# Patient Record
Sex: Male | Born: 1960 | Race: White | Hispanic: No | Marital: Single | Smoking: Former smoker
Health system: Southern US, Community
[De-identification: ages and names within clinical notes are randomized; demographics above are authoritative.]

## PROBLEM LIST (undated history)

## (undated) DIAGNOSIS — H269 Unspecified cataract: Secondary | ICD-10-CM

## (undated) DIAGNOSIS — D649 Anemia, unspecified: Secondary | ICD-10-CM

## (undated) DIAGNOSIS — Z87438 Personal history of other diseases of male genital organs: Secondary | ICD-10-CM

## (undated) DIAGNOSIS — I10 Essential (primary) hypertension: Secondary | ICD-10-CM

## (undated) DIAGNOSIS — T7840XA Allergy, unspecified, initial encounter: Secondary | ICD-10-CM

## (undated) DIAGNOSIS — K5792 Diverticulitis of intestine, part unspecified, without perforation or abscess without bleeding: Secondary | ICD-10-CM

## (undated) DIAGNOSIS — R51 Headache: Secondary | ICD-10-CM

## (undated) DIAGNOSIS — I071 Rheumatic tricuspid insufficiency: Secondary | ICD-10-CM

## (undated) DIAGNOSIS — G8929 Other chronic pain: Secondary | ICD-10-CM

## (undated) DIAGNOSIS — J45909 Unspecified asthma, uncomplicated: Secondary | ICD-10-CM

## (undated) DIAGNOSIS — G2581 Restless legs syndrome: Secondary | ICD-10-CM

## (undated) DIAGNOSIS — E785 Hyperlipidemia, unspecified: Secondary | ICD-10-CM

## (undated) DIAGNOSIS — M1990 Unspecified osteoarthritis, unspecified site: Secondary | ICD-10-CM

## (undated) DIAGNOSIS — R519 Headache, unspecified: Secondary | ICD-10-CM

## (undated) DIAGNOSIS — K219 Gastro-esophageal reflux disease without esophagitis: Secondary | ICD-10-CM

## (undated) HISTORY — PX: INGUINAL HERNIA REPAIR: SHX194

## (undated) HISTORY — DX: Unspecified asthma, uncomplicated: J45.909

## (undated) HISTORY — DX: Essential (primary) hypertension: I10

## (undated) HISTORY — PX: TENDON REPAIR: SHX5111

## (undated) HISTORY — DX: Other chronic pain: G89.29

## (undated) HISTORY — DX: Unspecified cataract: H26.9

## (undated) HISTORY — DX: Allergy, unspecified, initial encounter: T78.40XA

## (undated) HISTORY — DX: Diverticulitis of intestine, part unspecified, without perforation or abscess without bleeding: K57.92

## (undated) HISTORY — DX: Personal history of other diseases of male genital organs: Z87.438

## (undated) HISTORY — DX: Headache, unspecified: R51.9

## (undated) HISTORY — DX: Hyperlipidemia, unspecified: E78.5

## (undated) HISTORY — DX: Gastro-esophageal reflux disease without esophagitis: K21.9

## (undated) HISTORY — PX: TONSILLECTOMY: SUR1361

## (undated) HISTORY — PX: COLONOSCOPY: SHX174

## (undated) HISTORY — DX: Headache: R51

---

## 2000-04-09 ENCOUNTER — Emergency Department (HOSPITAL_COMMUNITY): Admission: EM | Admit: 2000-04-09 | Discharge: 2000-04-09 | Payer: Self-pay | Admitting: *Deleted

## 2000-04-23 ENCOUNTER — Encounter: Payer: Self-pay | Admitting: Internal Medicine

## 2000-04-23 ENCOUNTER — Emergency Department (HOSPITAL_COMMUNITY): Admission: EM | Admit: 2000-04-23 | Discharge: 2000-04-23 | Payer: Self-pay | Admitting: Internal Medicine

## 2001-06-01 ENCOUNTER — Emergency Department (HOSPITAL_COMMUNITY): Admission: EM | Admit: 2001-06-01 | Discharge: 2001-06-01 | Payer: Self-pay | Admitting: *Deleted

## 2003-12-17 ENCOUNTER — Encounter: Admission: RE | Admit: 2003-12-17 | Discharge: 2003-12-25 | Payer: Self-pay | Admitting: Occupational Medicine

## 2004-08-20 ENCOUNTER — Ambulatory Visit: Payer: Self-pay | Admitting: Internal Medicine

## 2005-06-21 ENCOUNTER — Emergency Department (HOSPITAL_COMMUNITY): Admission: EM | Admit: 2005-06-21 | Discharge: 2005-06-21 | Payer: Self-pay | Admitting: *Deleted

## 2005-07-21 ENCOUNTER — Ambulatory Visit: Payer: Self-pay | Admitting: Internal Medicine

## 2005-08-04 ENCOUNTER — Ambulatory Visit: Payer: Self-pay | Admitting: Emergency Medicine

## 2005-08-10 ENCOUNTER — Encounter: Admission: RE | Admit: 2005-08-10 | Discharge: 2005-08-10 | Payer: Self-pay | Admitting: Occupational Medicine

## 2005-08-31 ENCOUNTER — Ambulatory Visit: Payer: Self-pay | Admitting: Internal Medicine

## 2005-09-08 ENCOUNTER — Ambulatory Visit: Payer: Self-pay | Admitting: Internal Medicine

## 2006-01-06 ENCOUNTER — Ambulatory Visit: Payer: Self-pay | Admitting: Internal Medicine

## 2006-01-30 ENCOUNTER — Ambulatory Visit: Payer: Self-pay | Admitting: Gastroenterology

## 2006-08-17 ENCOUNTER — Ambulatory Visit: Payer: Self-pay | Admitting: Internal Medicine

## 2006-08-30 ENCOUNTER — Ambulatory Visit: Payer: Self-pay | Admitting: Gastroenterology

## 2006-09-22 ENCOUNTER — Ambulatory Visit: Payer: Self-pay | Admitting: Internal Medicine

## 2006-09-26 ENCOUNTER — Ambulatory Visit: Payer: Self-pay | Admitting: Gastroenterology

## 2006-12-28 ENCOUNTER — Encounter: Admission: RE | Admit: 2006-12-28 | Discharge: 2006-12-28 | Payer: Self-pay | Admitting: Internal Medicine

## 2006-12-28 ENCOUNTER — Ambulatory Visit: Payer: Self-pay | Admitting: Internal Medicine

## 2007-03-02 ENCOUNTER — Ambulatory Visit: Payer: Self-pay | Admitting: Internal Medicine

## 2007-06-12 ENCOUNTER — Ambulatory Visit: Payer: Self-pay | Admitting: Internal Medicine

## 2007-06-12 ENCOUNTER — Ambulatory Visit (HOSPITAL_COMMUNITY): Admission: RE | Admit: 2007-06-12 | Discharge: 2007-06-12 | Payer: Self-pay | Admitting: Internal Medicine

## 2007-06-25 ENCOUNTER — Encounter: Payer: Self-pay | Admitting: Internal Medicine

## 2007-06-25 DIAGNOSIS — J309 Allergic rhinitis, unspecified: Secondary | ICD-10-CM | POA: Insufficient documentation

## 2007-06-25 DIAGNOSIS — J45909 Unspecified asthma, uncomplicated: Secondary | ICD-10-CM | POA: Insufficient documentation

## 2007-06-25 DIAGNOSIS — J4520 Mild intermittent asthma, uncomplicated: Secondary | ICD-10-CM | POA: Insufficient documentation

## 2007-06-25 DIAGNOSIS — R51 Headache: Secondary | ICD-10-CM | POA: Insufficient documentation

## 2007-06-25 DIAGNOSIS — Z9889 Other specified postprocedural states: Secondary | ICD-10-CM | POA: Insufficient documentation

## 2007-06-25 DIAGNOSIS — Z87898 Personal history of other specified conditions: Secondary | ICD-10-CM | POA: Insufficient documentation

## 2007-06-25 DIAGNOSIS — R519 Headache, unspecified: Secondary | ICD-10-CM | POA: Insufficient documentation

## 2007-08-20 ENCOUNTER — Telehealth: Payer: Self-pay | Admitting: Internal Medicine

## 2007-08-20 DIAGNOSIS — IMO0002 Reserved for concepts with insufficient information to code with codable children: Secondary | ICD-10-CM | POA: Insufficient documentation

## 2007-08-21 ENCOUNTER — Telehealth: Payer: Self-pay | Admitting: Internal Medicine

## 2007-08-22 ENCOUNTER — Telehealth: Payer: Self-pay | Admitting: Internal Medicine

## 2007-08-27 ENCOUNTER — Telehealth: Payer: Self-pay | Admitting: Internal Medicine

## 2007-08-28 ENCOUNTER — Ambulatory Visit (HOSPITAL_COMMUNITY): Admission: RE | Admit: 2007-08-28 | Discharge: 2007-08-28 | Payer: Self-pay | Admitting: Internal Medicine

## 2007-08-29 ENCOUNTER — Encounter: Payer: Self-pay | Admitting: Internal Medicine

## 2007-08-30 ENCOUNTER — Telehealth: Payer: Self-pay | Admitting: Internal Medicine

## 2007-09-04 ENCOUNTER — Telehealth: Payer: Self-pay | Admitting: Internal Medicine

## 2007-10-13 ENCOUNTER — Encounter: Payer: Self-pay | Admitting: Internal Medicine

## 2007-10-30 ENCOUNTER — Ambulatory Visit: Payer: Self-pay | Admitting: Internal Medicine

## 2007-10-30 DIAGNOSIS — M542 Cervicalgia: Secondary | ICD-10-CM | POA: Insufficient documentation

## 2008-05-02 ENCOUNTER — Telehealth: Payer: Self-pay | Admitting: Internal Medicine

## 2008-06-11 ENCOUNTER — Telehealth: Payer: Self-pay | Admitting: Internal Medicine

## 2008-09-02 ENCOUNTER — Telehealth: Payer: Self-pay | Admitting: Internal Medicine

## 2008-09-03 ENCOUNTER — Ambulatory Visit: Payer: Self-pay | Admitting: Internal Medicine

## 2008-09-22 ENCOUNTER — Telehealth: Payer: Self-pay | Admitting: Internal Medicine

## 2009-07-28 ENCOUNTER — Ambulatory Visit: Payer: Self-pay | Admitting: Internal Medicine

## 2010-05-12 ENCOUNTER — Telehealth: Payer: Self-pay | Admitting: Internal Medicine

## 2010-11-09 NOTE — Progress Notes (Signed)
Summary: RFs  Phone Note Refill Request   Refills Requested: Medication #1:  HYDROCODONE-ACETAMINOPHEN 10-325 MG TABS Take 1/2 tablet by mouth once a day as needed  Medication #2:  MIDRIN 325-65-100 MG  CAPS Take as needed Pharm from [facility] - [phone]  Initial call taken by: Lamar Sprinkles, CMA,  May 12, 2010 12:06 PM  Follow-up for Phone Call        1 hydrocodone/APAP # 60 1 refill  2 ok for as needed refills on midrin Follow-up by: Jacques Navy MD,  May 12, 2010 3:41 PM  Additional Follow-up for Phone Call Additional follow up Details #1::        Called into prescription pad Additional Follow-up by: Lamar Sprinkles, CMA,  May 12, 2010 4:42 PM    Prescriptions: HYDROCODONE-ACETAMINOPHEN 10-325 MG TABS (HYDROCODONE-ACETAMINOPHEN) Take 1/2 tablet by mouth once a day as needed  #60 x 1   Entered by:   Lamar Sprinkles, CMA   Authorized by:   Jacques Navy MD   Signed by:   Lamar Sprinkles, CMA on 05/12/2010   Method used:   Telephoned to ...       [facility]  [address] [phone]* (retail)       [address]       [place], [place]  [address]       Ph: [phone] or [phone]       Fax: [phone]   RxID:   [id number] MIDRIN 325-65-100 MG  CAPS (APAP-ISOMETHEPTENE-DICHLORAL) Take as needed  #45 x 12   Entered by:   Lamar Sprinkles, CMA   Authorized by:   Jacques Navy MD   Signed by:   Lamar Sprinkles, CMA on 05/12/2010   Method used:   Telephoned to ...       [facility]  [address] [phone]* (retail)       [address]       [place], [place]  [address]       Ph: [phone] or [phone]       Fax: [phone]   RxID:   [id number]

## 2011-02-15 ENCOUNTER — Other Ambulatory Visit (HOSPITAL_COMMUNITY): Payer: Self-pay | Admitting: Orthopedic Surgery

## 2011-02-15 DIAGNOSIS — S46919A Strain of unspecified muscle, fascia and tendon at shoulder and upper arm level, unspecified arm, initial encounter: Secondary | ICD-10-CM

## 2011-02-15 DIAGNOSIS — M75100 Unspecified rotator cuff tear or rupture of unspecified shoulder, not specified as traumatic: Secondary | ICD-10-CM

## 2011-02-22 ENCOUNTER — Other Ambulatory Visit (HOSPITAL_COMMUNITY): Payer: Self-pay | Admitting: Orthopedic Surgery

## 2011-02-22 ENCOUNTER — Ambulatory Visit (HOSPITAL_COMMUNITY)
Admission: RE | Admit: 2011-02-22 | Discharge: 2011-02-22 | Disposition: A | Discharge status: Home / Self Care | Payer: 59 | Source: Ambulatory Visit | Attending: Orthopedic Surgery | Admitting: Orthopedic Surgery

## 2011-02-22 DIAGNOSIS — S46919A Strain of unspecified muscle, fascia and tendon at shoulder and upper arm level, unspecified arm, initial encounter: Secondary | ICD-10-CM

## 2011-02-22 DIAGNOSIS — IMO0002 Reserved for concepts with insufficient information to code with codable children: Secondary | ICD-10-CM

## 2011-02-22 DIAGNOSIS — M67919 Unspecified disorder of synovium and tendon, unspecified shoulder: Secondary | ICD-10-CM | POA: Insufficient documentation

## 2011-02-22 DIAGNOSIS — X58XXXA Exposure to other specified factors, initial encounter: Secondary | ICD-10-CM | POA: Insufficient documentation

## 2011-02-22 DIAGNOSIS — M75100 Unspecified rotator cuff tear or rupture of unspecified shoulder, not specified as traumatic: Secondary | ICD-10-CM

## 2011-02-22 DIAGNOSIS — M25519 Pain in unspecified shoulder: Secondary | ICD-10-CM | POA: Insufficient documentation

## 2011-02-22 DIAGNOSIS — M24119 Other articular cartilage disorders, unspecified shoulder: Secondary | ICD-10-CM | POA: Insufficient documentation

## 2011-02-22 DIAGNOSIS — S46819A Strain of other muscles, fascia and tendons at shoulder and upper arm level, unspecified arm, initial encounter: Secondary | ICD-10-CM | POA: Insufficient documentation

## 2011-02-22 DIAGNOSIS — M719 Bursopathy, unspecified: Secondary | ICD-10-CM | POA: Insufficient documentation

## 2011-02-25 NOTE — Assessment & Plan Note (Signed)
[facility]                           PRIMARY CARE OFFICE NOTE   RANDIE, TALLARICO                    MRN:          [id number]  DATE:12/28/2006                            DOB:          May 03, 1961    Cancel this dictation because I keyed it wrong.  Sorry.     Rosalyn Gess Norins, MD  Electronically Signed    MEN/MedQ  DD: 12/28/2006  DT: 12/28/2006  Job #: [id number]

## 2011-02-25 NOTE — Letter (Signed)
Mar 03, 2007    Sanda Linger, CCT, CETT, Chiropodist Noninvasive  Cardiovascular Services  [facility]  [address]  [place], [place] [address]   RE:  [name], [name]  MRN:  [id number]  /  DOB:  1961/01/28   Dear Ms. Ebony Cargo,   I have seen Mr. Andre Turner in the office today.  He has a  longstanding problem with a pruritic skin condition.  He was recently  seen by Dr. Para Skeans from dermatology on January 23, 2007, and  diagnosed with having prurigo nodularis.  This is a condition that is  brought on by picking or scratching wounds on a repeated basis and is  not a communicable or infectious disease.   In the last couple of months, because of some erythema at some of these  wounds, the patient was treated with doxycycline, but no culture-  positive MRSA was determined.  At today's visit, one of his lesions,  which is on his proximal upper extremity, was cultured.  Those results  are pending.  The concern would be for a secondary infection of a  preexisting excoriated lesion.   At this point, I do not believe the patient has an active infection.  Certainly I will notify you as soon as I have culture results back  whether they are positive or negative.   For any questions, please do not hesitate to contact me.    Sincerely,      Rosalyn Gess. Norins, MD  Electronically Signed    MEN/MedQ  DD: 03/03/2007  DT: 03/03/2007  Job #: [id number]   CC:    Dr., [place], [place] [address] [address], [address]

## 2011-02-25 NOTE — Letter (Signed)
December 28, 2006    Dardan A. Terri Piedra, M.D.  [address] [place], [place] [address]   RE:  KALIQ, LEGE  MRN:  [id number]  /  DOB:  September 16, 1961   Dear Merlyn Albert,   Thank you very much for seeing Andre Turner, a 50 year old Caucasian  male, for persistent recurrent erythematous raised ulcerated lesions  that have been distributed over most of his body, including his arms,  chest, and lower extremities, but more pronounced on his buttocks.  The  patient has had low-grade fevers with these in the past, and I did  recently treat him with a course of doxycycline to be sure this was not  a MRSA infection.  Unfortunately, the patient continues to have problems  with these lesions that keep recurring.  He reports that they do become  painful.  They sometimes will spontaneously drain.  They develop an  ulcerated center.   PAST MEDICAL HISTORY:  1. The patient does have a history of migraine headache.  2. He has allergic rhinitis, with sensitivity to grasses.  3. He has reactive airway disease.  4. The patient has had knee surgery in 1995.   CURRENT MEDICATIONS:  1. AcipHex 20 mg daily.  2. Sudafed p.r.n.  3. Midrin p.r.n.  4. Hydrocodone p.r.n.  5. Albuterol MDI p.r.n.   PHYSICIAN ROSTER:  Leslye Peer, MD, for pulmonary, who has seen the  patient for his allergic rhinitis and reactive airway disease.   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  He is single.  He has not had known  TB exposure.  He did have PPD screening as required for all employees.   If I can provide any additional information in regard to Mr. Mooers,  please do not hesitate to contact me.   I very much appreciate your office working him into your schedule on  such short notice.    Sincerely,      Rosalyn Gess. Norins, MD  Electronically Signed    MEN/MedQ  DD: 12/28/2006  DT: 12/28/2006  Job #: [id number]   CC:    Fax to [phone]

## 2011-03-02 ENCOUNTER — Telehealth: Payer: Self-pay

## 2011-03-02 NOTE — Telephone Encounter (Signed)
[facility] Triage Call Report Triage Record Num: [id number] Operator: Sula Rumple Patient Name: Andre Turner Call Date & Time: 03/01/2011 5:47:08PM Patient Phone: [phone] PCP: Patient Gender: Male PCP Fax : Patient DOB: 07-23-1961 Practice Name: [facility] Reason for Call: Pt calling on 03/01/11 states he was to have a refill on Hydrocodone 10-325/ pt states he called last week/pt states the "Web site"states something was called in. Pt to have rotator cuff in 2 wks 03/24/11 . Advised no after hours narcotics ordered. Pt understands and may go to UC or ER. Protocol(s) Used: Medication Question Calls, No Triage (Adults) Recommended Outcome per Protocol: Provide Information or Advice Only Reason for Outcome: Caller has medication question only and triager answers question Care Advice: ~ 03/01/2011 5:52:52PM Page 1 of 1 [email or web address]

## 2011-03-24 ENCOUNTER — Ambulatory Visit (HOSPITAL_BASED_OUTPATIENT_CLINIC_OR_DEPARTMENT_OTHER)
Admission: RE | Admit: 2011-03-24 | Discharge: 2011-03-25 | Disposition: A | Discharge status: Home / Self Care | Payer: 59 | Source: Ambulatory Visit | Attending: Orthopedic Surgery | Admitting: Orthopedic Surgery

## 2011-03-24 DIAGNOSIS — M75120 Complete rotator cuff tear or rupture of unspecified shoulder, not specified as traumatic: Secondary | ICD-10-CM | POA: Insufficient documentation

## 2011-03-24 DIAGNOSIS — M24119 Other articular cartilage disorders, unspecified shoulder: Secondary | ICD-10-CM | POA: Insufficient documentation

## 2011-03-24 DIAGNOSIS — M25819 Other specified joint disorders, unspecified shoulder: Secondary | ICD-10-CM | POA: Insufficient documentation

## 2011-03-24 DIAGNOSIS — K219 Gastro-esophageal reflux disease without esophagitis: Secondary | ICD-10-CM | POA: Insufficient documentation

## 2011-03-24 LAB — POCT I-STAT, CHEM 8
Calcium, Ion: 1.13 mmol/L (ref 1.12–1.32)
Chloride: 105 mEq/L (ref 96–112)
Glucose, Bld: 85 mg/dL (ref 70–99)
HCT: 46 % (ref 39.0–52.0)
TCO2: 24 mmol/L (ref 0–100)

## 2011-03-31 NOTE — Op Note (Signed)
  NAMEMOUSA, PROUT NO.:  [id number]  MEDICAL RECORD NO.:  [id number]  LOCATION:                                 FACILITY:  PHYSICIAN:  Loreta Ave, M.D. DATE OF BIRTH:  [birth year]  DATE OF PROCEDURE:  03/24/2011 DATE OF DISCHARGE:                              OPERATIVE REPORT   PREOPERATIVE DIAGNOSES:  Right shoulder impingement, posterior labral tear.  Marked thinning probable full-thickness tear, rotator cuff.  POSTOPERATIVE DIAGNOSIS:  Right shoulder impingement, posterior labral tear.  Marked thinning probable full-thickness tear, rotator cuff with functional full-thickness cuff tear.  Anterior posterior labral tears. Grade 3 changes acromioclavicular joint.  PROCEDURE:  Right shoulder exam under anesthesia, arthroscopy. Debridement of rotator cuff of labrum.  Bursectomy, acromioplasty coracoacromial ligament release.  Excision of distal clavicle. Arthroscopic excision of rotator cuff repair.  Fiber weave suture x1 and SwiveLock anchors x1.  SURGEON:  Loreta Ave, MD  ASSISTANT:  Zonia Kief, PA present throughout the entire case and necessary for timely completion of the procedure.  ANESTHESIA:  General.  ESTIMATED BLOOD LOSS:  Minimal.  SPECIMEN:  None.  COUNTS:  None.  COMPLICATIONS:  None.  DRESSING:  Soft compressive with sling.  PROCEDURE IN DETAIL:  The patient was brought to the operating room and placed on the operating table in supine position.  After adequate anesthesia had been obtained, shoulder examined.  Full motion, stable shoulder.  Placed in beach-chair position on the shoulder positioner, prepped and draped in usual sterile fashion.  Three portals, anterior, posterior, and lateral.  Arthroscope introduced, shoulder distended and inspected.  Complex tearing, anterior and posterior labral debrided. Biceps tendon, biceps anchor still intact.  Undersurface of cuff appeared to be intact and very thin over the  anterior half of supraspinatus tendon.  Remaining cuff looked good.  From above, there was a functional full-thickness tear, anterior half supraspinatus tendon just down the capsular layer.  This was mobilized, debrided for repair. Tuberosity roughened.  Type 2 acromion and reactive bursitis.  Bursa resected.  Acromioplasty to a type 1 acromion with shaver and high-speed bur.  CA ligament released.  Grade 3 changes of distal clavicle. Periarticular spurs and lateral centimeter of clavicle resected. Adequacy of decompression confirmed and viewed throughout.  Through the lateral portal, a cannula was placed.  The mobilized cuff was captured through the horizontal mattress suture with a scorpion device.  This was then firmly implanted down in the tuberosity with a SwiveLock anchor. Nice firm watertight closure of the cuff was achieved.  Adequacy of decompression confirmed.  Instruments and fluid removed.  Portals closed with nylon.  Sterile compressive dressing applied.  Sling applied. Anesthesia reversed.  Brought to the recovery room.  Tolerated the surgery well.  No complications.     Loreta Ave, M.D.     DFM/MEDQ  D:  03/24/2011  T:  03/25/2011  Job:  [id number]  Electronically Signed by Mckinley Jewel M.D. on 03/31/2011 04:09:47 PM

## 2011-05-27 ENCOUNTER — Ambulatory Visit: Payer: 59 | Admitting: Internal Medicine

## 2011-06-02 ENCOUNTER — Encounter: Payer: Self-pay | Admitting: Internal Medicine

## 2011-06-02 ENCOUNTER — Ambulatory Visit (INDEPENDENT_AMBULATORY_CARE_PROVIDER_SITE_OTHER): Payer: 59 | Admitting: Internal Medicine

## 2011-06-02 DIAGNOSIS — L989 Disorder of the skin and subcutaneous tissue, unspecified: Secondary | ICD-10-CM

## 2011-06-02 DIAGNOSIS — Z13220 Encounter for screening for lipoid disorders: Secondary | ICD-10-CM

## 2011-06-02 DIAGNOSIS — N529 Male erectile dysfunction, unspecified: Secondary | ICD-10-CM

## 2011-06-02 DIAGNOSIS — Z125 Encounter for screening for malignant neoplasm of prostate: Secondary | ICD-10-CM

## 2011-06-02 DIAGNOSIS — R635 Abnormal weight gain: Secondary | ICD-10-CM

## 2011-06-02 LAB — CBC
HCT: 41.6 % (ref 39.0–52.0)
Hemoglobin: 13.8 g/dL (ref 13.0–17.0)
MCH: 32.2 pg (ref 26.0–34.0)
MCHC: 33.2 g/dL (ref 30.0–36.0)

## 2011-06-02 LAB — LIPID PANEL: Total CHOL/HDL Ratio: 6.6 Ratio

## 2011-06-02 LAB — HEPATIC FUNCTION PANEL
Bilirubin, Direct: 0.1 mg/dL (ref 0.0–0.3)
Total Bilirubin: 0.4 mg/dL (ref 0.3–1.2)

## 2011-06-02 LAB — PSA: PSA: 0.73 ng/mL (ref <=4.00)

## 2011-06-02 MED ORDER — DOXYCYCLINE HYCLATE 100 MG PO TABS: 100.0000 mg | ORAL_TABLET | Freq: Two times a day (BID) | ORAL | Status: AC

## 2011-06-02 MED ORDER — VARDENAFIL HCL 20 MG PO TABS: ORAL_TABLET | ORAL | Status: DC

## 2011-06-03 ENCOUNTER — Telehealth: Payer: Self-pay | Admitting: Internal Medicine

## 2011-06-03 DIAGNOSIS — E782 Mixed hyperlipidemia: Secondary | ICD-10-CM

## 2011-06-03 NOTE — Telephone Encounter (Signed)
Patient is requesting lab results.

## 2011-06-05 DIAGNOSIS — N529 Male erectile dysfunction, unspecified: Secondary | ICD-10-CM | POA: Insufficient documentation

## 2011-06-05 DIAGNOSIS — L989 Disorder of the skin and subcutaneous tissue, unspecified: Secondary | ICD-10-CM | POA: Insufficient documentation

## 2011-06-05 NOTE — Assessment & Plan Note (Signed)
?   Infectious etiology and with h/o mrsa attempt 2wk course of doxycycline. Consider dermatology re-evaluation if fails to improve.

## 2011-06-05 NOTE — Progress Notes (Signed)
  Subjective:    Patient ID: Andre Turner, male    DOB: 1960-11-03, 50 y.o.   MRN: [id number]  HPI Pt presents to clinic to establish care and for followup of multiple medical problems. Has h/o chronic intermittent erythematous papules located arms and buttocks. Seen by derm in the past and attempted unspecified creams without improvement. States h/o mrsa and believes the papules may have improved at one point with abx tx. S/p rotator cuff surgery in June and states is doing well. Father had prostate cancer and he is personally interested in screening. Believes last colonoscopy ?~5years ago and may be due for repeat but will need to obtain records for review. No abd pain or blood in stool. C/o inadequate erections with intact libido. Has attempted viagra in the past without adverse effect. No other complaint.  Past Medical History  Diagnosis Date  . Unspecified asthma   . Chronic headache   . Allergic rhinitis   . History of benign prostatic hypertrophy    Past Surgical History  Procedure Date  . Inguinal hernia repair   . Knee arthroscopy     reports that he has quit smoking. He has never used smokeless tobacco. He reports that he drinks alcohol. He reports that he does not use illicit drugs. family history includes Colon cancer in his maternal grandmother; Diabetes in his other; Heart failure in his father; Hypertension in his father; Leukemia in his father; Lung cancer in his mother; and Prostate cancer in his father and other. Allergies  Allergen Reactions  . Sulfonamide Derivatives     REACTION: causes nausea       Review of Systems see hpi     Objective:   Physical Exam  Physical Exam  Nursing note and vitals reviewed. Constitutional: Appears well-developed and well-nourished. No distress.  HENT:  Head: Normocephalic and atraumatic.  Right Ear: External ear normal.  Left Ear: External ear normal.  Eyes: Conjunctivae are normal. No scleral icterus.  Neck: Neck  supple. Carotid bruit is not present.  Cardiovascular: Normal rate, regular rhythm and normal heart sounds.  Exam reveals no gallop and no friction rub.   No murmur heard. Pulmonary/Chest: Effort normal and breath sounds normal. No respiratory distress. He has no wheezes. no rales.  Lymphadenopathy:    He has no cervical adenopathy.  Neurological:Alert.  Skin: Skin is warm and dry. Not diaphoretic. no recent lesions to exam. Older lesions appear as slightly raised papules slightly erythematous without drainage or tenderness. Psychiatric: Has a normal mood and affect.        Assessment & Plan:

## 2011-06-05 NOTE — Assessment & Plan Note (Signed)
Attempt levitra 20mg  prn. Dosing instructions provided.

## 2011-06-06 NOTE — Telephone Encounter (Signed)
Chol especially TG elevated. Low fat diet, regular exercise and daily fish oil may help. Repeat lipid in 3 months 272.4. If not much better then will discuss potential medication

## 2011-06-07 NOTE — Telephone Encounter (Signed)
Call placed to patient at [phone], no answer.  A detailed voice message was left informing patient per Dr Rodena Medin instruction. Lab order entered for [facility] for the month of November.

## 2011-06-08 ENCOUNTER — Telehealth: Payer: Self-pay | Admitting: Internal Medicine

## 2011-06-08 NOTE — Telephone Encounter (Signed)
Patient is requesting last uric acid and psa levels.

## 2011-06-09 NOTE — Telephone Encounter (Signed)
Notified most recent PSA of 06/02/11 was normal and we do not have record of any uric acid level. Pt voices understanding.

## 2011-06-23 ENCOUNTER — Telehealth: Payer: Self-pay | Admitting: Internal Medicine

## 2011-06-23 DIAGNOSIS — R12 Heartburn: Secondary | ICD-10-CM

## 2011-06-23 NOTE — Telephone Encounter (Signed)
Cannot find record within epic or centricity. ?

## 2011-06-23 NOTE — Telephone Encounter (Signed)
Patient called to let Dr Rodena Medin know that Dr Russella Dar with [facility] did his colonoscopy

## 2011-06-24 ENCOUNTER — Telehealth: Payer: Self-pay | Admitting: Internal Medicine

## 2011-06-24 NOTE — Telephone Encounter (Signed)
Patient states that he would like to be referred to Dr. Russella Dar for colonoscopy and endoscopy(?). Patient states that he has seen Dr. Russella Dar before for the colonoscopy.

## 2011-06-24 NOTE — Telephone Encounter (Signed)
Call placed to patient at [phone], no answer. A voice message was left for patient to return call regarding the date and location he had his colonoscopy performed.

## 2011-06-28 NOTE — Telephone Encounter (Signed)
EGD can't be ordered directly. Needs to be GI consult. Would generally recommend trying medication for 4 wks first if not taking (no med on chart). Omeprazole 40mg  po qhs. If doesn't resolve with medication then GI/EGD reasonable. Don't mind referring him for that or colonoscopy but can't find colon report in epic or centricity so can't verify he is due. Also GI typically contacts pt directly when due for repeat colon.

## 2011-06-28 NOTE — Telephone Encounter (Signed)
Patient returned phone call,stating his colonoscopy was done about 5-6 years ago by Dr Russella Dar.  He stated that he continues to have a burning sensation in his chest when he lays down and would like to have a endoscopy done .

## 2011-06-29 MED ORDER — OMEPRAZOLE 40 MG PO CPDR: 40.0000 mg | DELAYED_RELEASE_CAPSULE | Freq: Every day | ORAL | Status: DC

## 2011-06-29 NOTE — Telephone Encounter (Signed)
Call placed to patient [phone], he was advised per Dr Rodena Medin instructions, has verbalized understanding and agrees as instructed.  Rx sent to pharmacy.

## 2011-07-04 ENCOUNTER — Telehealth: Payer: Self-pay | Admitting: *Deleted

## 2011-07-04 ENCOUNTER — Other Ambulatory Visit: Payer: Self-pay | Admitting: Internal Medicine

## 2011-07-04 NOTE — Telephone Encounter (Signed)
Will schedule cardiology consult - ED if sx's worsen

## 2011-07-04 NOTE — Telephone Encounter (Signed)
Pt states he has noticed an increase in episodes of shortness of breath with some chest pain while he is walking and notes some wheezing during these episodes. Pt denies n/v, radiating pain or diaphoresis. Reports that symptoms resolve if he stops for a few minutes. States that these symptoms have been occurring x 6 months and seem to be more frequent. Pt states he had an echo cardiogram earlier this year. States that he is not able to get a disc but states the report was normal. 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 He is requesting a second opinion. Please advise.

## 2011-07-05 NOTE — Telephone Encounter (Signed)
Left detailed message on voicemail re: instructions below and to call if any questions. 

## 2011-07-05 NOTE — Telephone Encounter (Signed)
Left message on machine to return my call. 

## 2011-07-05 NOTE — Telephone Encounter (Signed)
Patient returned phone call. Best # [phone]

## 2011-07-06 ENCOUNTER — Ambulatory Visit: Payer: 59 | Admitting: Cardiology

## 2011-07-12 ENCOUNTER — Encounter: Payer: Self-pay | Admitting: Cardiology

## 2011-07-13 ENCOUNTER — Encounter: Payer: Self-pay | Admitting: Cardiology

## 2011-07-13 ENCOUNTER — Ambulatory Visit (INDEPENDENT_AMBULATORY_CARE_PROVIDER_SITE_OTHER)
Admission: RE | Admit: 2011-07-13 | Discharge: 2011-07-13 | Disposition: A | Discharge status: Home / Self Care | Payer: 59 | Source: Ambulatory Visit | Attending: Cardiology | Admitting: Cardiology

## 2011-07-13 ENCOUNTER — Encounter: Payer: Self-pay | Admitting: *Deleted

## 2011-07-13 ENCOUNTER — Ambulatory Visit (INDEPENDENT_AMBULATORY_CARE_PROVIDER_SITE_OTHER): Payer: 59 | Admitting: Cardiology

## 2011-07-13 DIAGNOSIS — I10 Essential (primary) hypertension: Secondary | ICD-10-CM | POA: Insufficient documentation

## 2011-07-13 DIAGNOSIS — R079 Chest pain, unspecified: Secondary | ICD-10-CM | POA: Insufficient documentation

## 2011-07-13 DIAGNOSIS — E785 Hyperlipidemia, unspecified: Secondary | ICD-10-CM

## 2011-07-13 DIAGNOSIS — Z01810 Encounter for preprocedural cardiovascular examination: Secondary | ICD-10-CM

## 2011-07-13 LAB — BASIC METABOLIC PANEL
BUN: 17 mg/dL (ref 6–23)
Calcium: 9 mg/dL (ref 8.4–10.5)
GFR: 76.61 mL/min (ref >60.00)
Glucose, Bld: 98 mg/dL (ref 70–99)
Potassium: 4 mEq/L (ref 3.5–5.1)

## 2011-07-13 LAB — CBC WITH DIFFERENTIAL/PLATELET
Basophils Relative: 1 % (ref 0.0–3.0)
Eosinophils Relative: 1.6 % (ref 0.0–5.0)
HCT: 42.2 % (ref 39.0–52.0)
Hemoglobin: 14.4 g/dL (ref 13.0–17.0)
Lymphs Abs: 2.8 10*3/uL (ref 0.7–4.0)
MCV: 97.9 fl (ref 78.0–100.0)
Monocytes Absolute: 0.6 10*3/uL (ref 0.1–1.0)
RBC: 4.31 Mil/uL (ref 4.22–5.81)
WBC: 7 10*3/uL (ref 4.5–10.5)

## 2011-07-13 NOTE — Assessment & Plan Note (Signed)
Blood pressure controlled. Continue present medications. 

## 2011-07-13 NOTE — Patient Instructions (Signed)
Your physician has requested that you have a cardiac catheterization. Cardiac catheterization is used to diagnose and/or treat various heart conditions. Doctors may recommend this procedure for a number of different reasons. The most common reason is to evaluate chest pain. Chest pain can be a symptom of coronary artery disease (CAD), and cardiac catheterization can show whether plaque is narrowing or blocking your heart's arteries. This procedure is also used to evaluate the valves, as well as measure the blood flow and oxygen levels in different parts of your heart. For further information please visit [email or web address] Please follow instruction sheet, as given.   Your physician recommends that you return for lab work in: today  A chest x-ray takes a picture of the organs and structures inside the chest, including the heart, lungs, and blood vessels. This test can show several things, including, whether the heart is enlarges; whether fluid is building up in the lungs; and whether pacemaker / defibrillator leads are still in place. AT [address]  START ASPIRIN 81 MG ONCE DAILY

## 2011-07-13 NOTE — Progress Notes (Signed)
HPI: 50 yo male with no prior cardiac history for evaluation of chest pain and dyspnea. Patient states that for the past 2 years he has had chest tightness. He notices this with exertion and is relieved with rest. There is also dyspnea on exertion. No orthopnea, PND or syncope. His pain does not radiate. There is no nausea but there is occasional diaphoresis. His symptoms have worsened. Because of the above we were asked to further evaluate.  Current Outpatient Prescriptions  Medication Sig Dispense Refill  . HYDROcodone-acetaminophen (NORCO) 5-325 MG per tablet Take 1 tablet by mouth every 8 (eight) hours as needed.        . hydrOXYzine (ATARAX) 10 MG tablet Take 10 mg by mouth every 8 (eight) hours as needed.        . isometheptene-acetaminophen-dichloralphenazone (MIDRIN) 65-325-100 MG capsule Take 1 capsule by mouth 4 (four) times daily as needed.        [name] omeprazole (PRILOSEC) 40 MG capsule Take 1 capsule (40 mg total) by mouth at bedtime.  30 capsule  1  . propranolol (INDERAL) 40 MG tablet Take 40 mg by mouth 2 (two) times daily.          Allergies  Allergen Reactions  . Celebrex (Celecoxib) Itching  . Sulfonamide Derivatives     REACTION: causes nausea    Past Medical History  Diagnosis Date  . Unspecified asthma   . Chronic headache   . Allergic rhinitis   . History of benign prostatic hypertrophy   . Hypertension   . Hyperlipidemia   . GERD (gastroesophageal reflux disease)     Past Surgical History  Procedure Date  . Inguinal hernia repair   . Knee arthroscopy   . Tonsillectomy     History   Social History  . Marital Status: Single    Spouse Name: N/A    Number of Children: N/A  . Years of Education: N/A   Occupational History  . Not on file.   Social History Main Topics  . Smoking status: Former Games developer  . Smokeless tobacco: Current User    Types: Chew   Comment: stopped at age 43  . Alcohol Use: Yes     2 drinks per day  . Drug Use: No  . Sexually  Active: Not on file   Other Topics Concern  . Not on file   Social History Narrative   HSG, working on college degree   single. Was employed at [employer] until December.    Family History  Problem Relation Age of Onset  . Prostate cancer Father   . Hypertension Father   . Heart failure Father   . Leukemia Father   . Lung cancer Mother   . Colon cancer Maternal Grandmother   . Diabetes Other     FH- maternal side  . Prostate cancer Other     FH-maternal side    ROS: no fevers or chills, productive cough, hemoptysis, dysphasia, odynophagia, melena, hematochezia, dysuria, hematuria, rash, seizure activity, orthopnea, PND, pedal edema, claudication. Remaining systems are negative.  Physical Exam: General:  Well developed/well nourished in NAD Skin warm/dry Patient not depressed No peripheral clubbing Back-normal HEENT-normal/normal eyelids Neck supple/normal carotid upstroke bilaterally; no bruits; no JVD; no thyromegaly chest - CTA/ normal expansion CV - RRR/normal S1 and S2; no murmurs, rubs or gallops;  PMI nondisplaced Abdomen -NT/ND, no HSM, no mass, + bowel sounds, no bruit 2+ femoral pulses, no bruits Ext-no edema, chords, 2+ DP Neuro-grossly nonfocal  ECG NSR  with no ST changes

## 2011-07-13 NOTE — Assessment & Plan Note (Signed)
Add statin and coronary disease demonstrated. Otherwise management per primary care.

## 2011-07-13 NOTE — Assessment & Plan Note (Signed)
Symptoms are concerning for angina. They have progressed. Feel definitive evaluation is warranted. Proceed with cardiac catheterization. Risks and benefits including but not limited to myocardial infarction, CVA and death discussed and patient agreed to proceed. Continue beta blocker. Add aspirin. If coronary artery disease demonstrated will add statin.

## 2011-07-14 NOTE — Progress Notes (Signed)
Addended by: Kem Parkinson on: 07/14/2011 03:30 PM   Modules accepted: Orders

## 2011-07-19 ENCOUNTER — Inpatient Hospital Stay (HOSPITAL_BASED_OUTPATIENT_CLINIC_OR_DEPARTMENT_OTHER)
Admission: RE | Admit: 2011-07-19 | Discharge: 2011-07-19 | Disposition: A | Discharge status: Home / Self Care | Payer: 59 | Source: Ambulatory Visit | Attending: Cardiology | Admitting: Cardiology

## 2011-07-19 ENCOUNTER — Telehealth: Payer: Self-pay | Admitting: Cardiology

## 2011-07-19 ENCOUNTER — Ambulatory Visit: Payer: 59 | Admitting: Cardiology

## 2011-07-19 DIAGNOSIS — E785 Hyperlipidemia, unspecified: Secondary | ICD-10-CM | POA: Insufficient documentation

## 2011-07-19 DIAGNOSIS — I10 Essential (primary) hypertension: Secondary | ICD-10-CM | POA: Insufficient documentation

## 2011-07-19 DIAGNOSIS — R079 Chest pain, unspecified: Secondary | ICD-10-CM

## 2011-07-19 DIAGNOSIS — I2510 Atherosclerotic heart disease of native coronary artery without angina pectoris: Secondary | ICD-10-CM | POA: Insufficient documentation

## 2011-07-19 DIAGNOSIS — R0789 Other chest pain: Secondary | ICD-10-CM | POA: Insufficient documentation

## 2011-07-19 DIAGNOSIS — R0989 Other specified symptoms and signs involving the circulatory and respiratory systems: Secondary | ICD-10-CM | POA: Insufficient documentation

## 2011-07-19 DIAGNOSIS — R0609 Other forms of dyspnea: Secondary | ICD-10-CM | POA: Insufficient documentation

## 2011-07-19 NOTE — Telephone Encounter (Signed)
Pt had a chest xray  2 wks ago and hasnt heard results please call

## 2011-07-19 NOTE — Cardiovascular Report (Signed)
  Andre Turner, Andre Turner NO.:  [id number]  MEDICAL RECORD NO.:  [id number]  LOCATION:                                 FACILITY:  PHYSICIAN:  Ciro Tashiro M. [place], M.D.  DATE OF BIRTH:  07-06-1961  DATE OF PROCEDURE:  07/19/2011 DATE OF DISCHARGE:                           CARDIAC CATHETERIZATION   INDICATION FOR PROCEDURE:  A 50 year old white male who presents with progressive symptoms of chest tightness on exertion associated with dyspnea.  He has a history of hypertension and hyperlipidemia.  PROCEDURES:  Left heart catheterization, coronary and left ventricular angiography.  ACCESS:  Via the right femoral artery using standard Seldinger technique.  EQUIPMENT:  4-French 4-cm left Judkins catheter, 4-French 3D RCA catheter, 4-French pigtail catheter, 4-French arterial sheath.  MEDICATIONS:  Local.  ANESTHESIA:  1% Xylocaine, Versed 2 mg IV.  CONTRAST:  80 mL of Omnipaque.  HEMODYNAMIC DATA:  Aortic pressure was 103/66 with a mean of 85 mmHg. Left ventricular pressure was 99 with an EDP of 18 mmHg.  ANGIOGRAPHIC DATA:  The left coronary artery arises and distributes normally.  The left main coronary artery is normal.  Left anterior descending artery is normal.  Left circumflex coronary artery is normal.  The right coronary artery is a large dominant vessel.  It has 20% narrowing in the midvessel.  Otherwise, it is without significant disease.  Left ventricular angiography was performed in the RAO view.  This demonstrates normal left ventricular size and contractility with normal systolic function.  Ejection fraction was estimated at 60%.  FINAL INTERPRETATION: 1. Minimal nonobstructive atherosclerotic coronary artery disease in     the right coronary artery. 2. Normal left ventricular function.  PLAN:  Continue medical therapy.          ______________________________ Aily Tzeng M. [place], M.D.     PMJ/MEDQ  D:  07/19/2011  T:  07/19/2011   Job:  [id number]  cc:   Madolyn Frieze. Jens Som, MD, [facility] [address], MD  Electronically Signed by Poseidon Pam [place] M.D. on 07/19/2011 01:04:52 PM

## 2011-07-21 NOTE — Telephone Encounter (Signed)
Spoke with pt, aware of xray report Andre Turner

## 2011-07-26 ENCOUNTER — Encounter: Payer: Self-pay | Admitting: *Deleted

## 2011-08-03 ENCOUNTER — Encounter: Payer: Self-pay | Admitting: Family

## 2011-08-03 ENCOUNTER — Ambulatory Visit (INDEPENDENT_AMBULATORY_CARE_PROVIDER_SITE_OTHER): Payer: 59 | Admitting: Family

## 2011-08-03 ENCOUNTER — Ambulatory Visit (HOSPITAL_BASED_OUTPATIENT_CLINIC_OR_DEPARTMENT_OTHER)
Admission: RE | Admit: 2011-08-03 | Discharge: 2011-08-03 | Disposition: A | Discharge status: Home / Self Care | Payer: 59 | Source: Ambulatory Visit | Attending: Family | Admitting: Family

## 2011-08-03 ENCOUNTER — Ambulatory Visit: Payer: 59 | Admitting: Family

## 2011-08-03 VITALS — BP 110/76 | HR 81 | Temp 98.0°F | Resp 18 | Ht 68.0 in | Wt 197.1 lb

## 2011-08-03 DIAGNOSIS — R509 Fever, unspecified: Secondary | ICD-10-CM | POA: Insufficient documentation

## 2011-08-03 DIAGNOSIS — R059 Cough, unspecified: Secondary | ICD-10-CM

## 2011-08-03 DIAGNOSIS — J209 Acute bronchitis, unspecified: Secondary | ICD-10-CM | POA: Insufficient documentation

## 2011-08-03 DIAGNOSIS — R05 Cough: Secondary | ICD-10-CM

## 2011-08-03 MED ORDER — PREDNISONE 10 MG PO TABS: ORAL_TABLET | ORAL | Status: DC

## 2011-08-03 MED ORDER — ALBUTEROL SULFATE HFA 108 (90 BASE) MCG/ACT IN AERS: 2.0000 | INHALATION_SPRAY | Freq: Four times a day (QID) | RESPIRATORY_TRACT | Status: DC | PRN

## 2011-08-03 MED ORDER — ALBUTEROL SULFATE (2.5 MG/3ML) 0.083% IN NEBU
2.5000 mg | INHALATION_SOLUTION | Freq: Once | RESPIRATORY_TRACT | Status: AC
  Administered 2011-08-03: 2.5 mg via RESPIRATORY_TRACT

## 2011-08-03 MED ORDER — DOXYCYCLINE HYCLATE 100 MG PO CAPS: 100.0000 mg | ORAL_CAPSULE | Freq: Two times a day (BID) | ORAL | Status: DC

## 2011-08-03 NOTE — Progress Notes (Signed)
Subjective:    Patient ID: Andre Turner, male    DOB: [birth year], 50 y.o.   MRN: [id number]  HPI  Andre Turner is a 50 yr old male with chief complaint of cough. Cough is productive of "bad tasting" green sputum.  Symptoms started 2.5 days ago and are associated with fever, sneezing, ear pain, malaise, nausea, wheezing/shortness of breath, green nasal discharge and sore throat. Has tried Cepacol, Vodka shot, salt water gargles, advil cold/sinus, antihistamine/decongestant, and his dog's cephalexin ?250mg , without improvement.     Review of Systems See HPI  Past Medical History  Diagnosis Date  . Unspecified asthma   . Chronic headache   . Allergic rhinitis   . History of benign prostatic hypertrophy   . Hypertension   . Hyperlipidemia   . GERD (gastroesophageal reflux disease)     History   Social History  . Marital Status: Single    Spouse Name: N/A    Number of Children: N/A  . Years of Education: N/A   Occupational History  . Not on file.   Social History Main Topics  . Smoking status: Former Games developer  . Smokeless tobacco: Current User    Types: Chew   Comment: stopped at age 1  . Alcohol Use: Yes     2 drinks per day  . Drug Use: No  . Sexually Active: Not on file   Other Topics Concern  . Not on file   Social History Narrative   HSG, working on college degree   single. Was employed at [employer] until December.    Past Surgical History  Procedure Date  . Inguinal hernia repair   . Knee arthroscopy   . Tonsillectomy     Family History  Problem Relation Age of Onset  . Prostate cancer Father   . Hypertension Father   . Heart failure Father   . Leukemia Father   . Lung cancer Mother   . Colon cancer Maternal Grandmother   . Diabetes Other     FH- maternal side  . Prostate cancer Other     FH-maternal side    Allergies  Allergen Reactions  . Celebrex (Celecoxib) Itching  . Sulfonamide Derivatives     REACTION: causes nausea    Current  Outpatient Prescriptions on File Prior to Visit  Medication Sig Dispense Refill  . HYDROcodone-acetaminophen (NORCO) 5-325 MG per tablet Take 1 tablet by mouth every 8 (eight) hours as needed.        . hydrOXYzine (ATARAX) 10 MG tablet Take 10 mg by mouth every 8 (eight) hours as needed.        . isometheptene-acetaminophen-dichloralphenazone (MIDRIN) 65-325-100 MG capsule Take 1 capsule by mouth 4 (four) times daily as needed.        . propranolol (INDERAL) 40 MG tablet Take 40 mg by mouth 2 (two) times daily.        [name] omeprazole (PRILOSEC) 40 MG capsule Take 1 capsule (40 mg total) by mouth at bedtime.  30 capsule  1    BP 110/76  Pulse 81  Temp(Src) 98 F (36.7 C) (Oral)  Resp 18  Ht 5\' 8"  (1.727 m)  Wt 197 lb 1.3 oz (89.395 kg)  BMI 29.97 kg/m2  SpO2 96%       Objective:   Physical Exam  Constitutional: He appears well-developed and well-nourished. No distress.       Tired appearing white male in NAD  HENT:  Right Ear: Tympanic membrane and ear canal  normal.  Left Ear: Ear canal normal.  Mouth/Throat: Uvula is midline, oropharynx is clear and moist and mucous membranes are normal.  Cardiovascular: Normal rate and regular rhythm.   No murmur heard. Pulmonary/Chest:       Faint expiratory wheeze. +coughing, no increased work of breathing.  Lymphadenopathy:    He has no cervical adenopathy.  Skin: Skin is warm and dry.  Psychiatric: He has a normal mood and affect. His behavior is normal. Judgment and thought content normal.          Assessment & Plan:

## 2011-08-03 NOTE — Assessment & Plan Note (Signed)
Chest x-ray performed today is negative. He was given an albuterol neb today which he tells me did help.  Will plan to treat with doxy for a full 10 days for bronchitis as well as sinus coverage.  He will also be given a prednisone taper as I believe that bronchospasm is playing a role in his coughing and breathing tightness.  He is instructed to follow up with Dr. Rodena Medin in 1 week.

## 2011-08-03 NOTE — Patient Instructions (Signed)
Please call if you have recurrent fever >101, if symptoms worsen, or if you are not feeling better in 2-3 days.

## 2011-08-09 ENCOUNTER — Ambulatory Visit: Payer: 59 | Admitting: Internal Medicine

## 2011-08-09 ENCOUNTER — Encounter: Payer: Self-pay | Admitting: Internal Medicine

## 2011-08-09 ENCOUNTER — Ambulatory Visit (INDEPENDENT_AMBULATORY_CARE_PROVIDER_SITE_OTHER): Payer: 59 | Admitting: Internal Medicine

## 2011-08-09 DIAGNOSIS — J40 Bronchitis, not specified as acute or chronic: Secondary | ICD-10-CM | POA: Insufficient documentation

## 2011-08-09 MED ORDER — LEVOFLOXACIN 500 MG PO TABS: 500.0000 mg | ORAL_TABLET | Freq: Every day | ORAL | Status: DC

## 2011-08-09 NOTE — Assessment & Plan Note (Signed)
Improved but not resolved. Attempt second course of abx with levaquin 500mg  po qd x 7d. Followup if no improvement or worsening.

## 2011-08-09 NOTE — Progress Notes (Signed)
  Subjective:    Patient ID: Andre Turner, male    DOB: [birth year], 50 y.o.   MRN: [id number]  HPI Pt presents to clinic for evaluation of cough. Seen 10/4 with productive cough. Has completed 10d course of doxycycline with some improvement but no resolution. CXR at the time without acute dz. Currently has cough productive for green sputum and intermittent streaky hemoptysis. No gross active bleeding. Mild DOE. Intermittent fevers. Using albuterol mdi prn. No other alleviating or exacerbating factors. No other complaints.  Past Medical History  Diagnosis Date  . Unspecified asthma   . Chronic headache   . Allergic rhinitis   . History of benign prostatic hypertrophy   . Hypertension   . Hyperlipidemia   . GERD (gastroesophageal reflux disease)    Past Surgical History  Procedure Date  . Inguinal hernia repair   . Knee arthroscopy   . Tonsillectomy     reports that he has quit smoking. His smokeless tobacco use includes Chew. He reports that he drinks alcohol. He reports that he does not use illicit drugs. family history includes Colon cancer in his maternal grandmother; Diabetes in his other; Heart failure in his father; Hypertension in his father; Leukemia in his father; Lung cancer in his mother; and Prostate cancer in his father and other. Allergies  Allergen Reactions  . Celebrex (Celecoxib) Itching  . Sulfonamide Derivatives     REACTION: causes nausea     Review of Systems see hpi     Objective:   Physical Exam  Nursing note and vitals reviewed. Constitutional: He appears well-developed and well-nourished. No distress.  HENT:  Head: Normocephalic and atraumatic.  Right Ear: External ear normal.  Left Ear: External ear normal.  Nose: Nose normal.  Mouth/Throat: Oropharynx is clear and moist. No oropharyngeal exudate.  Eyes: Conjunctivae are normal. No scleral icterus.  Neck: Neck supple.  Cardiovascular: Normal rate, regular rhythm and normal heart sounds.  Exam  reveals no gallop and no friction rub.   No murmur heard. Pulmonary/Chest: Effort normal and breath sounds normal. No respiratory distress. He has no wheezes. He has no rales.  Lymphadenopathy:    He has no cervical adenopathy.  Neurological: He is alert.  Skin: Skin is warm and dry. He is not diaphoretic.  Psychiatric: He has a normal mood and affect.          Assessment & Plan:

## 2011-08-16 ENCOUNTER — Ambulatory Visit (INDEPENDENT_AMBULATORY_CARE_PROVIDER_SITE_OTHER): Payer: 59 | Admitting: Cardiology

## 2011-08-16 ENCOUNTER — Encounter: Payer: Self-pay | Admitting: Cardiology

## 2011-08-16 DIAGNOSIS — E785 Hyperlipidemia, unspecified: Secondary | ICD-10-CM

## 2011-08-16 DIAGNOSIS — R079 Chest pain, unspecified: Secondary | ICD-10-CM

## 2011-08-16 DIAGNOSIS — I10 Essential (primary) hypertension: Secondary | ICD-10-CM

## 2011-08-16 NOTE — Assessment & Plan Note (Signed)
Management per primary care. 

## 2011-08-16 NOTE — Progress Notes (Signed)
HPI:50 yo male with no prior cardiac history I initially saw in Oct 2012 for evaluation of chest pain and dyspnea. Cardiac cath in Oct of 2012 revealed Normal LV function and a 20% RCA and no other disease; since that time, the patient denies any dyspnea on exertion, orthopnea, PND, pedal edema, palpitations, syncope or chest pain.   Current Outpatient Prescriptions  Medication Sig Dispense Refill  . albuterol (PROVENTIL HFA;VENTOLIN HFA) 108 (90 BASE) MCG/ACT inhaler Inhale 2 puffs into the lungs every 6 (six) hours as needed for wheezing.  1 Inhaler  0  . Cod Liver Oil CAPS Take 1 capsule by mouth daily.        . hydrOXYzine (ATARAX) 10 MG tablet Take 10 mg by mouth every 8 (eight) hours as needed.        . isometheptene-acetaminophen-dichloralphenazone (MIDRIN) 65-325-100 MG capsule Take 1 capsule by mouth 4 (four) times daily as needed.        . propranolol (INDERAL) 40 MG tablet 1/2 tablet by mouth twice a day         Past Medical History  Diagnosis Date  . Unspecified asthma   . Chronic headache   . Allergic rhinitis   . History of benign prostatic hypertrophy   . Hypertension   . Hyperlipidemia   . GERD (gastroesophageal reflux disease)     Past Surgical History  Procedure Date  . Inguinal hernia repair   . Knee arthroscopy   . Tonsillectomy     History   Social History  . Marital Status: Single    Spouse Name: N/A    Number of Children: N/A  . Years of Education: N/A   Occupational History  . Not on file.   Social History Main Topics  . Smoking status: Former Games developer  . Smokeless tobacco: Current User    Types: Chew   Comment: stopped at age 2  . Alcohol Use: Yes     2 drinks per day  . Drug Use: No  . Sexually Active: Not on file   Other Topics Concern  . Not on file   Social History Narrative   HSG, working on college degree   single. Was employed at [employer] until December.    ROS: no fevers or chills, productive cough, hemoptysis, dysphasia,  odynophagia, melena, hematochezia, dysuria, hematuria, rash, seizure activity, orthopnea, PND, pedal edema, claudication. Remaining systems are negative.  Physical Exam: Well-developed well-nourished in no acute distress.  Skin is warm and dry.  HEENT is normal.  Neck is supple. No thyromegaly.  Chest is clear to auscultation with normal expansion.  Cardiovascular exam is regular rate and rhythm.  Abdominal exam nontender or distended. No masses palpated. Right groin with no hematoma and no bruit Extremities show no edema. neuro grossly intact

## 2011-08-16 NOTE — Assessment & Plan Note (Signed)
No further chest pain. Catheterization revealed no obstructive coronary disease. No further workup.

## 2011-08-16 NOTE — Assessment & Plan Note (Signed)
Blood pressure controlled. Continue present medications. 

## 2011-10-19 ENCOUNTER — Encounter: Payer: Self-pay | Admitting: Gastroenterology

## 2011-12-26 ENCOUNTER — Telehealth: Payer: Self-pay | Admitting: *Deleted

## 2011-12-26 NOTE — Telephone Encounter (Signed)
Patient called and left voice message requesting a Rx his respiratory infection.   Call was returned to patient at  [phone]. Patient states he has had wheezing, fever 101-102, sob ,productive cough gray in color for the past few days. Patient was advised to schedule office visit for evaluation. He states that he can not make an appointment for today. He has scheduled for Tuesday 12/27/2011. He stated that if he is unable to keep scheduled appointment he will call back in the morning.

## 2011-12-27 ENCOUNTER — Encounter: Payer: Self-pay | Admitting: Internal Medicine

## 2011-12-27 ENCOUNTER — Ambulatory Visit (INDEPENDENT_AMBULATORY_CARE_PROVIDER_SITE_OTHER): Payer: 59 | Admitting: Internal Medicine

## 2011-12-27 VITALS — BP 104/60 | HR 80 | Temp 97.7°F | Resp 18 | Wt 179.0 lb

## 2011-12-27 DIAGNOSIS — J40 Bronchitis, not specified as acute or chronic: Secondary | ICD-10-CM

## 2011-12-27 MED ORDER — AMOXICILLIN-POT CLAVULANATE 875-125 MG PO TABS: 1.0000 | ORAL_TABLET | Freq: Two times a day (BID) | ORAL | Status: DC

## 2011-12-27 NOTE — Assessment & Plan Note (Signed)
Begin augmentin course to completion. Followup if no improvement or worsening.

## 2011-12-27 NOTE — Progress Notes (Signed)
  Subjective:    Patient ID: Andre Turner, male    DOB: December 07, 1960, 51 y.o.   MRN: [id number]  HPI Pt presents to clinic for evaluation of cough. Notes almost 2wk h/o cough, fever with tmax 101.5 yesterday, and chest congestion. Took aleve for fever. No other alleviating or exacerbating factors. No other complaints.  Past Medical History  Diagnosis Date  . Unspecified asthma   . Chronic headache   . Allergic rhinitis   . History of benign prostatic hypertrophy   . Hypertension   . Hyperlipidemia   . GERD (gastroesophageal reflux disease)    Past Surgical History  Procedure Date  . Inguinal hernia repair   . Knee arthroscopy   . Tonsillectomy     reports that he has quit smoking. His smokeless tobacco use includes Chew. He reports that he drinks alcohol. He reports that he does not use illicit drugs. family history includes Colon cancer in his maternal grandmother; Diabetes in his other; Heart failure in his father; Hypertension in his father; Leukemia in his father; Lung cancer in his mother; and Prostate cancer in his father and other. Allergies  Allergen Reactions  . Celebrex (Celecoxib) Itching  . Sulfonamide Derivatives     REACTION: causes nausea     Review of Systems see hpi     Objective:   Physical Exam  Nursing note and vitals reviewed. Constitutional: He appears well-developed and well-nourished. No distress.  HENT:  Head: Normocephalic and atraumatic.  Right Ear: Tympanic membrane, external ear and ear canal normal.  Left Ear: Tympanic membrane, external ear and ear canal normal.  Nose: Nose normal.  Mouth/Throat: Oropharynx is clear and moist. No oropharyngeal exudate.  Eyes: Conjunctivae are normal. Right eye exhibits no discharge. Left eye exhibits no discharge. No scleral icterus.  Neck: Neck supple.  Cardiovascular: Normal rate, regular rhythm and normal heart sounds.   Pulmonary/Chest: Effort normal and breath sounds normal. No respiratory distress.  He has no wheezes. He has no rales.  Lymphadenopathy:    He has no cervical adenopathy.  Neurological: He is alert.  Skin: Skin is warm and dry. He is not diaphoretic.  Psychiatric: He has a normal mood and affect.          Assessment & Plan:

## 2012-01-03 ENCOUNTER — Telehealth: Payer: Self-pay | Admitting: *Deleted

## 2012-01-03 MED ORDER — AMOXICILLIN-POT CLAVULANATE 875-125 MG PO TABS: 1.0000 | ORAL_TABLET | Freq: Two times a day (BID) | ORAL | Status: AC

## 2012-01-03 NOTE — Telephone Encounter (Signed)
Call placed to patient at [phone], he was informed per Dr Rodena Medin instructions and has verbalized understanding to return if no improvement.

## 2012-01-03 NOTE — Telephone Encounter (Signed)
Patient called and left voice message stating he was asked to call back with a status update. He states he still has congestion ,orange nasal drainage, and his fever is gone.

## 2012-01-03 NOTE — Telephone Encounter (Signed)
Can repeat abx course. F/u appt if that doesn't work

## 2012-04-02 ENCOUNTER — Other Ambulatory Visit: Payer: Self-pay | Admitting: Internal Medicine

## 2012-04-02 MED ORDER — ISOMETHEPTENE-APAP-DICHLORAL 65-325-100 MG PO CAPS: 1.0000 | ORAL_CAPSULE | Freq: Four times a day (QID) | ORAL | Status: DC | PRN

## 2012-04-02 NOTE — Telephone Encounter (Signed)
Patient called and left voice message requesting a refill on midrin and hydrocodone. If approved he is requesting refill to [facility].

## 2012-04-02 NOTE — Telephone Encounter (Signed)
midrin #30 rf 3. Cannot see that the narcotic has been prescribed here. i know had shoulder surgery but that was a year ago. Last two visits were for bronchitis.

## 2012-04-02 NOTE — Telephone Encounter (Signed)
Call placed to patient at [phone], he was informed per Dr Rodena Medin instructions. He was made aware Rx for Hydrocodone was denied. No further action required.

## 2012-07-10 ENCOUNTER — Encounter: Payer: Self-pay | Admitting: Gastroenterology

## 2012-12-06 ENCOUNTER — Ambulatory Visit (INDEPENDENT_AMBULATORY_CARE_PROVIDER_SITE_OTHER): Payer: PRIVATE HEALTH INSURANCE | Admitting: Family Medicine

## 2012-12-06 VITALS — BP 123/67 | HR 89 | Temp 99.9°F | Resp 16 | Ht 68.5 in | Wt 156.0 lb

## 2012-12-06 DIAGNOSIS — J322 Chronic ethmoidal sinusitis: Secondary | ICD-10-CM

## 2012-12-06 MED ORDER — AMOXICILLIN 875 MG PO TABS: 875.0000 mg | ORAL_TABLET | Freq: Two times a day (BID) | ORAL | Status: DC

## 2012-12-06 NOTE — Patient Instructions (Signed)
Take the Amoxicillin twice a day for 10 days.  Take with food.  YOu should start feeling better in about 24 or 48 hours.   If you're still having trouble in a week or so let us know.

## 2012-12-06 NOTE — Progress Notes (Signed)
Andre Turner is a 52 y.o. male who presents to Urgent Care today with complaints of headache and nasal congestion:  1.  Nasal congestion/headache:  Present since Tuesday.  Had fever to 102 at home on that day.  Nasal congestion since that time as well as headaches and facial tenderness.  Describes tenderness across bilateral ethmoid and maxillary sinuses, worse ethmoid.  Has been taking Alleve every 12 hours since then, has chills when medicine wears off.  No sore throat, does have some pain that radiates to back of neck.  No stiff neck.  No sick contacts.  No cough.   PMH reviewed.  Past Medical History  Diagnosis Date  . Unspecified asthma   . Chronic headache   . Allergic rhinitis   . History of benign prostatic hypertrophy   . Hypertension   . Hyperlipidemia   . GERD (gastroesophageal reflux disease)    Past Surgical History  Procedure Laterality Date  . Inguinal hernia repair    . Knee arthroscopy    . Tonsillectomy      Medications reviewed. Current Outpatient Prescriptions  Medication Sig Dispense Refill  . Calcium-Magnesium-Vitamin D (CALCIUM MAGNESIUM PO) Take by mouth.      [name] amoxicillin (AMOXIL) 875 MG tablet Take 1 tablet (875 mg total) by mouth 2 (two) times daily.  20 tablet  0  . [DISCONTINUED] propranolol (INDERAL) 40 MG tablet 1/2 tablet by mouth twice a day       No current facility-administered medications for this visit.    ROS as above otherwise neg.  No chest pain, palpitations, SOB, Fever, Chills, Abd pain, N/V/D.   Physical Exam:  BP 123/67  Pulse 89  Temp(Src) 99.9 F (37.7 C)  Resp 16  Ht 5' 8.5" (1.74 m)  Wt 156 lb (70.761 kg)  BMI 23.37 kg/m2  SpO2 98% Gen:  Patient sitting on exam table, appears stated age in no acute distress Head: Normocephalic atraumatic Eyes: EOMI, PERRL, sclera and conjunctiva non-erythematous Ears:  Canals clear bilaterally.  TMs pearly gray bilaterally without erythema or bulging.   Nose:  Nasal turbinates  grossly enlarged bilaterally. Some exudates noted. Tender to palpation of ethmoid sinuses bilaterally Mouth: Mucosa membranes moist. Tonsils +2, nonenlarged, non-erythematous. Neck: No cervical lymphadenopathy noted.  Supple and nontender.   Heart:  RRR, no murmurs auscultated. Pulm:  Clear to auscultation bilaterally with good air movement.  No wheezes or rales noted.      Assessment and Plan:  1.  Sinusitis: - most likely etiology, along with fever and sinus congestion.  Corroborated by physical examination - FU in 7 days if no improvement, sooner if worsening. - Amoxillin x 10 days.

## 2013-12-12 ENCOUNTER — Ambulatory Visit: Payer: Self-pay | Admitting: Emergency Medicine

## 2013-12-12 VITALS — BP 110/62 | HR 68 | Temp 98.2°F | Resp 18 | Ht 67.5 in | Wt 159.0 lb

## 2013-12-12 DIAGNOSIS — Z0289 Encounter for other administrative examinations: Secondary | ICD-10-CM

## 2013-12-12 LAB — POCT CBC
GRANULOCYTE PERCENT: 47.7 % (ref 37–80)
HCT, POC: 39.8 % — AB (ref 43.5–53.7)
HEMOGLOBIN: 12.6 g/dL — AB (ref 14.1–18.1)
Lymph, poc: 2.9 (ref 0.6–3.4)
MCH: 30.8 pg (ref 27–31.2)
MCHC: 31.7 g/dL — AB (ref 31.8–35.4)
MCV: 97.2 fL — AB (ref 80–97)
MID (CBC): 0.5 (ref 0–0.9)
MPV: 9.4 fL (ref 0–99.8)
PLATELET COUNT, POC: 248 10*3/uL (ref 142–424)
POC Granulocyte: 3.1 (ref 2–6.9)
POC LYMPH PERCENT: 45.2 %L (ref 10–50)
POC MID %: 7.1 % (ref 0–12)
RBC: 4.09 M/uL — AB (ref 4.69–6.13)
RDW, POC: 14 %
WBC: 6.4 10*3/uL (ref 4.6–10.2)

## 2013-12-12 NOTE — Progress Notes (Signed)
Sp gr-1.010, protein-neg, glucose-neg, blood-neg

## 2013-12-12 NOTE — Progress Notes (Signed)
[facility] [address], [place] [address] [phone]  Date:  12/12/2013   Name:  Andre Turner   DOB:  1961-03-01   MRN:  [id number]  PCP:  Letitia Libra, Ala Dach, MD    Chief Complaint: Annual Exam   History of Present Illness:  Andre Turner is a 53 y.o. very pleasant male patient who presents with the following:  For physical examination.  Denies other complaint or health concern today.   Patient Active Problem List   Diagnosis Date Noted  . Bronchitis 08/09/2011  . Chest pain 07/13/2011  . Hypertension 07/13/2011  . Hyperlipidemia 07/13/2011  . Skin lesions, generalized 06/05/2011  . Erectile dysfunction 06/05/2011  . NECK PAIN 10/30/2007  . BACK PAIN WITH RADICULOPATHY 08/20/2007  . ALLERGIC RHINITIS 06/25/2007  . REACTIVE AIRWAY DISEASE 06/25/2007  . HEADACHE, CHRONIC 06/25/2007  . BENIGN PROSTATIC HYPERTROPHY, HX OF 06/25/2007  . Other Postprocedural Status 06/25/2007    Past Medical History  Diagnosis Date  . Unspecified asthma(493.90)   . Chronic headache   . Allergic rhinitis   . History of benign prostatic hypertrophy   . Hypertension   . Hyperlipidemia   . GERD (gastroesophageal reflux disease)     Past Surgical History  Procedure Laterality Date  . Inguinal hernia repair    . Knee arthroscopy    . Tonsillectomy      History  Substance Use Topics  . Smoking status: Former Games developer  . Smokeless tobacco: Current User    Types: Chew     Comment: stopped at age 14  . Alcohol Use: Yes     Comment: 2 drinks per day    Family History  Problem Relation Age of Onset  . Prostate cancer Father   . Hypertension Father   . Heart failure Father   . Leukemia Father   . Lung cancer Mother   . Obesity Mother   . Colon cancer Maternal Grandmother   . Diabetes Other     FH- maternal side  . Prostate cancer Other     FH-maternal side    Allergies  Allergen Reactions  . Celebrex [Celecoxib] Itching  .  Sulfonamide Derivatives     REACTION: causes nausea    Medication list has been reviewed and updated.  Current Outpatient Prescriptions on File Prior to Visit  Medication Sig Dispense Refill  . Calcium-Magnesium-Vitamin D (CALCIUM MAGNESIUM PO) Take by mouth.      [name] amoxicillin (AMOXIL) 875 MG tablet Take 1 tablet (875 mg total) by mouth 2 (two) times daily.  20 tablet  0  . [DISCONTINUED] propranolol (INDERAL) 40 MG tablet 1/2 tablet by mouth twice a day       No current facility-administered medications on file prior to visit.    Review of Systems:  As per HPI, otherwise negative.    Physical Examination: Filed Vitals:   12/12/13 1837  BP: 110/62  Pulse: 68  Temp: 98.2 Andre (36.8 C)  Resp: 18   Filed Vitals:   12/12/13 1837  Height: 5' 7.5" (1.715 m)  Weight: 159 lb (72.122 kg)   Body mass index is 24.52 kg/(m^2). Ideal Body Weight: Weight in (lb) to have BMI = 25: 161.7  GEN: WDWN, NAD, Non-toxic, A & O x 3 HEENT: Atraumatic, Normocephalic. Neck supple. No masses, No LAD. Ears and Nose: No external deformity. CV: RRR, No M/G/R. No JVD. No thrill. No extra heart sounds. PULM: CTA B, no wheezes, crackles, rhonchi.  No retractions. No resp. distress. No accessory muscle use. ABD: S, NT, ND, +BS. No rebound. No HSM. EXTR: No c/c/e NEURO Normal gait.  PSYCH: Normally interactive. Conversant. Not depressed or anxious appearing.  Calm demeanor.    Assessment and Plan: Wellness examination   Signed,  Phillips Odor, MD

## 2013-12-27 ENCOUNTER — Encounter: Payer: 59 | Admitting: Family

## 2015-01-31 ENCOUNTER — Emergency Department (HOSPITAL_COMMUNITY)
Admission: EM | Admit: 2015-01-31 | Discharge: 2015-01-31 | Disposition: A | Discharge status: Home / Self Care | Payer: No Typology Code available for payment source | Attending: Emergency Medicine | Admitting: Emergency Medicine

## 2015-01-31 ENCOUNTER — Encounter (HOSPITAL_COMMUNITY): Payer: Self-pay | Admitting: Nurse Practitioner

## 2015-01-31 DIAGNOSIS — I10 Essential (primary) hypertension: Secondary | ICD-10-CM | POA: Diagnosis not present

## 2015-01-31 DIAGNOSIS — Z8719 Personal history of other diseases of the digestive system: Secondary | ICD-10-CM | POA: Insufficient documentation

## 2015-01-31 DIAGNOSIS — Y92410 Unspecified street and highway as the place of occurrence of the external cause: Secondary | ICD-10-CM | POA: Diagnosis not present

## 2015-01-31 DIAGNOSIS — Y998 Other external cause status: Secondary | ICD-10-CM | POA: Diagnosis not present

## 2015-01-31 DIAGNOSIS — M79605 Pain in left leg: Secondary | ICD-10-CM

## 2015-01-31 DIAGNOSIS — Y9389 Activity, other specified: Secondary | ICD-10-CM | POA: Diagnosis not present

## 2015-01-31 DIAGNOSIS — Z87891 Personal history of nicotine dependence: Secondary | ICD-10-CM | POA: Diagnosis not present

## 2015-01-31 DIAGNOSIS — S8992XA Unspecified injury of left lower leg, initial encounter: Secondary | ICD-10-CM | POA: Diagnosis present

## 2015-01-31 DIAGNOSIS — M62838 Other muscle spasm: Secondary | ICD-10-CM

## 2015-01-31 DIAGNOSIS — Z792 Long term (current) use of antibiotics: Secondary | ICD-10-CM | POA: Insufficient documentation

## 2015-01-31 DIAGNOSIS — S8012XA Contusion of left lower leg, initial encounter: Secondary | ICD-10-CM | POA: Insufficient documentation

## 2015-01-31 DIAGNOSIS — J45909 Unspecified asthma, uncomplicated: Secondary | ICD-10-CM | POA: Diagnosis not present

## 2015-01-31 DIAGNOSIS — Z8639 Personal history of other endocrine, nutritional and metabolic disease: Secondary | ICD-10-CM | POA: Diagnosis not present

## 2015-01-31 MED ORDER — NAPROXEN 500 MG PO TABS: 500.0000 mg | ORAL_TABLET | Freq: Two times a day (BID) | ORAL | Status: DC | PRN

## 2015-01-31 MED ORDER — CYCLOBENZAPRINE HCL 10 MG PO TABS: 10.0000 mg | ORAL_TABLET | Freq: Three times a day (TID) | ORAL | Status: DC | PRN

## 2015-01-31 MED ORDER — NAPROXEN 250 MG PO TABS
500.0000 mg | ORAL_TABLET | Freq: Once | ORAL | Status: AC
  Administered 2015-01-31: 500 mg via ORAL
  Filled 2015-01-31: qty 2

## 2015-01-31 MED ORDER — CYCLOBENZAPRINE HCL 10 MG PO TABS
5.0000 mg | ORAL_TABLET | Freq: Once | ORAL | Status: AC
  Administered 2015-01-31: 5 mg via ORAL
  Filled 2015-01-31: qty 1

## 2015-01-31 NOTE — Discharge Instructions (Signed)
Take naprosyn as directed for inflammation and pain with tylenol for breakthrough pain and flexeril for muscle relaxation. Do not drive or operate machinery with muscle relaxation use. Use heat to areas of soreness, no more than 20 minutes at a time for each. Expect to be sore for the next few days and follow up with primary care physician for recheck of ongoing symptoms in 1-2 weeks. Return to ER for emergent changing or worsening of symptoms.     Contusion A contusion is a deep bruise. Contusions happen when an injury causes bleeding under the skin. Signs of bruising include pain, puffiness (swelling), and discolored skin. The contusion may turn blue, purple, or yellow. HOME CARE   Put ice on the injured area.  Put ice in a plastic bag.  Place a towel between your skin and the bag.  Leave the ice on for 15-20 minutes, 03-04 times a day.  Only take medicine as told by your doctor.  Rest the injured area.  If possible, raise (elevate) the injured area to lessen puffiness. GET HELP RIGHT AWAY IF:   You have more bruising or puffiness.  You have pain that is getting worse.  Your puffiness or pain is not helped by medicine. MAKE SURE YOU:   Understand these instructions.  Will watch your condition.  Will get help right away if you are not doing well or get worse. Document Released: 03/14/2008 Document Revised: 12/19/2011 Document Reviewed: 08/01/2011 [facility] Patient Information 2015 [place], [place]. This information is not intended to replace advice given to you by your health care provider. Make sure you discuss any questions you have with your health care provider.  Heat Therapy Heat therapy can help ease sore, stiff, injured, and tight muscles and joints. Heat relaxes your muscles, which may help ease your pain.  RISKS AND COMPLICATIONS If you have any of the following conditions, do not use heat therapy unless your health care provider has approved:  Poor  circulation.  Healing wounds or scarred skin in the area being treated.  Diabetes, heart disease, or high blood pressure.  Not being able to feel (numbness) the area being treated.  Unusual swelling of the area being treated.  Active infections.  Blood clots.  Cancer.  Inability to communicate pain. This may include young children and people who have problems with their brain function (dementia).  Pregnancy. Heat therapy should only be used on old, pre-existing, or long-lasting (chronic) injuries. Do not use heat therapy on new injuries unless directed by your health care provider. HOW TO USE HEAT THERAPY There are several different kinds of heat therapy, including:  Moist heat pack.  Warm water bath.  Hot water bottle.  Electric heating pad.  Heated gel pack.  Heated wrap.  Electric heating pad. Use the heat therapy method suggested by your health care provider. Follow your health care provider's instructions on when and how to use heat therapy. GENERAL HEAT THERAPY RECOMMENDATIONS  Do not sleep while using heat therapy. Only use heat therapy while you are awake.  Your skin may turn pink while using heat therapy. Do not use heat therapy if your skin turns red.  Do not use heat therapy if you have new pain.  High heat or long exposure to heat can cause burns. Be careful when using heat therapy to avoid burning your skin.  Do not use heat therapy on areas of your skin that are already irritated, such as with a rash or sunburn. SEEK MEDICAL CARE IF:  You have blisters, redness, swelling, or numbness.  You have new pain.  Your pain is worse. MAKE SURE YOU:  Understand these instructions.  Will watch your condition.  Will get help right away if you are not doing well or get worse. Document Released: 12/19/2011 Document Revised: 02/10/2014 Document Reviewed: 11/19/2013 [facility] Patient Information 2015 [place], [place]. This information is not intended to  replace advice given to you by your health care provider. Make sure you discuss any questions you have with your health care provider.  Motor Vehicle Collision It is common to have multiple bruises and sore muscles after a motor vehicle collision (MVC). These tend to feel worse for the first 24 hours. You may have the most stiffness and soreness over the first several hours. You may also feel worse when you wake up the first morning after your collision. After this point, you will usually begin to improve with each day. The speed of improvement often depends on the severity of the collision, the number of injuries, and the location and nature of these injuries. HOME CARE INSTRUCTIONS  Put ice on the injured area.  Put ice in a plastic bag.  Place a towel between your skin and the bag.  Leave the ice on for 15-20 minutes, 3-4 times a day, or as directed by your health care provider.  Drink enough fluids to keep your urine clear or pale yellow. Do not drink alcohol.  Take a warm shower or bath once or twice a day. This will increase blood flow to sore muscles.  You may return to activities as directed by your caregiver. Be careful when lifting, as this may aggravate neck or back pain.  Only take over-the-counter or prescription medicines for pain, discomfort, or fever as directed by your caregiver. Do not use aspirin. This may increase bruising and bleeding. SEEK IMMEDIATE MEDICAL CARE IF:  You have numbness, tingling, or weakness in the arms or legs.  You develop severe headaches not relieved with medicine.  You have severe neck pain, especially tenderness in the middle of the back of your neck.  You have changes in bowel or bladder control.  There is increasing pain in any area of the body.  You have shortness of breath, light-headedness, dizziness, or fainting.  You have chest pain.  You feel sick to your stomach (nauseous), throw up (vomit), or sweat.  You have increasing  abdominal discomfort.  There is blood in your urine, stool, or vomit.  You have pain in your shoulder (shoulder strap areas).  You feel your symptoms are getting worse. MAKE SURE YOU:  Understand these instructions.  Will watch your condition.  Will get help right away if you are not doing well or get worse. Document Released: 09/26/2005 Document Revised: 02/10/2014 Document Reviewed: 02/23/2011 [employer] Patient Information 2015 [place], [place]. This information is not intended to replace advice given to you by your health care provider. Make sure you discuss any questions you have with your health care provider.
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
## 2016-07-04 ENCOUNTER — Encounter (HOSPITAL_BASED_OUTPATIENT_CLINIC_OR_DEPARTMENT_OTHER): Payer: Self-pay | Admitting: *Deleted

## 2016-07-04 NOTE — Progress Notes (Signed)
Pt coming in Wednesday for MRSA swab and EKG. Told him to pack overnight bag for RCC and Bring all medications

## 2016-07-06 ENCOUNTER — Other Ambulatory Visit: Payer: Self-pay | Admitting: Orthopedic Surgery

## 2016-07-06 ENCOUNTER — Encounter (HOSPITAL_BASED_OUTPATIENT_CLINIC_OR_DEPARTMENT_OTHER)
Admission: RE | Admit: 2016-07-06 | Discharge: 2016-07-06 | Disposition: A | Discharge status: Home / Self Care | Payer: BLUE CROSS/BLUE SHIELD | Source: Ambulatory Visit | Attending: Orthopedic Surgery | Admitting: Orthopedic Surgery

## 2016-07-06 DIAGNOSIS — I10 Essential (primary) hypertension: Secondary | ICD-10-CM | POA: Insufficient documentation

## 2016-07-06 LAB — SURGICAL PCR SCREEN
MRSA, PCR: NEGATIVE
Staphylococcus aureus: NEGATIVE

## 2016-07-07 NOTE — H&P (Signed)
ORTHOPAEDIC HISTORY & PHYSICAL  Andre Turner MRN:  [id number] DOB/SEX:  [birth year]/male  CHIEF COMPLAINT:  Painful left knee  HISTORY: Patient is a 55 y.o. male presented with a history of pain in the left knee for 6 months. Onset of symptoms was gradual starting 7 months ago with gradually worsening course since that time. Prior procedures on the knee are steroid injection x 2. Patient has been treated conservatively with over-the-counter NSAIDs and activity modification. Patient currently rates pain at 8 out of 10 with activity. There is pain at night. present.  They have been previously treated with: NSAIDS: Steriods with mild improvement    PAST MEDICAL HISTORY: Patient Active Problem List   Diagnosis Date Noted  . Bronchitis 08/09/2011  . Chest pain 07/13/2011  . Hypertension 07/13/2011  . Hyperlipidemia 07/13/2011  . Skin lesions, generalized 06/05/2011  . Erectile dysfunction 06/05/2011  . NECK PAIN 10/30/2007  . BACK PAIN WITH RADICULOPATHY 08/20/2007  . ALLERGIC RHINITIS 06/25/2007  . REACTIVE AIRWAY DISEASE 06/25/2007  . HEADACHE, CHRONIC 06/25/2007  . BENIGN PROSTATIC HYPERTROPHY, HX OF 06/25/2007  . Other postprocedural status(V45.89) 06/25/2007   Past Medical History:  Diagnosis Date  . Allergic rhinitis   . Anemia   . Arthritis    rhuematoid and osteo arthritis  . Chronic headache    migraines  . GERD (gastroesophageal reflux disease)   . History of benign prostatic hypertrophy   . Hyperlipidemia   . Hypertension   . Restless leg   . Tricuspid regurgitation   . Unspecified asthma(493.90)    Past Surgical History:  Procedure Laterality Date  . INGUINAL HERNIA REPAIR    . KNEE ARTHROSCOPY    . TENDON REPAIR    . TONSILLECTOMY       MEDICATIONS:   No prescriptions prior to admission.    ALLERGIES:   Allergies  Allergen Reactions  . Celebrex [Celecoxib] Itching  . Percocet [Oxycodone-Acetaminophen]   . Sulfonamide Derivatives Hives   REACTION: causes nausea , itchy  . Latex Rash    REVIEW OF SYSTEMS:  Pertinent items are noted in HPI.   FAMILY HISTORY:   Family History  Problem Relation Age of Onset  . Prostate cancer Father   . Hypertension Father   . Heart failure Father   . Leukemia Father   . Lung cancer Mother   . Obesity Mother   . Colon cancer Maternal Grandmother   . Diabetes Other     FH- maternal side  . Prostate cancer Other     FH-maternal side    SOCIAL HISTORY:   Social History  Substance Use Topics  . Smoking status: Former Games developer  . Smokeless tobacco: Never Used     Comment: stopped at age 32  . Alcohol use Yes     Comment: 2 drinks per day      EXAMINATION: Vital signs in last 24 hours:    Head is normocephalic.   Eyes:  Pupils equal, round and reactive to light and accommodation.  Extraocular intact. ENT: Ears, nose, and throat were benign.   Neck: supple, no bruits were noted.   Chest: good expansion.   Lungs: essentially clear.   Cardiac: regular rhythm and rate, normal S1, S2.  No murmurs appreciated. Pulses :  2+ bilateral and symmetric in lower extremities. Abdomen is scaphoid, soft, nontender, no masses palpable, normal bowel sounds                  present. CNS:  He is  oriented x3 and cranial nerves II-XII grossly intact. Breast, rectal, and genital exams: not performed and not indicated for an orthopedic evaluation. Musculoskeletal:   Ht 5\' 7"  (1.702 m)   Wt 72.6 kg (160 lb)   BMI 25.06 kg/m   General Appearance:    Alert, cooperative, no distress, appears stated age  Head:    Normocephalic, without obvious abnormality, atraumatic  Eyes:    PERRL, conjunctiva/corneas clear, EOM's intact, fundi    benign, both eyes       Ears:    Normal TM's and external ear canals, both ears  Nose:   Nares normal, septum midline, mucosa normal, no drainage    or sinus tenderness  Throat:   Lips, mucosa, and tongue normal; teeth and gums normal  Neck:   Supple, symmetrical,  trachea midline, no adenopathy;       thyroid:  No enlargement/tenderness/nodules; no carotid   bruit or JVD  Back:     Symmetric, no curvature, ROM normal, no CVA tenderness  Lungs:     Clear to auscultation bilaterally, respirations unlabored  Chest wall:    No tenderness or deformity  Heart:    Regular rate and rhythm, S1 and S2 normal, no murmur, rub   or gallop  Abdomen:     Soft, non-tender, bowel sounds active all four quadrants,    no masses, no organomegaly  Genitalia:    Normal male without lesion, discharge or tenderness  Rectal:    Normal tone, normal prostate, no masses or tenderness;   guaiac negative stool  Extremities:   Extremities normal, atraumatic, no cyanosis or edema  Pulses:   2+ and symmetric all extremities  Skin:   Skin color, texture, turgor normal, no rashes or lesions  Lymph nodes:   Cervical, supraclavicular, and axillary nodes normal  Neurologic:   CNII-XII intact. Normal strength, sensation and reflexes      throughout     Musculoskeletal:  ROM 0-100-Examination of the left knee reveals diffuse tenderness to palpation anteriorly.  He does have patellar grind on extension of the knee.  All of his ligaments are intact.  No effusion present in the knee.  He is neurovascularly intact distally.  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 Allergic rhinitis   . Anemia   . Arthritis    rhuematoid and osteo arthritis  . Chronic headache    migraines  . GERD (gastroesophageal reflux disease)   . History of benign prostatic hypertrophy   . Hyperlipidemia   . Hypertension   . Restless leg   . Tricuspid regurgitation   . Unspecified asthma(493.90)     PLAN: Plan for left patellofemoral  partial joint replacement  The procedure,  risks, and benefits of total knee arthroplasty were presented and reviewed. The risks including but not limited to infection, blood clots, vascular and nerve injury, stiffness,  among others were discussed. The patient acknowledged the explanation, agreed to proceed.   Ketrick Matney Turner 07/07/2016, 7:07 PM

## 2016-07-13 NOTE — Anesthesia Preprocedure Evaluation (Addendum)
Anesthesia Evaluation  Patient identified by MRN, date of birth, ID band Patient awake    Reviewed: Allergy & Precautions, NPO status , Patient's Chart, lab work & pertinent test results  Airway Mallampati: II  TM Distance: >3 FB Neck ROM: Full    Dental  (+) Dental Advisory Given   Pulmonary former smoker,    breath sounds clear to auscultation       Cardiovascular hypertension,  Rhythm:Regular Rate:Normal     Neuro/Psych negative neurological ROS     GI/Hepatic Neg liver ROS, GERD  ,  Endo/Other  negative endocrine ROS  Renal/GU negative Renal ROS     Musculoskeletal   Abdominal   Peds  Hematology negative hematology ROS (+)   Anesthesia Other Findings   Reproductive/Obstetrics                            Anesthesia Physical Anesthesia Plan  ASA: II  Anesthesia Plan: General and Regional   Post-op Pain Management:  Regional for Post-op pain   Induction: Intravenous  Airway Management Planned: LMA  Additional Equipment:   Intra-op Plan:   Post-operative Plan: Extubation in OR  Informed Consent: I have reviewed the patients History and Physical, chart, labs and discussed the procedure including the risks, benefits and alternatives for the proposed anesthesia with the patient or authorized representative who has indicated his/her understanding and acceptance.   Dental advisory given  Plan Discussed with: CRNA  Anesthesia Plan Comments:        Anesthesia Quick Evaluation

## 2016-07-14 ENCOUNTER — Ambulatory Visit (HOSPITAL_BASED_OUTPATIENT_CLINIC_OR_DEPARTMENT_OTHER)
Admission: RE | Admit: 2016-07-14 | Discharge: 2016-07-14 | Disposition: A | Discharge status: Home / Self Care | Payer: BLUE CROSS/BLUE SHIELD | Source: Ambulatory Visit | Attending: Orthopedic Surgery | Admitting: Orthopedic Surgery

## 2016-07-14 ENCOUNTER — Encounter (HOSPITAL_BASED_OUTPATIENT_CLINIC_OR_DEPARTMENT_OTHER)
Admission: RE | Disposition: A | Discharge status: Home / Self Care | Payer: Self-pay | Source: Ambulatory Visit | Attending: Orthopedic Surgery

## 2016-07-14 ENCOUNTER — Encounter (HOSPITAL_BASED_OUTPATIENT_CLINIC_OR_DEPARTMENT_OTHER): Payer: Self-pay | Admitting: *Deleted

## 2016-07-14 ENCOUNTER — Ambulatory Visit (HOSPITAL_BASED_OUTPATIENT_CLINIC_OR_DEPARTMENT_OTHER): Payer: BLUE CROSS/BLUE SHIELD | Admitting: Anesthesiology

## 2016-07-14 DIAGNOSIS — M1712 Unilateral primary osteoarthritis, left knee: Secondary | ICD-10-CM | POA: Diagnosis not present

## 2016-07-14 DIAGNOSIS — K219 Gastro-esophageal reflux disease without esophagitis: Secondary | ICD-10-CM | POA: Diagnosis not present

## 2016-07-14 DIAGNOSIS — E785 Hyperlipidemia, unspecified: Secondary | ICD-10-CM | POA: Diagnosis not present

## 2016-07-14 DIAGNOSIS — N529 Male erectile dysfunction, unspecified: Secondary | ICD-10-CM | POA: Diagnosis not present

## 2016-07-14 DIAGNOSIS — Z87891 Personal history of nicotine dependence: Secondary | ICD-10-CM | POA: Diagnosis not present

## 2016-07-14 DIAGNOSIS — N400 Enlarged prostate without lower urinary tract symptoms: Secondary | ICD-10-CM | POA: Insufficient documentation

## 2016-07-14 DIAGNOSIS — I10 Essential (primary) hypertension: Secondary | ICD-10-CM | POA: Insufficient documentation

## 2016-07-14 DIAGNOSIS — G2581 Restless legs syndrome: Secondary | ICD-10-CM | POA: Insufficient documentation

## 2016-07-14 HISTORY — DX: Unspecified osteoarthritis, unspecified site: M19.90

## 2016-07-14 HISTORY — DX: Restless legs syndrome: G25.81

## 2016-07-14 HISTORY — DX: Anemia, unspecified: D64.9

## 2016-07-14 HISTORY — PX: PATELLA-FEMORAL ARTHROPLASTY: SHX5037

## 2016-07-14 HISTORY — DX: Rheumatic tricuspid insufficiency: I07.1

## 2016-07-14 SURGERY — ARTHROPLASTY, PATELLOFEMORAL
Anesthesia: Regional | Site: Knee | Laterality: Left

## 2016-07-14 MED ORDER — FENTANYL CITRATE (PF) 100 MCG/2ML IJ SOLN
50.0000 ug | INTRAMUSCULAR | Status: AC | PRN
  Administered 2016-07-14 (×4): 25 ug via INTRAVENOUS
  Administered 2016-07-14: 50 ug via INTRAVENOUS

## 2016-07-14 MED ORDER — CEFAZOLIN SODIUM-DEXTROSE 2-4 GM/100ML-% IV SOLN
INTRAVENOUS | Status: AC
  Filled 2016-07-14: qty 100

## 2016-07-14 MED ORDER — METOCLOPRAMIDE HCL 5 MG/ML IJ SOLN: 5.0000 mg | Freq: Three times a day (TID) | INTRAMUSCULAR | Status: DC | PRN

## 2016-07-14 MED ORDER — ONDANSETRON HCL 4 MG PO TABS: 4.0000 mg | ORAL_TABLET | Freq: Four times a day (QID) | ORAL | Status: DC | PRN

## 2016-07-14 MED ORDER — MIDAZOLAM HCL 2 MG/2ML IJ SOLN
1.0000 mg | INTRAMUSCULAR | Status: DC | PRN
  Administered 2016-07-14: 2 mg via INTRAVENOUS

## 2016-07-14 MED ORDER — HYDROMORPHONE HCL 1 MG/ML IJ SOLN: 0.2500 mg | INTRAMUSCULAR | Status: DC | PRN

## 2016-07-14 MED ORDER — PROPOFOL 500 MG/50ML IV EMUL
INTRAVENOUS | Status: AC
  Filled 2016-07-14: qty 50

## 2016-07-14 MED ORDER — PROPOFOL 10 MG/ML IV BOLUS
INTRAVENOUS | Status: DC | PRN
  Administered 2016-07-14: 200 mg via INTRAVENOUS

## 2016-07-14 MED ORDER — ONDANSETRON HCL 4 MG/2ML IJ SOLN: 4.0000 mg | Freq: Four times a day (QID) | INTRAMUSCULAR | Status: DC | PRN

## 2016-07-14 MED ORDER — SODIUM CHLORIDE 0.9 % IR SOLN
Status: DC | PRN
  Administered 2016-07-14: 1000 mL

## 2016-07-14 MED ORDER — HYDROCODONE-ACETAMINOPHEN 7.5-325 MG PO TABS: 1.0000 | ORAL_TABLET | Freq: Once | ORAL | Status: DC | PRN

## 2016-07-14 MED ORDER — BUPIVACAINE LIPOSOME 1.3 % IJ SUSP
INTRAMUSCULAR | Status: DC | PRN
  Administered 2016-07-14: 20 mL

## 2016-07-14 MED ORDER — METHOCARBAMOL 500 MG PO TABS: 500.0000 mg | ORAL_TABLET | Freq: Four times a day (QID) | ORAL | 1 refills | Status: DC | PRN

## 2016-07-14 MED ORDER — MIDAZOLAM HCL 2 MG/2ML IJ SOLN
INTRAMUSCULAR | Status: AC
  Filled 2016-07-14: qty 2

## 2016-07-14 MED ORDER — PROMETHAZINE HCL 25 MG/ML IJ SOLN: 6.2500 mg | INTRAMUSCULAR | Status: DC | PRN

## 2016-07-14 MED ORDER — ONDANSETRON HCL 4 MG/2ML IJ SOLN
INTRAMUSCULAR | Status: DC | PRN
  Administered 2016-07-14: 4 mg via INTRAVENOUS

## 2016-07-14 MED ORDER — GLYCOPYRROLATE 0.2 MG/ML IJ SOLN: 0.2000 mg | Freq: Once | INTRAMUSCULAR | Status: DC | PRN

## 2016-07-14 MED ORDER — LIDOCAINE 2% (20 MG/ML) 5 ML SYRINGE
INTRAMUSCULAR | Status: AC
  Filled 2016-07-14: qty 5

## 2016-07-14 MED ORDER — SCOPOLAMINE 1 MG/3DAYS TD PT72: 1.0000 | MEDICATED_PATCH | Freq: Once | TRANSDERMAL | Status: DC | PRN

## 2016-07-14 MED ORDER — OXYCODONE-ACETAMINOPHEN 5-325 MG PO TABS: 1.0000 | ORAL_TABLET | ORAL | 0 refills | Status: DC | PRN

## 2016-07-14 MED ORDER — FENTANYL CITRATE (PF) 100 MCG/2ML IJ SOLN
INTRAMUSCULAR | Status: AC
  Filled 2016-07-14: qty 2

## 2016-07-14 MED ORDER — LACTATED RINGERS IV SOLN
INTRAVENOUS | Status: DC
  Administered 2016-07-14: 09:00:00 via INTRAVENOUS
  Administered 2016-07-14: 10 mL/h via INTRAVENOUS

## 2016-07-14 MED ORDER — BUPIVACAINE-EPINEPHRINE (PF) 0.5% -1:200000 IJ SOLN
INTRAMUSCULAR | Status: DC | PRN
  Administered 2016-07-14: 25 mL via PERINEURAL

## 2016-07-14 MED ORDER — BUPIVACAINE LIPOSOME 1.3 % IJ SUSP
INTRAMUSCULAR | Status: AC
  Filled 2016-07-14: qty 20

## 2016-07-14 MED ORDER — HYDROCODONE-ACETAMINOPHEN 7.5-325 MG PO TABS: 1.0000 | ORAL_TABLET | Freq: Four times a day (QID) | ORAL | 0 refills | Status: DC | PRN

## 2016-07-14 MED ORDER — HYDROMORPHONE HCL 1 MG/ML IJ SOLN: 0.5000 mg | INTRAMUSCULAR | Status: DC | PRN

## 2016-07-14 MED ORDER — DEXAMETHASONE SODIUM PHOSPHATE 4 MG/ML IJ SOLN
INTRAMUSCULAR | Status: DC | PRN
  Administered 2016-07-14: 10 mg via INTRAVENOUS

## 2016-07-14 MED ORDER — ONDANSETRON HCL 4 MG/2ML IJ SOLN
INTRAMUSCULAR | Status: AC
  Filled 2016-07-14: qty 2

## 2016-07-14 MED ORDER — METOCLOPRAMIDE HCL 5 MG PO TABS
5.0000 mg | ORAL_TABLET | Freq: Three times a day (TID) | ORAL | Status: DC | PRN
Start: 2016-07-14 — End: 2016-07-14

## 2016-07-14 MED ORDER — CEFAZOLIN SODIUM-DEXTROSE 2-4 GM/100ML-% IV SOLN
2.0000 g | INTRAVENOUS | Status: AC
  Administered 2016-07-14: 2 g via INTRAVENOUS

## 2016-07-14 MED ORDER — DEXAMETHASONE SODIUM PHOSPHATE 10 MG/ML IJ SOLN
INTRAMUSCULAR | Status: AC
  Filled 2016-07-14: qty 1

## 2016-07-14 MED ORDER — LIDOCAINE 2% (20 MG/ML) 5 ML SYRINGE
INTRAMUSCULAR | Status: DC | PRN
  Administered 2016-07-14: 20 mg via INTRAVENOUS

## 2016-07-14 SURGICAL SUPPLY — 76 items
BAG DECANTER FOR FLEXI CONT (MISCELLANEOUS) ×2 IMPLANT
BANDAGE ACE 4X5 VEL STRL LF (GAUZE/BANDAGES/DRESSINGS) ×2 IMPLANT
BANDAGE ACE 6X5 VEL STRL LF (GAUZE/BANDAGES/DRESSINGS) ×2 IMPLANT
BANDAGE ESMARK 6X9 LF (GAUZE/BANDAGES/DRESSINGS) ×1 IMPLANT
BENZOIN TINCTURE PRP APPL 2/3 (GAUZE/BANDAGES/DRESSINGS) ×2 IMPLANT
BLADE SAG 18X100X1.27 (BLADE) ×2 IMPLANT
BLADE SAW SGTL 13.0X1.19X90.0M (BLADE) IMPLANT
BLADE SURG 15 STRL LF DISP TIS (BLADE) ×2 IMPLANT
BLADE SURG 15 STRL SS (BLADE) ×2
BNDG ESMARK 6X9 LF (GAUZE/BANDAGES/DRESSINGS) ×2
BOWL SMART MIX CTS (DISPOSABLE) ×2 IMPLANT
BUR SURG 4X8 MED (BURR) IMPLANT
BURR SURG 4X8 MED (BURR)
CEMENT BONE SIMPLEX SPEEDSET (Cement) ×2 IMPLANT
COVER BACK TABLE 60X90IN (DRAPES) ×2 IMPLANT
CUFF TOURNIQUET SINGLE 34IN LL (TOURNIQUET CUFF) ×2 IMPLANT
DECANTER SPIKE VIAL GLASS SM (MISCELLANEOUS) ×2 IMPLANT
DRAPE EXTREMITY T 121X128X90 (DRAPE) ×2 IMPLANT
DRAPE INCISE IOBAN 66X45 STRL (DRAPES) ×2 IMPLANT
DRAPE U-SHAPE 47X51 STRL (DRAPES) ×2 IMPLANT
DURAPREP 26ML APPLICATOR (WOUND CARE) ×2 IMPLANT
ELECT REM PT RETURN 9FT ADLT (ELECTROSURGICAL) ×2
ELECTRODE REM PT RTRN 9FT ADLT (ELECTROSURGICAL) ×1 IMPLANT
FEM-TROCHLEA KNEE XLG 7.0X-4.0 (Orthopedic Implant) ×2 IMPLANT
GAUZE SPONGE 4X4 12PLY STRL (GAUZE/BANDAGES/DRESSINGS) ×2 IMPLANT
GAUZE XEROFORM 5X9 LF (GAUZE/BANDAGES/DRESSINGS) ×2 IMPLANT
GLOVE BIOGEL PI IND STRL 7.0 (GLOVE) ×3 IMPLANT
GLOVE BIOGEL PI INDICATOR 7.0 (GLOVE) ×3
GLOVE ECLIPSE 7.0 STRL STRAW (GLOVE) IMPLANT
GLOVE SURG ORTHO 8.0 STRL STRW (GLOVE) IMPLANT
GLOVE SURG SS PI 7.0 STRL IVOR (GLOVE) ×2 IMPLANT
GLOVE SURG SS PI 8.0 STRL IVOR (GLOVE) ×6 IMPLANT
GOWN STRL REUS W/ TWL LRG LVL3 (GOWN DISPOSABLE) ×1 IMPLANT
GOWN STRL REUS W/ TWL XL LVL3 (GOWN DISPOSABLE) ×2 IMPLANT
GOWN STRL REUS W/TWL LRG LVL3 (GOWN DISPOSABLE) ×1
GOWN STRL REUS W/TWL XL LVL3 (GOWN DISPOSABLE) ×2
HANDPIECE INTERPULSE COAX TIP (DISPOSABLE) ×1
IMMOBILIZER KNEE 22 UNIV (SOFTGOODS) IMPLANT
IMMOBILIZER KNEE 24 THIGH 36 (MISCELLANEOUS) ×1 IMPLANT
IMMOBILIZER KNEE 24 UNIV (MISCELLANEOUS) ×2
KNEE WRAP E Z 3 GEL PACK (MISCELLANEOUS) ×2 IMPLANT
NDL SAFETY ECLIPSE 18X1.5 (NEEDLE) ×2 IMPLANT
NEEDLE HYPO 18GX1.5 SHARP (NEEDLE) ×4
NS IRRIG 1000ML POUR BTL (IV SOLUTION) ×2 IMPLANT
PACK ARTHROSCOPY DSU (CUSTOM PROCEDURE TRAY) ×2 IMPLANT
PACK BASIN DAY SURGERY FS (CUSTOM PROCEDURE TRAY) ×2 IMPLANT
PAD CAST 4YDX4 CTTN HI CHSV (CAST SUPPLIES) IMPLANT
PADDING CAST COTTON 4X4 STRL (CAST SUPPLIES)
PADDING CAST COTTON 6X4 STRL (CAST SUPPLIES) IMPLANT
PATELLA 32MMX10MM (Knees) ×2 IMPLANT
PENCIL BUTTON HOLSTER BLD 10FT (ELECTRODE) ×2 IMPLANT
POST TAPER 11MM (Orthopedic Implant) ×2 IMPLANT
SET HNDPC FAN SPRY TIP SCT (DISPOSABLE) ×1 IMPLANT
SHEET MEDIUM DRAPE 40X70 STRL (DRAPES) ×2 IMPLANT
SLEEVE SCD COMPRESS KNEE MED (MISCELLANEOUS) ×2 IMPLANT
SPONGE LAP 18X18 X RAY DECT (DISPOSABLE) ×2 IMPLANT
STAPLER VISISTAT 35W (STAPLE) IMPLANT
STRIP CLOSURE SKIN 1/2X4 (GAUZE/BANDAGES/DRESSINGS) ×2 IMPLANT
SUCTION FRAZIER HANDLE 10FR (MISCELLANEOUS) ×1
SUCTION TUBE FRAZIER 10FR DISP (MISCELLANEOUS) ×1 IMPLANT
SUT FIBERWIRE #2 38 T-5 BLUE (SUTURE)
SUT MNCRL AB 4-0 PS2 18 (SUTURE) ×2 IMPLANT
SUT VIC AB 0 CT1 27 (SUTURE) ×2
SUT VIC AB 0 CT1 27XBRD ANBCTR (SUTURE) ×1 IMPLANT
SUT VIC AB 0 SH 27 (SUTURE) IMPLANT
SUT VIC AB 1 CT1 27 (SUTURE)
SUT VIC AB 1 CT1 27XBRD ANBCTR (SUTURE) IMPLANT
SUT VIC AB 2-0 SH 27 (SUTURE) ×1
SUT VIC AB 2-0 SH 27XBRD (SUTURE) ×1 IMPLANT
SUTURE FIBERWR #2 38 T-5 BLUE (SUTURE) IMPLANT
SYR 50ML LL SCALE MARK (SYRINGE) ×2 IMPLANT
SYR BULB 3OZ (MISCELLANEOUS) ×2 IMPLANT
TOWEL OR 17X24 6PK STRL BLUE (TOWEL DISPOSABLE) ×2 IMPLANT
TOWEL OR NON WOVEN STRL DISP B (DISPOSABLE) ×4 IMPLANT
TUBE CONNECTING 20X1/4 (TUBING) ×2 IMPLANT
YANKAUER SUCT BULB TIP NO VENT (SUCTIONS) ×2 IMPLANT

## 2016-07-14 NOTE — Discharge Instructions (Signed)
Total Knee Replacement, Care After Refer to this sheet in the next few weeks. These instructions provide you with information on caring for yourself after your procedure. Your health care provider also may give you specific instructions. Your treatment has been planned according to the most current medical practices, but problems sometimes occur. Call your health care provider if you have any problems or questions after your procedure. HOME CARE INSTRUCTIONS   See a physical therapist as directed by your health care provider.  Do not take baths, swim, or use a hot tub until your health care provider approves.  Take medicines only as directed by your health care provider.  Avoid lifting or driving until you are instructed otherwise.  If you have been sent home with a continuous passive motion machine, use it as directed by your health care provider.  Rest often, but move around as much as you can tolerate. Movement helps you to heal and helps to prevent stiffness, skin sores, and blood clots.  Wear compression stockings as told by your health care provider. These stockings help to prevent blood clots and reduce swelling in your legs.  Follow instructions from your health care provider about how to take care of your incision. Make sure you:  Wash your hands with soap and water before you change your bandage (dressing). If soap and water are not available, use hand sanitizer.  Change your dressing as told by your health care provider.  Leave stitches (sutures), skin glue, or adhesive strips in place. These skin closures may need to be in place for 2 weeks or longer. If adhesive strip edges start to loosen and curl up, you may trim the loose edges. Do not remove adhesive strips completely unless your health care provider tells you to do that. SEEK MEDICAL CARE IF:  You have difficulty breathing.  You have drainage, redness, swelling, or pain at your incision site.  You have a bad smell  coming from your incision site.  You have persistent bleeding from your incision site.  Your incision breaks open after sutures (stitches) or staples have been removed.  You have a fever. SEEK IMMEDIATE MEDICAL CARE IF:   You have a rash.  You have pain or swelling in your calf or thigh.  You have shortness of breath or chest pain.  Your range of motion in your knee is decreasing rather than increasing.   This information is not intended to replace advice given to you by your health care provider. Make sure you discuss any questions you have with your health care provider.   Document Released: 04/15/2005 Document Revised: 06/17/2015 Document Reviewed: 11/15/2011 Elsevier Interactive Patient Education 2016 [employer].   Post Anesthesia Home Care Instructions  Activity: Get plenty of rest for the remainder of the day. A responsible adult should stay with you for 24 hours following the procedure.  For the next 24 hours, DO NOT: -Drive a car -Advertising copywriter -Drink alcoholic beverages -Take any medication unless instructed by your physician -Make any legal decisions or sign important papers.  Meals: Start with liquid foods such as gelatin or soup. Progress to regular foods as tolerated. Avoid greasy, spicy, heavy foods. If nausea and/or vomiting occur, drink only clear liquids until the nausea and/or vomiting subsides. Call your physician if vomiting continues.  Special Instructions/Symptoms: Your throat may feel dry or sore from the anesthesia or the breathing tube placed in your throat during surgery. If this causes discomfort, gargle with warm salt water. The discomfort  should disappear within 24 hours.  If you had a scopolamine patch placed behind your ear for the management of post- operative nausea and/or vomiting:  1. The medication in the patch is effective for 72 hours, after which it should be removed.  Wrap patch in a tissue and discard in the trash. Wash hands  thoroughly with soap and water. 2. You may remove the patch earlier than 72 hours if you experience unpleasant side effects which may include dry mouth, dizziness or visual disturbances. 3. Avoid touching the patch. Wash your hands with soap and water after contact with the patch.   Regional Anesthesia Blocks  1. Numbness or the inability to move the "blocked" extremity may last from 3-48 hours after placement. The length of time depends on the medication injected and your individual response to the medication. If the numbness is not going away after 48 hours, call your surgeon.  2. The extremity that is blocked will need to be protected until the numbness is gone and the  Strength has returned. Because you cannot feel it, you will need to take extra care to avoid injury. Because it may be weak, you may have difficulty moving it or using it. You may not know what position it is in without looking at it while the block is in effect.  3. For blocks in the legs and feet, returning to weight bearing and walking needs to be done carefully. You will need to wait until the numbness is entirely gone and the strength has returned. You should be able to move your leg and foot normally before you try and bear weight or walk. You will need someone to be with you when you first try to ensure you do not fall and possibly risk injury.  4. Bruising and tenderness at the needle site are common side effects and will resolve in a few days.  5. Persistent numbness or new problems with movement should be communicated to the surgeon or the [facility] [phone] [facility] [phone]). Information for Discharge Teaching: EXPAREL (bupivacaine liposome injectable suspension)   Your surgeon gave you EXPAREL(bupivacaine) in your surgical incision to help control your pain after surgery.   EXPAREL is a local anesthetic that provides pain relief by numbing the tissue around the surgical  site.  EXPAREL is designed to release pain medication over time and can control pain for up to 72 hours.  Depending on how you respond to EXPAREL, you may require less pain medication during your recovery.  Possible side effects:  Temporary loss of sensation or ability to move in the area where bupivacaine was injected.  Nausea, vomiting, constipation  Rarely, numbness and tingling in your mouth or lips, lightheadedness, or anxiety may occur.  Call your doctor right away if you think you may be experiencing any of these sensations, or if you have other questions regarding possible side effects.  Follow all other discharge instructions given to you by your surgeon or nurse. Eat a healthy diet and drink plenty of water or other fluids.  If you return to the hospital for any reason within 96 hours following the administration of EXPAREL, please inform your health care providers.

## 2016-07-14 NOTE — Anesthesia Procedure Notes (Signed)
Procedure Name: LMA Insertion Date/Time: 07/14/2016 8:37 AM Performed by: Caren Macadam Pre-anesthesia Checklist: Patient identified, Emergency Drugs available, Suction available and Patient being monitored Patient Re-evaluated:Patient Re-evaluated prior to inductionOxygen Delivery Method: Circle system utilized Preoxygenation: Pre-oxygenation with 100% oxygen Intubation Type: IV induction Ventilation: Mask ventilation without difficulty LMA: LMA inserted LMA Size: 5.0 Number of attempts: 1 Airway Equipment and Method: Bite block Placement Confirmation: positive ETCO2 and breath sounds checked- equal and bilateral Tube secured with: Tape Dental Injury: Teeth and Oropharynx as per pre-operative assessment

## 2016-07-14 NOTE — Anesthesia Procedure Notes (Signed)
Anesthesia Regional Block:  Femoral nerve block  Pre-Anesthetic Checklist: ,, timeout performed, Correct Patient, Correct Site, Correct Laterality, Correct Procedure, Correct Position, site marked, Risks and benefits discussed,  Surgical consent,  Pre-op evaluation,  At surgeon's request and post-op pain management  Laterality: Left  Prep: chloraprep       Needles:  Injection technique: Single-shot  Needle Type: Echogenic Stimulator Needle     Needle Length: 9cm 9 cm Needle Gauge: 21 and 21 G    Additional Needles:  Procedures: ultrasound guided (picture in chart) and nerve stimulator Femoral nerve block  Nerve Stimulator or Paresthesia:  Response: quadraceps contraction, 0.45 mA,   Additional Responses:   Narrative:  Start time: 07/14/2016 8:15 AM End time: 07/14/2016 8:23 AM Injection made incrementally with aspirations every 5 mL.  Performed by: Personally  Anesthesiologist: Marcene Duos  Additional Notes:

## 2016-07-14 NOTE — Interval H&P Note (Signed)
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 After consideration of risks, benefits and other options for treatment, the patient has consented to  Procedure(s): LEFT KNEE PATELLA-FEMORAL  REPLACEMENT ARTHROPLASTY (Left) as a surgical intervention .  The patient's history has been reviewed, patient examined, no change in status, stable for surgery.  I have reviewed the patient's chart and labs.  Questions were answered to the patient's satisfaction.     Loreta Ave

## 2016-07-14 NOTE — Anesthesia Postprocedure Evaluation (Signed)
Anesthesia Post Note  Patient: Dontel Harshberger  Procedure(s) Performed: Procedure(s) (LRB): LEFT KNEE PATELLA-FEMORAL  REPLACEMENT ARTHROPLASTY (Left)  Patient location during evaluation: PACU Anesthesia Type: General and Regional Level of consciousness: awake and alert Pain management: pain level controlled Vital Signs Assessment: post-procedure vital signs reviewed and stable Respiratory status: spontaneous breathing, nonlabored ventilation, respiratory function stable and patient connected to nasal cannula oxygen Cardiovascular status: blood pressure returned to baseline and stable Postop Assessment: no signs of nausea or vomiting Anesthetic complications: no    Last Vitals:  Vitals:   07/14/16 1130 07/14/16 1200  BP: 133/86 110/78  Pulse: 63 61  Resp: 10 18  Temp:  36.5 C    Last Pain:  Vitals:   07/14/16 1130  TempSrc:   PainSc: 0-No pain                 Kennieth Rad

## 2016-07-14 NOTE — Progress Notes (Signed)
Assisted Dr. Rob Fitzgerald with left, ultrasound guided, femoral block. Side rails up, monitors on throughout procedure. See vital signs in flow sheet. Tolerated Procedure well. 

## 2016-07-14 NOTE — Transfer of Care (Signed)
Immediate Anesthesia Transfer of Care Note  Patient: Andre Turner  Procedure(s) Performed: Procedure(s): LEFT KNEE PATELLA-FEMORAL  REPLACEMENT ARTHROPLASTY (Left)  Patient Location: PACU  Anesthesia Type:General  Level of Consciousness: awake  Airway & Oxygen Therapy: Patient Spontanous Breathing and Patient connected to face mask oxygen  Post-op Assessment: Report given to RN and Post -op Vital signs reviewed and stable  Post vital signs: Reviewed and stable  Last Vitals:  Vitals:   07/14/16 0820 07/14/16 0825  BP:  106/71  Pulse: (!) 59 (!) 55  Resp: (!) 22 14  Temp:      Last Pain:  Vitals:   07/14/16 0738  TempSrc: Oral  PainSc: 2          Complications: No apparent anesthesia complications

## 2016-07-15 ENCOUNTER — Encounter (HOSPITAL_BASED_OUTPATIENT_CLINIC_OR_DEPARTMENT_OTHER): Payer: Self-pay | Admitting: Orthopedic Surgery

## 2016-07-15 NOTE — Op Note (Signed)
Andre Turner, Andre Turner             ACCOUNT NO.:  [id number]  MEDICAL RECORD NO.:  [id number]  LOCATION:                                 FACILITY:  PHYSICIAN:  Loreta Ave, M.D. DATE OF BIRTH:  Oct 09, 1961  DATE OF PROCEDURE:  07/14/2016 DATE OF DISCHARGE:                              OPERATIVE REPORT   PREOPERATIVE DIAGNOSIS:  Left knee end-stage arthritis, primary localized patellofemoral joint.  POSTOPERATIVE DIAGNOSIS:  Left knee end-stage arthritis, primary localized patellofemoral joint.  PROCEDURE:  Unicompartmental replacement, left knee patellofemoral joint utilizing [employer] Arthrosurface prosthesis.  A press-fit 7 x 4 trochlear component with an 11 mm post.  A cemented pegged medial offset 32 mm patellar component.  SURGEON:  Loreta Ave, M.D.  ASSISTANT:  Laural Benes. Jannet Mantis., present throughout the entire case and necessary for timely completion of procedure.  ANESTHESIA:  General.  BLOOD LOSS:  Minimal.  SPECIMENS:  None.  CULTURES:  None.  COMPLICATIONS:  None.  DRESSINGS:  Soft compressive, knee immobilizer.  TOURNIQUET TIME:  50 minutes.  DESCRIPTION OF PROCEDURE:  The patient was brought to the operating room and after adequate anesthesia had been obtained, tourniquet applied, prepped and draped in the usual sterile fashion.  Exsanguinated with elevation of Esmarch, tourniquet inflated to 350 mmHg.  Knee examined. Full motion.  Previous lateral release.  Tracking not under-reasonable. Longitudinal incision above the patella down to the tibial tubercle. Medial arthrotomy, vastus splitting.  Knee exposed.  Grade 4 changes. Marked erosion in the entire patella.  Significant erosion in lateral trochlea, which was relatively flat.  Medial and lateral compartments, medial and lateral meniscus cruciate ligaments intact.  Utilizing Arthrosurface instrumentation, the trochlea was measured, reamed, and prepared and then fitted with a press-fit 7 x  4 trochlear component with an 11 mm post.  This gave nice resurfacing, good contouring, and interface throughout.  Patella exposed, posterior 8 to 9 mm removed. Drilled, sized, and fitted for a 32 mm patella.  Once that was cemented, the knee examined.  Very pleased with tracking, motion, and stability. The wound irrigated.  Soft tissue was then injected with Exparel.  Arthrotomy closed with #1 Vicryl, the subcutaneous with subcuticular closure. Sterile compressive dressing applied.  Tourniquet deflated, removed. Anesthesia reversed.  Brought to the recovery room.  Tolerated the surgery well.  No complications.     Loreta Ave, M.D.   ______________________________ Loreta Ave, M.D.    DFM/MEDQ  D:  07/14/2016  T:  07/15/2016  Job:  [id number]

## 2016-07-19 ENCOUNTER — Encounter (HOSPITAL_BASED_OUTPATIENT_CLINIC_OR_DEPARTMENT_OTHER): Payer: Self-pay | Admitting: Orthopedic Surgery

## 2016-10-04 ENCOUNTER — Encounter (HOSPITAL_COMMUNITY): Payer: Self-pay | Admitting: Family Medicine

## 2016-10-04 ENCOUNTER — Ambulatory Visit (INDEPENDENT_AMBULATORY_CARE_PROVIDER_SITE_OTHER): Payer: BLUE CROSS/BLUE SHIELD

## 2016-10-04 ENCOUNTER — Ambulatory Visit (HOSPITAL_COMMUNITY)
Admission: EM | Admit: 2016-10-04 | Discharge: 2016-10-04 | Disposition: A | Discharge status: Home / Self Care | Payer: BLUE CROSS/BLUE SHIELD | Attending: Family Medicine | Admitting: Family Medicine

## 2016-10-04 DIAGNOSIS — R059 Cough, unspecified: Secondary | ICD-10-CM

## 2016-10-04 DIAGNOSIS — J4541 Moderate persistent asthma with (acute) exacerbation: Secondary | ICD-10-CM | POA: Diagnosis not present

## 2016-10-04 DIAGNOSIS — R05 Cough: Secondary | ICD-10-CM | POA: Diagnosis not present

## 2016-10-04 MED ORDER — IPRATROPIUM BROMIDE 0.02 % IN SOLN: 0.5000 mg | Freq: Once | RESPIRATORY_TRACT | Status: DC

## 2016-10-04 MED ORDER — IPRATROPIUM-ALBUTEROL 0.5-2.5 (3) MG/3ML IN SOLN
RESPIRATORY_TRACT | Status: AC
  Filled 2016-10-04: qty 3

## 2016-10-04 MED ORDER — PREDNISONE 20 MG PO TABS: 50.0000 mg | ORAL_TABLET | Freq: Once | ORAL | Status: DC

## 2016-10-04 MED ORDER — METHYLPREDNISOLONE SODIUM SUCC 125 MG IJ SOLR
INTRAMUSCULAR | Status: AC
  Filled 2016-10-04: qty 2

## 2016-10-04 MED ORDER — IPRATROPIUM-ALBUTEROL 0.5-2.5 (3) MG/3ML IN SOLN
3.0000 mL | Freq: Once | RESPIRATORY_TRACT | Status: AC
  Administered 2016-10-04: 3 mL via RESPIRATORY_TRACT

## 2016-10-04 MED ORDER — ALBUTEROL SULFATE (2.5 MG/3ML) 0.083% IN NEBU: 2.5000 mg | INHALATION_SOLUTION | Freq: Once | RESPIRATORY_TRACT | Status: DC

## 2016-10-04 MED ORDER — PREDNISONE 20 MG PO TABS: 50.0000 mg | ORAL_TABLET | Freq: Every day | ORAL | Status: DC

## 2016-10-04 MED ORDER — METHYLPREDNISOLONE SODIUM SUCC 125 MG IJ SOLR
125.0000 mg | Freq: Once | INTRAMUSCULAR | Status: AC
  Administered 2016-10-04: 125 mg via INTRAVENOUS

## 2016-10-04 NOTE — Discharge Instructions (Signed)
You have an albuterol inh you will need to use this as needed for sob Your chest x ray did not show any pneumonia We gave you a steroid shot today to help  I gave a list of pcp for you to see and have a follow up  If your sx persist you will need to go to the ER.

## 2016-10-04 NOTE — ED Provider Notes (Signed)
CSN: [id number]     Arrival date & time 10/04/16  1424 History   First MD Initiated Contact with Patient 10/04/16 1528     Chief Complaint  Patient presents with  . Cough   (Consider location/radiation/quality/duration/timing/severity/associated sxs/prior Treatment) Pt c/o sob, cough, congestion for 10 days was given prednisone and zpack 1 week ago. Pt states that he is worse and has been running a fever. Talking in 4 word sentence, wheezing. Has an albuterol inhaler at home that he used this am with minimal relief. Pt denies ever smoking but does have a hx of asthma. Ox sats 96% RA.       Past Medical History:  Diagnosis Date  . Allergic rhinitis   . Anemia   . Arthritis    rhuematoid and osteo arthritis  . Chronic headache    migraines  . GERD (gastroesophageal reflux disease)   . History of benign prostatic hypertrophy   . Hyperlipidemia   . Hypertension   . Restless leg   . Tricuspid regurgitation   . Unspecified asthma(493.90)    Past Surgical History:  Procedure Laterality Date  . INGUINAL HERNIA REPAIR    . KNEE ARTHROSCOPY    . PATELLA-FEMORAL ARTHROPLASTY Left 07/14/2016   Procedure: LEFT KNEE PATELLA-FEMORAL  REPLACEMENT ARTHROPLASTY;  Surgeon: Loreta Ave, MD;  Location: [facility];  Service: Orthopedics;  Laterality: Left;  . TENDON REPAIR    . TONSILLECTOMY     Family History  Problem Relation Age of Onset  . Prostate cancer Father   . Hypertension Father   . Heart failure Father   . Leukemia Father   . Lung cancer Mother   . Obesity Mother   . Colon cancer Maternal Grandmother   . Diabetes Other     FH- maternal side  . Prostate cancer Other     FH-maternal side   Social History  Substance Use Topics  . Smoking status: Former Games developer  . Smokeless tobacco: Never Used     Comment: stopped at age 52  . Alcohol use Yes     Comment: 2 drinks per day    Review of Systems  Constitutional: Positive for fever.  HENT: Positive for  congestion.   Eyes: Negative.   Respiratory: Positive for cough, shortness of breath and wheezing.   Cardiovascular: Negative.   Gastrointestinal: Negative.   Musculoskeletal: Negative.   Skin: Negative.   Neurological: Negative.     Allergies  Celebrex [celecoxib]; Percocet [oxycodone-acetaminophen]; Sulfonamide derivatives; and Latex  Home Medications   Prior to Admission medications   Medication Sig Start Date End Date Taking? Authorizing Provider  Arginine (L-ARGININE-500) 500 MG CAPS Take by mouth every morning.    Historical Provider, MD  Calcium-Magnesium-Vitamin D (CALCIUM MAGNESIUM PO) Take by mouth.    Historical Provider, MD  HYDROcodone-acetaminophen (NORCO) 7.5-325 MG tablet Take 1 tablet by mouth every 6 (six) hours as needed for moderate pain. 07/14/16   Clarene Critchley, PA-C  methocarbamol (ROBAXIN) 500 MG tablet Take 1 tablet (500 mg total) by mouth every 6 (six) hours as needed for muscle spasms. 07/14/16   Clarene Critchley, PA-C  Misc Natural Products [facility] CHINESE PO) Take 648 mg by mouth every morning.    Historical Provider, MD  UNABLE TO FIND Med Name: unknown    Historical Provider, MD   Meds Ordered and Administered this Visit   Medications  albuterol (PROVENTIL) (2.5 MG/3ML) 0.083% nebulizer solution 2.5 mg (not administered)  ipratropium (ATROVENT) nebulizer solution  0.5 mg (not administered)  methylPREDNISolone sodium succinate (SOLU-MEDROL) 125 mg/2 mL injection 125 mg (125 mg Intravenous Given 10/04/16 1555)  ipratropium-albuterol (DUONEB) 0.5-2.5 (3) MG/3ML nebulizer solution 3 mL (3 mLs Nebulization Given 10/04/16 1555)    BP 147/89   Pulse 84   Temp 98.2 F (36.8 C)   Resp 20   SpO2 96%  No data found.   Physical Exam  Constitutional: He appears well-developed and well-nourished.  HENT:  Head: Normocephalic.  Eyes: Pupils are equal, round, and reactive to light.  Neck: Normal range of motion.  Cardiovascular: Regular rhythm.    Pulmonary/Chest: He has wheezes. He has rales.  Talking in 4 word phrases, sob nonproductive cough, wheezing in all fields, sats 96% ra,   Abdominal: Soft. Bowel sounds are normal.  Neurological: He is alert.  Skin: Skin is warm. Capillary refill takes 2 to 3 seconds.    Urgent Care Course   Clinical Course     Procedures (including critical care time)  Labs Review Labs Reviewed - No data to display  Imaging Review Dg Chest 2 View  Result Date: 10/04/2016 CLINICAL DATA:  Cough and congestion for 10 days, wheezing, hypertension, former smoker EXAM: CHEST  2 VIEW COMPARISON:  08/03/2011 FINDINGS: Normal heart size, mediastinal contours and pulmonary vascularity. Lungs clear. No pleural effusion or pneumothorax. No acute osseous findings. IMPRESSION: No acute abnormalities. Electronically Signed   By: Ulyses Southward M.D.   On: 10/04/2016 15:34             MDM   1. Moderate persistent asthma with exacerbation   2. Cough    You have an albuterol inh you will need to use this as needed for sob Your chest x ray did not show any pneumonia We gave you a steroid shot today to help  I gave a list of pcp for you to see and have a follow up  If your sx persist you will need to go to the ER. Reviewed previous charts pt states that his hx is not correct he was never a smoker. But does have season triggers for allergies, Handed off pt with report to D. Mabe NP     Tobi Bastos, NP 10/04/16 1558    Tobi Bastos, NP 10/04/16 1600

## 2016-10-04 NOTE — ED Triage Notes (Signed)
Pt here for 10 days of cough, wheezing, SOB. sts treated with z pac but not better. sts getting worse.

## 2016-10-09 ENCOUNTER — Encounter (HOSPITAL_COMMUNITY): Payer: Self-pay | Admitting: Emergency Medicine

## 2016-10-09 ENCOUNTER — Emergency Department (HOSPITAL_COMMUNITY): Payer: BLUE CROSS/BLUE SHIELD

## 2016-10-09 ENCOUNTER — Emergency Department (HOSPITAL_COMMUNITY)
Admission: EM | Admit: 2016-10-09 | Discharge: 2016-10-10 | Disposition: A | Discharge status: Home / Self Care | Payer: BLUE CROSS/BLUE SHIELD | Attending: Emergency Medicine | Admitting: Emergency Medicine

## 2016-10-09 DIAGNOSIS — B349 Viral infection, unspecified: Secondary | ICD-10-CM

## 2016-10-09 DIAGNOSIS — R197 Diarrhea, unspecified: Secondary | ICD-10-CM | POA: Insufficient documentation

## 2016-10-09 DIAGNOSIS — Z87891 Personal history of nicotine dependence: Secondary | ICD-10-CM | POA: Diagnosis not present

## 2016-10-09 DIAGNOSIS — I10 Essential (primary) hypertension: Secondary | ICD-10-CM | POA: Diagnosis not present

## 2016-10-09 DIAGNOSIS — R062 Wheezing: Secondary | ICD-10-CM | POA: Diagnosis not present

## 2016-10-09 DIAGNOSIS — J9801 Acute bronchospasm: Secondary | ICD-10-CM | POA: Insufficient documentation

## 2016-10-09 DIAGNOSIS — Z91040 Latex allergy status: Secondary | ICD-10-CM | POA: Diagnosis not present

## 2016-10-09 DIAGNOSIS — J069 Acute upper respiratory infection, unspecified: Secondary | ICD-10-CM | POA: Diagnosis not present

## 2016-10-09 DIAGNOSIS — B9789 Other viral agents as the cause of diseases classified elsewhere: Secondary | ICD-10-CM

## 2016-10-09 DIAGNOSIS — R1084 Generalized abdominal pain: Secondary | ICD-10-CM | POA: Diagnosis present

## 2016-10-09 LAB — LIPASE, BLOOD: Lipase: 22 U/L (ref 11–51)

## 2016-10-09 LAB — URINALYSIS, ROUTINE W REFLEX MICROSCOPIC
BILIRUBIN URINE: NEGATIVE
Glucose, UA: NEGATIVE mg/dL
KETONES UR: NEGATIVE mg/dL
LEUKOCYTES UA: NEGATIVE
NITRITE: NEGATIVE
PH: 5 (ref 5.0–8.0)
PROTEIN: NEGATIVE mg/dL
Specific Gravity, Urine: 1.025 (ref 1.005–1.030)

## 2016-10-09 LAB — COMPREHENSIVE METABOLIC PANEL
ALBUMIN: 3.7 g/dL (ref 3.5–5.0)
ALT: 25 U/L (ref 17–63)
AST: 24 U/L (ref 15–41)
Alkaline Phosphatase: 83 U/L (ref 38–126)
Anion gap: 10 (ref 5–15)
BILIRUBIN TOTAL: 0.8 mg/dL (ref 0.3–1.2)
BUN: 17 mg/dL (ref 6–20)
CALCIUM: 8.8 mg/dL — AB (ref 8.9–10.3)
CO2: 23 mmol/L (ref 22–32)
CREATININE: 1.19 mg/dL (ref 0.61–1.24)
Chloride: 101 mmol/L (ref 101–111)
GFR calc Af Amer: >60 mL/min (ref >60)
GLUCOSE: 126 mg/dL — AB (ref 65–99)
Potassium: 4.4 mmol/L (ref 3.5–5.1)
Sodium: 134 mmol/L — ABNORMAL LOW (ref 135–145)
TOTAL PROTEIN: 7 g/dL (ref 6.5–8.1)

## 2016-10-09 LAB — URINALYSIS, MICROSCOPIC (REFLEX)
BACTERIA UA: NONE SEEN
RBC / HPF: NONE SEEN RBC/hpf (ref 0–5)

## 2016-10-09 LAB — CBC
HEMATOCRIT: 42.9 % (ref 39.0–52.0)
Hemoglobin: 14.6 g/dL (ref 13.0–17.0)
MCH: 31.4 pg (ref 26.0–34.0)
MCHC: 34 g/dL (ref 30.0–36.0)
MCV: 92.3 fL (ref 78.0–100.0)
PLATELETS: 313 10*3/uL (ref 150–400)
RBC: 4.65 MIL/uL (ref 4.22–5.81)
RDW: 13.7 % (ref 11.5–15.5)
WBC: 10.6 10*3/uL — AB (ref 4.0–10.5)

## 2016-10-09 LAB — BRAIN NATRIURETIC PEPTIDE: B NATRIURETIC PEPTIDE 5: 13 pg/mL (ref 0.0–100.0)

## 2016-10-09 MED ORDER — ALBUTEROL SULFATE (2.5 MG/3ML) 0.083% IN NEBU
2.5000 mg | INHALATION_SOLUTION | Freq: Once | RESPIRATORY_TRACT | Status: AC
  Administered 2016-10-09: 2.5 mg via RESPIRATORY_TRACT
  Filled 2016-10-09: qty 3

## 2016-10-09 MED ORDER — GI COCKTAIL ~~LOC~~
30.0000 mL | Freq: Once | ORAL | Status: AC
  Administered 2016-10-09: 30 mL via ORAL
  Filled 2016-10-09: qty 30

## 2016-10-09 MED ORDER — IPRATROPIUM-ALBUTEROL 0.5-2.5 (3) MG/3ML IN SOLN
3.0000 mL | Freq: Once | RESPIRATORY_TRACT | Status: AC
  Administered 2016-10-09: 3 mL via RESPIRATORY_TRACT
  Filled 2016-10-09: qty 3

## 2016-10-09 MED ORDER — METHYLPREDNISOLONE SODIUM SUCC 125 MG IJ SOLR
125.0000 mg | Freq: Once | INTRAMUSCULAR | Status: AC
  Administered 2016-10-09: 125 mg via INTRAVENOUS
  Filled 2016-10-09: qty 2

## 2016-10-09 MED ORDER — ONDANSETRON HCL 4 MG/2ML IJ SOLN
4.0000 mg | Freq: Once | INTRAMUSCULAR | Status: AC
  Administered 2016-10-09: 4 mg via INTRAVENOUS
  Filled 2016-10-09: qty 2

## 2016-10-09 NOTE — ED Triage Notes (Signed)
Patient reports intermittent generalized abdominal pain with mild swelling onset 2 weeks ago with nausea and occasional diarrhea . Denies fever or chills .

## 2016-10-09 NOTE — ED Notes (Signed)
Sent label for add on BNP

## 2016-10-09 NOTE — ED Provider Notes (Signed)
[facility]-EMERGENCY DEPT Provider Note   CSN: [id number] Arrival date & time: 10/09/16  1847    History   Chief Complaint Chief Complaint  Patient presents with  . Abdominal Pain    HPI [name] is a 55 y.o. male.   55 year old male with a history of anemia, esophageal reflux, dyslipidemia, hypertension, tricuspid regurgitation, and restless leg presents to the emergency department for complaints of abdominal pain. Patient states that he has been having generalized abdominal discomfort. He has also noted some pain on the right side of his upper abdomen/lower chest which is worse with coughing. Patient complaining of multiple days of watery diarrhea. This has been waxing and waning in severity. He denies any recent antibiotic use, travel, or ingestion from questionable water sources. He has not had any hematochezia or melena. He has felt nauseous at times, but denies vomiting. He believes that his nausea is secondary to worsening reflux. Patient is also complaining of secondary shortness of breath. The symptoms have been persistent over the past week. He did present to her urgent care for this 4 days ago and has been self managing symptoms at home with albuterol as needed. He states that he had pneumonia about one month ago. He was started on a course of doxycycline and azithromycin, but was unable to tolerate the doxycycline secondary to vomiting. He has had temperatures of up to 101F at home, but denies fever today. No reported sick contacts. No hemoptysis, recent surgeries, or recent hospitalizations. He denies lower extremity edema.     Past Medical History:  Diagnosis Date  . Allergic rhinitis   . Anemia   . Arthritis    rhuematoid and osteo arthritis  . Chronic headache    migraines  . GERD (gastroesophageal reflux disease)   . History of benign prostatic hypertrophy   . Hyperlipidemia   . Hypertension   . Restless leg   . Tricuspid regurgitation   . Unspecified  asthma(493.90)     Patient Active Problem List   Diagnosis Date Noted  . Bronchitis 08/09/2011  . Chest pain 07/13/2011  . Hypertension 07/13/2011  . Hyperlipidemia 07/13/2011  . Skin lesions, generalized 06/05/2011  . Erectile dysfunction 06/05/2011  . NECK PAIN 10/30/2007  . BACK PAIN WITH RADICULOPATHY 08/20/2007  . ALLERGIC RHINITIS 06/25/2007  . REACTIVE AIRWAY DISEASE 06/25/2007  . HEADACHE, CHRONIC 06/25/2007  . BENIGN PROSTATIC HYPERTROPHY, HX OF 06/25/2007  . Other postprocedural status(V45.89) 06/25/2007    Past Surgical History:  Procedure Laterality Date  . INGUINAL HERNIA REPAIR    . KNEE ARTHROSCOPY    . PATELLA-FEMORAL ARTHROPLASTY Left 07/14/2016   Procedure: LEFT KNEE PATELLA-FEMORAL  REPLACEMENT ARTHROPLASTY;  Surgeon: Loreta Ave, MD;  Location: [facility];  Service: Orthopedics;  Laterality: Left;  . TENDON REPAIR    . TONSILLECTOMY         Home Medications    Prior to Admission medications   Medication Sig Start Date End Date Taking? Authorizing Provider  Arginine (L-ARGININE-500) 500 MG CAPS Take by mouth every morning.    Historical Provider, MD  benzonatate (TESSALON) 100 MG capsule Take 1 capsule (100 mg total) by mouth 3 (three) times daily as needed for cough. 10/10/16   Antony Madura, PA-C  Calcium-Magnesium-Vitamin D (CALCIUM MAGNESIUM PO) Take by mouth.    Historical Provider, MD  HYDROcodone-acetaminophen (NORCO) 7.5-325 MG tablet Take 1 tablet by mouth every 6 (six) hours as needed for moderate pain. 07/14/16   Clarene Critchley,  PA-C  Lactobacillus (LACTINEX) PACK Mix 1/2 packet with soft food and take twice a day for 5 days 10/10/16   Antony Madura, PA-C  methocarbamol (ROBAXIN) 500 MG tablet Take 1 tablet (500 mg total) by mouth every 6 (six) hours as needed for muscle spasms. 07/14/16   Clarene Critchley, PA-C  Misc Natural Products [facility] CHINESE PO) Take 648 mg by mouth every morning.    Historical Provider, MD  omeprazole  (PRILOSEC) 20 MG capsule Take 1 capsule (20 mg total) by mouth daily. 10/10/16   Antony Madura, PA-C  predniSONE (DELTASONE) 20 MG tablet Take 2 tablets (40 mg total) by mouth daily. 10/10/16   Antony Madura, PA-C  sucralfate (CARAFATE) 1 g tablet Take 1 tablet (1 g total) by mouth 4 (four) times daily -  with meals and at bedtime. 10/10/16   Antony Madura, PA-C  UNABLE TO FIND Med Name: **    Historical Provider, MD    Family History Family History  Problem Relation Age of Onset  . Prostate cancer Father   . Hypertension Father   . Heart failure Father   . Leukemia Father   . Lung cancer Mother   . Obesity Mother   . Colon cancer Maternal Grandmother   . Diabetes Other     FH- maternal side  . Prostate cancer Other     FH-maternal side    Social History Social History  Substance Use Topics  . Smoking status: Former Games developer  . Smokeless tobacco: Never Used     Comment: stopped at age 21  . Alcohol use Yes     Comment: 2 drinks per day     Allergies   Celebrex [celecoxib]; Percocet [oxycodone-acetaminophen]; Sulfonamide derivatives; and Latex   Review of Systems Review of Systems Ten systems reviewed and are negative for acute change, except as noted in the HPI.    Physical Exam Updated Vital Signs BP 146/95   Pulse 103   Temp 97.7 F (36.5 C) (Oral)   Resp 15   Ht 5\' 7"  (1.702 m)   Wt 81.3 kg   SpO2 97%   BMI 28.05 kg/m   Physical Exam  Constitutional: He is oriented to person, place, and time. He appears well-developed and well-nourished. No distress.  Patient pleasant and in no acute distress  HENT:  Head: Normocephalic and atraumatic.  Eyes: Conjunctivae and EOM are normal. No scleral icterus.  Neck: Normal range of motion.  Cardiovascular: Normal rate, regular rhythm and intact distal pulses.   Pulmonary/Chest: Effort normal. No respiratory distress. He has wheezes.  Chest expansion symmetric. Mild tachypnea. Patient speaking in slightly truncated sentences.  Diffuse expiratory wheezing and coarse breath sounds. No accessory muscle use.  Abdominal: Soft. He exhibits no mass. There is no guarding.  Soft, nondistended abdomen. There is mild, generalized tenderness. No focal tenderness or peritoneal signs. No masses.  Musculoskeletal: Normal range of motion.  Neurological: He is alert and oriented to person, place, and time. He exhibits normal muscle tone. Coordination normal.  GCS 15. Patient moving all extremities.  Skin: Skin is warm and dry. No rash noted. He is not diaphoretic. No erythema. No pallor.  Psychiatric: He has a normal mood and affect. His behavior is normal.  Nursing note and vitals reviewed.    ED Treatments / Results  Labs (all labs ordered are listed, but only abnormal results are displayed) Labs Reviewed  COMPREHENSIVE METABOLIC PANEL - Abnormal; Notable for the following:  Result Value   Sodium 134 (*)    Glucose, Bld 126 (*)    Calcium 8.8 (*)    All other components within normal limits  CBC - Abnormal; Notable for the following:    WBC 10.6 (*)    All other components within normal limits  URINALYSIS, ROUTINE W REFLEX MICROSCOPIC - Abnormal; Notable for the following:    APPearance HAZY (*)    Hgb urine dipstick SMALL (*)    All other components within normal limits  URINALYSIS, MICROSCOPIC (REFLEX) - Abnormal; Notable for the following:    Squamous Epithelial / LPF 0-5 (*)    All other components within normal limits  LIPASE, BLOOD  BRAIN NATRIURETIC PEPTIDE    EKG  EKG Interpretation None       Radiology Dg Chest 2 View  Result Date: 10/09/2016 CLINICAL DATA:  Cough over the last 2 weeks.  Wheezing. EXAM: CHEST  2 VIEW COMPARISON:  10/04/2016.  08/03/2011. FINDINGS: Heart size is normal. Mediastinal shadows are normal. The lungs are clear. No bronchial thickening. No infiltrate, mass, effusion or collapse. Pulmonary vascularity is normal. No bony abnormality. IMPRESSION: Normal chest  Electronically Signed   By: Paulina Fusi M.D.   On: 10/09/2016 21:40    Procedures Procedures (including critical care time)  Medications Ordered in ED Medications  methylPREDNISolone sodium succinate (SOLU-MEDROL) 125 mg/2 mL injection 125 mg (125 mg Intravenous Given 10/09/16 2249)  ipratropium-albuterol (DUONEB) 0.5-2.5 (3) MG/3ML nebulizer solution 3 mL (3 mLs Nebulization Given 10/09/16 2237)  albuterol (PROVENTIL) (2.5 MG/3ML) 0.083% nebulizer solution 2.5 mg (2.5 mg Nebulization Given 10/09/16 2237)  gi cocktail (Maalox,Lidocaine,Donnatal) (30 mLs Oral Given 10/09/16 2253)  ondansetron (ZOFRAN) injection 4 mg (4 mg Intravenous Given 10/09/16 2252)  ipratropium-albuterol (DUONEB) 0.5-2.5 (3) MG/3ML nebulizer solution 3 mL (3 mLs Nebulization Given 10/09/16 2338)  albuterol (PROVENTIL) (2.5 MG/3ML) 0.083% nebulizer solution 2.5 mg (2.5 mg Nebulization Given 10/09/16 2338)  albuterol (PROVENTIL HFA;VENTOLIN HFA) 108 (90 Base) MCG/ACT inhaler 2 puff (2 puffs Inhalation Given 10/10/16 0048)    0030 - Patient reassessed. States that he is feeling better. Lung sounds improved. No accessory muscle use or tachypnea. He ambulates without hypoxia. SpO2 never below 95%, average 97-98%.  Initial Impression / Assessment and Plan / ED Course  I have reviewed the triage vital signs and the nursing notes.  Pertinent labs & imaging results that were available during my care of the patient were reviewed by me and considered in my medical decision making (see chart for details).  Clinical Course     Patient presenting for multiple symptoms, including cough, SOB, and diarrhea. He was seen for URI symptoms at urgent care 5 days ago. Has been taking prednisone and using albuterol at home. Wheezing heard diffusely on initial evaluation. CXR negative for acute infiltrate. No fever or leukocytosis today. Suspect viral etiology.   Breathing has improved with DuoNebs. Patient is also able to ambulate without  hypoxia. Reflux has improved with GI cocktail. Diarrhea also likely due to viral process. No reported melena or hematochezia. No signs of acute surgical abdomen. Patient will be discharged with symptomatic treatment. He verbalizes understanding and is agreeable with plan. Return precautions discussed and provided. Patient discharged in stable condition with no unaddressed concerns.   Final Clinical Impressions(s) / ED Diagnoses   Final diagnoses:  Viral URI with cough  Acute bronchospasm due to viral infection  Diarrhea, unspecified type    New Prescriptions New Prescriptions   BENZONATATE (TESSALON) 100 MG  CAPSULE    Take 1 capsule (100 mg total) by mouth 3 (three) times daily as needed for cough.   LACTOBACILLUS (LACTINEX) PACK    Mix 1/2 packet with soft food and take twice a day for 5 days   OMEPRAZOLE (PRILOSEC) 20 MG CAPSULE    Take 1 capsule (20 mg total) by mouth daily.   PREDNISONE (DELTASONE) 20 MG TABLET    Take 2 tablets (40 mg total) by mouth daily.   SUCRALFATE (CARAFATE) 1 G TABLET    Take 1 tablet (1 g total) by mouth 4 (four) times daily -  with meals and at bedtime.     Antony Madura, PA-C 10/10/16 3016    Benjiman Core, MD 10/11/16 [phone]

## 2016-10-10 MED ORDER — OMEPRAZOLE 20 MG PO CPDR: 20.0000 mg | DELAYED_RELEASE_CAPSULE | Freq: Every day | ORAL | 0 refills | Status: DC

## 2016-10-10 MED ORDER — LACTINEX PO PACK: PACK | ORAL | 0 refills | Status: DC

## 2016-10-10 MED ORDER — SUCRALFATE 1 G PO TABS: 1.0000 g | ORAL_TABLET | Freq: Three times a day (TID) | ORAL | 0 refills | Status: DC

## 2016-10-10 MED ORDER — ALBUTEROL SULFATE HFA 108 (90 BASE) MCG/ACT IN AERS
2.0000 | INHALATION_SPRAY | Freq: Once | RESPIRATORY_TRACT | Status: AC
  Administered 2016-10-10: 2 via RESPIRATORY_TRACT
  Filled 2016-10-10: qty 6.7

## 2016-10-10 MED ORDER — BENZONATATE 100 MG PO CAPS: 100.0000 mg | ORAL_CAPSULE | Freq: Three times a day (TID) | ORAL | 0 refills | Status: DC | PRN

## 2016-10-10 MED ORDER — PREDNISONE 20 MG PO TABS: 40.0000 mg | ORAL_TABLET | Freq: Every day | ORAL | 0 refills | Status: DC

## 2016-10-10 NOTE — Discharge Instructions (Signed)
We recommend that you continue prednisone as prescribed. This may worsen your reflux. For this reason, we advised the use of Prilosec and Carafate. You had been prescribed Tessalon for cough. Use over-the-counter medications for congestion, as needed. Continue using an albuterol inhaler, one to 2 puffs every 4-6 hours for wheezing and shortness of breath. Take Lactinex as prescribed for diarrhea. We advise that you follow-up with a primary care doctor regarding your visit today. Return for new or concerning symptoms.

## 2016-10-10 NOTE — ED Notes (Signed)
Pt ambulated and O2 sats maintained at 97-98%

## 2019-04-30 ENCOUNTER — Encounter: Payer: Self-pay | Admitting: Gastroenterology

## 2019-05-28 ENCOUNTER — Encounter: Payer: BLUE CROSS/BLUE SHIELD | Admitting: Gastroenterology

## 2019-06-27 NOTE — Progress Notes (Deleted)
Referring-Scott Long, PA-C Reason for referral-cardiomegaly, chest pain and dyspnea  HPI: 58 year old male for evaluation of cardiomegaly, chest pain and dyspnea at request of Scott Long, PA-C.  Patient seen previously but not since November 2012.  Cardiac catheterization October 2012 showed normal LV function and 20% right coronary artery.  Seen August with complaints of atypical chest pain and dyspnea on exertion.  By report electrocardiogram normal though I do not have a copy to review.  Chest x-ray showed borderline cardiomegaly.  Cardiology asked to evaluate.  Current Outpatient Medications  Medication Sig Dispense Refill  . Arginine (L-ARGININE-500) 500 MG CAPS Take by mouth every morning.    . benzonatate (TESSALON) 100 MG capsule Take 1 capsule (100 mg total) by mouth 3 (three) times daily as needed for cough. 21 capsule 0  . Calcium-Magnesium-Vitamin D (CALCIUM MAGNESIUM PO) Take by mouth.    [name] HYDROcodone-acetaminophen (NORCO) 7.5-325 MG tablet Take 1 tablet by mouth every 6 (six) hours as needed for moderate pain. 60 tablet 0  . Lactobacillus (LACTINEX) PACK Mix 1/2 packet with soft food and take twice a day for 5 days 5 each 0  . methocarbamol (ROBAXIN) 500 MG tablet Take 1 tablet (500 mg total) by mouth every 6 (six) hours as needed for muscle spasms. 40 tablet 1  . Misc Natural Products (GINSENG-RED CHINESE PO) Take 648 mg by mouth every morning.    [name] omeprazole (PRILOSEC) 20 MG capsule Take 1 capsule (20 mg total) by mouth daily. 30 capsule 0  . predniSONE (DELTASONE) 20 MG tablet Take 2 tablets (40 mg total) by mouth daily. 10 tablet 0  . sucralfate (CARAFATE) 1 g tablet Take 1 tablet (1 g total) by mouth 4 (four) times daily -  with meals and at bedtime. 30 tablet 0  . UNABLE TO FIND Med Name: ***     No current facility-administered medications for this visit.     Allergies  Allergen Reactions  . Celebrex [Celecoxib] Itching  . Percocet [Oxycodone-Acetaminophen]   .  Sulfonamide Derivatives Hives    REACTION: causes nausea , itchy  . Latex Rash    Past Medical History:  Diagnosis Date  . Allergic rhinitis   . Anemia   . Arthritis    rhuematoid and osteo arthritis  . Chronic headache    migraines  . GERD (gastroesophageal reflux disease)   . History of benign prostatic hypertrophy   . Hyperlipidemia   . Hypertension   . Restless leg   . Tricuspid regurgitation   . Unspecified asthma(493.90)     Past Surgical History:  Procedure Laterality Date  . INGUINAL HERNIA REPAIR    . KNEE ARTHROSCOPY    . PATELLA-FEMORAL ARTHROPLASTY Left 07/14/2016   Procedure: LEFT KNEE PATELLA-FEMORAL  REPLACEMENT ARTHROPLASTY;  Surgeon: Loreta Ave, MD;  Location: [facility];  Service: Orthopedics;  Laterality: Left;  . TENDON REPAIR    . TONSILLECTOMY      Social History   Socioeconomic History  . Marital status: Single    Spouse name: Not on file  . Number of children: Not on file  . Years of education: Not on file  . Highest education level: Not on file  Occupational History  . Not on file  Social Needs  . Financial resource strain: Not on file  . Food insecurity    Worry: Not on file    Inability: Not on file  . Transportation needs    Medical: Not on file  Non-medical: Not on file  Tobacco Use  . Smoking status: Former Research scientist (life sciences)  . Smokeless tobacco: Never Used  . Tobacco comment: stopped at age 31  Substance and Sexual Activity  . Alcohol use: Yes    Comment: 2 drinks per day  . Drug use: No  . Sexual activity: Never  Lifestyle  . Physical activity    Days per week: Not on file    Minutes per session: Not on file  . Stress: Not on file  Relationships  . Social Herbalist on phone: Not on file    Gets together: Not on file    Attends religious service: Not on file    Active member of club or organization: Not on file    Attends meetings of clubs or organizations: Not on file    Relationship status:  Not on file  . Intimate partner violence    Fear of current or ex partner: Not on file    Emotionally abused: Not on file    Physically abused: Not on file    Forced sexual activity: Not on file  Other Topics Concern  . Not on file  Social History Narrative   HSG, working on college degree      single. Was employed at [employer] until December.          Family History  Problem Relation Age of Onset  . Prostate cancer Father   . Hypertension Father   . Heart failure Father   . Leukemia Father   . Lung cancer Mother   . Obesity Mother   . Colon cancer Maternal Grandmother   . Diabetes Other        FH- maternal side  . Prostate cancer Other        FH-maternal side    ROS: no fevers or chills, productive cough, hemoptysis, dysphasia, odynophagia, melena, hematochezia, dysuria, hematuria, rash, seizure activity, orthopnea, PND, pedal edema, claudication. Remaining systems are negative.  Physical Exam:   There were no vitals taken for this visit.  General:  Well developed/well nourished in NAD Skin warm/dry Patient not depressed No peripheral clubbing Back-normal HEENT-normal/normal eyelids Neck supple/normal carotid upstroke bilaterally; no bruits; no JVD; no thyromegaly chest - CTA/ normal expansion CV - RRR/normal S1 and S2; no murmurs, rubs or gallops;  PMI nondisplaced Abdomen -NT/ND, no HSM, no mass, + bowel sounds, no bruit 2+ femoral pulses, no bruits Ext-no edema, chords, 2+ DP Neuro-grossly nonfocal  ECG - personally reviewed  A/P  1 chest pain-symptoms are atypical.  I will arrange a cardiac CTA to further assess.  2 dyspnea-as outlined above we will arrange a cardiac CTA to exclude anginal equivalent.  3 hypertension-patient's blood pressure is controlled.  Continue present medications and follow.  Kirk Ruths, MD

## 2019-07-01 ENCOUNTER — Ambulatory Visit: Payer: BLUE CROSS/BLUE SHIELD | Admitting: Cardiology

## 2019-07-08 ENCOUNTER — Telehealth: Payer: Self-pay | Admitting: Cardiology

## 2019-07-08 NOTE — Telephone Encounter (Signed)
Called patient, but, VM box was full and could not leave a msg. °

## 2019-07-26 ENCOUNTER — Encounter (HOSPITAL_COMMUNITY): Payer: Self-pay

## 2019-07-26 ENCOUNTER — Ambulatory Visit (HOSPITAL_COMMUNITY)
Admission: EM | Admit: 2019-07-26 | Discharge: 2019-07-26 | Disposition: A | Discharge status: Home / Self Care | Payer: No Typology Code available for payment source | Attending: Family Medicine | Admitting: Family Medicine

## 2019-07-26 ENCOUNTER — Other Ambulatory Visit: Payer: Self-pay

## 2019-07-26 DIAGNOSIS — Z20828 Contact with and (suspected) exposure to other viral communicable diseases: Secondary | ICD-10-CM | POA: Insufficient documentation

## 2019-07-26 DIAGNOSIS — R509 Fever, unspecified: Secondary | ICD-10-CM | POA: Diagnosis present

## 2019-07-26 DIAGNOSIS — R197 Diarrhea, unspecified: Secondary | ICD-10-CM | POA: Insufficient documentation

## 2019-07-26 DIAGNOSIS — Z20822 Contact with and (suspected) exposure to covid-19: Secondary | ICD-10-CM

## 2019-07-26 MED ORDER — LOPERAMIDE HCL 2 MG PO CAPS: 2.0000 mg | ORAL_CAPSULE | Freq: Four times a day (QID) | ORAL | 0 refills | Status: DC | PRN

## 2019-07-26 NOTE — Discharge Instructions (Signed)
Push fluids.  Drink more water. You may take the loperamide for diarrhea. If your diarrhea persists, you may need to go to the ER for additional testing For possible coronavirus is important that you quarantine at home until your test result is available.  This should be 2 to 3 days The office will call all positive tests.  If you need your test result, and have not heard from this office, you may call Monday     Person Under Monitoring Name: Andre Turner  Location: [address] [place] [address]   Infection Prevention Recommendations for Individuals Confirmed to have, or Being Evaluated for, 2019 Novel Coronavirus (COVID-19) Infection Who Receive Care at Home  Individuals who are confirmed to have, or are being evaluated for, COVID-19 should follow the prevention steps below until a healthcare provider or local or state health department says they can return to normal activities.  Stay home except to get medical care You should restrict activities outside your home, except for getting medical care. Do not go to work, school, or public areas, and do not use public transportation or taxis.  Call ahead before visiting your doctor Before your medical appointment, call the healthcare provider and tell them that you have, or are being evaluated for, COVID-19 infection. This will help the healthcare providers office take steps to keep other people from getting infected. Ask your healthcare provider to call the local or state health department.  Monitor your symptoms Seek prompt medical attention if your illness is worsening (e.g., difficulty breathing). Before going to your medical appointment, call the healthcare provider and tell them that you have, or are being evaluated for, COVID-19 infection. Ask your healthcare provider to call the local or state health department.  Wear a facemask You should wear a facemask that covers your nose and mouth when you are in the same  room with other people and when you visit a healthcare provider. People who live with or visit you should also wear a facemask while they are in the same room with you.  Separate yourself from other people in your home As much as possible, you should stay in a different room from other people in your home. Also, you should use a separate bathroom, if available.  Avoid sharing household items You should not share dishes, drinking glasses, cups, eating utensils, towels, bedding, or other items with other people in your home. After using these items, you should wash them thoroughly with soap and water.  Cover your coughs and sneezes Cover your mouth and nose with a tissue when you cough or sneeze, or you can cough or sneeze into your sleeve. Throw used tissues in a lined trash can, and immediately wash your hands with soap and water for at least 20 seconds or use an alcohol-based hand rub.  Wash your [employer] your hands often and thoroughly with soap and water for at least 20 seconds. You can use an alcohol-based hand sanitizer if soap and water are not available and if your hands are not visibly dirty. Avoid touching your eyes, nose, and mouth with unwashed hands.   Prevention Steps for Caregivers and Household Members of Individuals Confirmed to have, or Being Evaluated for, COVID-19 Infection Being Cared for in the Home  If you live with, or provide care at home for, a person confirmed to have, or being evaluated for, COVID-19 infection please follow these guidelines to prevent infection:  Follow healthcare providers instructions Make sure that you understand  and can help the patient follow any healthcare provider instructions for all care.  Provide for the patients basic needs You should help the patient with basic needs in the home and provide support for getting groceries, prescriptions, and other personal needs.  Monitor the patients symptoms If they are getting sicker,  call his or her medical provider and tell them that the patient has, or is being evaluated for, COVID-19 infection. This will help the healthcare providers office take steps to keep other people from getting infected. Ask the healthcare provider to call the local or state health department.  Limit the number of people who have contact with the patient If possible, have only one caregiver for the patient. Other household members should stay in another home or place of residence. If this is not possible, they should stay in another room, or be separated from the patient as much as possible. Use a separate bathroom, if available. Restrict visitors who do not have an essential need to be in the home.  Keep older adults, very young children, and other sick people away from the patient Keep older adults, very young children, and those who have compromised immune systems or chronic health conditions away from the patient. This includes people with chronic heart, lung, or kidney conditions, diabetes, and cancer.  Ensure good ventilation Make sure that shared spaces in the home have good air flow, such as from an air conditioner or an opened window, weather permitting.  Wash your hands often Wash your hands often and thoroughly with soap and water for at least 20 seconds. You can use an alcohol based hand sanitizer if soap and water are not available and if your hands are not visibly dirty. Avoid touching your eyes, nose, and mouth with unwashed hands. Use disposable paper towels to dry your hands. If not available, use dedicated cloth towels and replace them when they become wet.  Wear a facemask and gloves Wear a disposable facemask at all times in the room and gloves when you touch or have contact with the patients blood, body fluids, and/or secretions or excretions, such as sweat, saliva, sputum, nasal mucus, vomit, urine, or feces.  Ensure the mask fits over your nose and mouth tightly, and do  not touch it during use. Throw out disposable facemasks and gloves after using them. Do not reuse. Wash your hands immediately after removing your facemask and gloves. If your personal clothing becomes contaminated, carefully remove clothing and launder. Wash your hands after handling contaminated clothing. Place all used disposable facemasks, gloves, and other waste in a lined container before disposing them with other household waste. Remove gloves and wash your hands immediately after handling these items.  Do not share dishes, glasses, or other household items with the patient Avoid sharing household items. You should not share dishes, drinking glasses, cups, eating utensils, towels, bedding, or other items with a patient who is confirmed to have, or being evaluated for, COVID-19 infection. After the person uses these items, you should wash them thoroughly with soap and water.  Wash laundry thoroughly Immediately remove and wash clothes or bedding that have blood, body fluids, and/or secretions or excretions, such as sweat, saliva, sputum, nasal mucus, vomit, urine, or feces, on them. Wear gloves when handling laundry from the patient. Read and follow directions on labels of laundry or clothing items and detergent. In general, wash and dry with the warmest temperatures recommended on the label.  Clean all areas the individual has used often Clean  all touchable surfaces, such as counters, tabletops, doorknobs, bathroom fixtures, toilets, phones, keyboards, tablets, and bedside tables, every day. Also, clean any surfaces that may have blood, body fluids, and/or secretions or excretions on them. Wear gloves when cleaning surfaces the patient has come in contact with. Use a diluted bleach solution (e.g., dilute bleach with 1 part bleach and 10 parts water) or a household disinfectant with a label that says EPA-registered for coronaviruses. To make a bleach solution at home, add 1 tablespoon of  bleach to 1 quart (4 cups) of water. For a larger supply, add  cup of bleach to 1 gallon (16 cups) of water. Read labels of cleaning products and follow recommendations provided on product labels. Labels contain instructions for safe and effective use of the cleaning product including precautions you should take when applying the product, such as wearing gloves or eye protection and making sure you have good ventilation during use of the product. Remove gloves and wash hands immediately after cleaning.  Monitor yourself for signs and symptoms of illness Caregivers and household members are considered close contacts, should monitor their health, and will be asked to limit movement outside of the home to the extent possible. Follow the monitoring steps for close contacts listed on the symptom monitoring form.   ? If you have additional questions, contact your local health department or call the epidemiologist on call at [phone] (available 24/7). ? This guidance is subject to change. For the most up-to-date guidance from [facility], please refer to their website: [email or web address]

## 2019-07-26 NOTE — ED Provider Notes (Signed)
[facility]    CSN: [id number] Arrival date & time: 07/26/19  1448      History   Chief Complaint Chief Complaint  Patient presents with   fever,body aches    HPI Andre Turner is a 58 y.o. male.   HPI  Patient states that for the last 3 days he has been sick with fever, body aches, headache.  He has had some diarrhea.  He has been taking ibuprofen around-the-clock to keep fever down.  He does take it with food.  No history of bleeding ulcers or stomach problems.  He states that he has been having diarrhea since yesterday.  Too numerous to count.  Every time he drinks even water he has diarrhea.  He states that it is foul-smelling, watery.  Brown to dark brown.  No blood or black stool.  No abdominal pain.  No nausea.  He states he has a metallic taste in his mouth.  Decreased appetite.  He states his fever has been up to 102 degrees.  No known exposure to coronavirus.  He has been eating out in restaurants.  He wears a mask.  He lives alone.  He states he works 2 jobs.  Past Medical History:  Diagnosis Date   Allergic rhinitis    Anemia    Arthritis    rhuematoid and osteo arthritis   Chronic headache    migraines   GERD (gastroesophageal reflux disease)    History of benign prostatic hypertrophy    Hyperlipidemia    Hypertension    Restless leg    Tricuspid regurgitation    Unspecified asthma(493.90)     Patient Active Problem List   Diagnosis Date Noted   Hypertension 07/13/2011   Hyperlipidemia 07/13/2011   Skin lesions, generalized 06/05/2011   Erectile dysfunction 06/05/2011   ALLERGIC RHINITIS 06/25/2007   REACTIVE AIRWAY DISEASE 06/25/2007   HEADACHE, CHRONIC 06/25/2007   BENIGN PROSTATIC HYPERTROPHY, HX OF 06/25/2007    Past Surgical History:  Procedure Laterality Date   INGUINAL HERNIA REPAIR     KNEE ARTHROSCOPY     PATELLA-FEMORAL ARTHROPLASTY Left 07/14/2016   Procedure: LEFT KNEE PATELLA-FEMORAL  REPLACEMENT  ARTHROPLASTY;  Surgeon: Loreta Ave, MD;  Location: [facility];  Service: Orthopedics;  Laterality: Left;   TENDON REPAIR     TONSILLECTOMY         Home Medications    Prior to Admission medications   Medication Sig Start Date End Date Taking? Authorizing Provider  cholecalciferol (VITAMIN D3) 25 MCG (1000 UT) tablet Take 1,000 Units by mouth daily.   Yes [provider]  metoprolol tartrate (LOPRESSOR) 50 MG tablet Take 50 mg by mouth 2 (two) times daily.   Yes [provider]  zinc gluconate 50 MG tablet Take 50 mg by mouth daily.   Yes [provider]  loperamide (IMODIUM) 2 MG capsule Take 1 capsule (2 mg total) by mouth 4 (four) times daily as needed for diarrhea or loose stools. 07/26/19   [name], MD  UNABLE TO FIND     [provider]  Calcium-Magnesium-Vitamin D (CALCIUM MAGNESIUM PO) Take by mouth.  07/26/19  [provider]  omeprazole (PRILOSEC) 20 MG capsule Take 1 capsule (20 mg total) by mouth daily. 10/10/16 07/26/19  Antony Madura, PA-C  propranolol (INDERAL) 40 MG tablet 1/2 tablet by mouth twice a day  12/27/11  [provider]  sucralfate (CARAFATE) 1 g tablet Take 1 tablet (1 g total)  by mouth 4 (four) times daily -  with meals and at bedtime. 10/10/16 07/26/19  Antony Madura, PA-C    Family History Family History  Problem Relation Age of Onset   Prostate cancer Father    Hypertension Father    Heart failure Father    Leukemia Father    Lung cancer Mother    Obesity Mother    Colon cancer Maternal Grandmother    Diabetes Other        FH- maternal side   Prostate cancer Other        FH-maternal side    Social History Social History   Tobacco Use   Smoking status: Former Smoker   Smokeless tobacco: Never Used   Tobacco comment: stopped at age 89  Substance Use Topics   Alcohol use: Yes    Comment: 2 drinks per day   Drug use: No     Allergies   Celebrex  [celecoxib], Percocet [oxycodone-acetaminophen], Sulfonamide derivatives, and Latex   Review of Systems Review of Systems  Constitutional: Positive for activity change, fatigue and fever. Negative for chills.  HENT: Negative for ear pain and sore throat.   Eyes: Negative for pain and visual disturbance.  Respiratory: Positive for cough. Negative for shortness of breath.   Cardiovascular: Negative for chest pain and palpitations.  Gastrointestinal: Positive for diarrhea. Negative for abdominal pain and vomiting.  Genitourinary: Negative for dysuria and hematuria.  Musculoskeletal: Positive for arthralgias and myalgias. Negative for back pain.  Skin: Negative for color change and rash.  Neurological: Positive for headaches. Negative for seizures and syncope.  All other systems reviewed and are negative.    Physical Exam Triage Vital Signs ED Triage Vitals  Enc Vitals Group     BP 07/26/19 1524 136/76     Pulse Rate 07/26/19 1524 92     Resp 07/26/19 1524 20     Temp 07/26/19 1527 98.4 Andre (36.9 C)     Temp Source 07/26/19 1527 Tympanic     SpO2 07/26/19 1524 (!) 88 %     Weight --      Height --      Head Circumference --      Peak Flow --      Pain Score 07/26/19 1527 0     Pain Loc --      Pain Edu? --      Excl. in GC? --    No data found.  Updated Vital Signs BP 136/76 (BP Location: Left Arm)    Pulse 92    Temp 98.4 Andre (36.9 C) (Tympanic)    Resp 20    SpO2 (!) 88%      Physical Exam Constitutional:      General: He is not in acute distress.    Appearance: He is well-developed. He is not ill-appearing.     Comments: Overweight.  No acute distress.  HENT:     Head: Normocephalic and atraumatic.     Mouth/Throat:     Pharynx: No oropharyngeal exudate or posterior oropharyngeal erythema.     Comments: Slightly dry Eyes:     Conjunctiva/sclera: Conjunctivae normal.     Pupils: Pupils are equal, round, and reactive to light.  Neck:     Musculoskeletal: Normal  range of motion and neck supple.  Cardiovascular:     Rate and Rhythm: Normal rate and regular rhythm.     Heart sounds: Normal heart sounds.  Pulmonary:     Effort: Pulmonary effort is normal.  No respiratory distress.     Breath sounds: Normal breath sounds.     Comments: Lungs are clear throughout Abdominal:     General: There is no distension.     Palpations: Abdomen is soft.     Tenderness: There is no abdominal tenderness.     Comments: Rotund abdomen.  Bowel sounds very active.  No tenderness.  Musculoskeletal: Normal range of motion.     Right lower leg: No edema.     Left lower leg: No edema.  Skin:    General: Skin is warm and dry.  Neurological:     General: No focal deficit present.     Mental Status: He is alert and oriented to person, place, and time.  Psychiatric:        Mood and Affect: Mood normal.        Behavior: Behavior normal.      UC Treatments / Results  Labs (all labs ordered are listed, but only abnormal results are displayed) Labs Reviewed  NOVEL CORONAVIRUS, NAA (HOSP ORDER, SEND-OUT TO REF LAB; TAT 18-24 HRS)    EKG   Radiology No results found.  Procedures Procedures (including critical care time)  Medications Ordered in UC Medications - No data to display  Initial Impression / Assessment and Plan / UC Course  I have reviewed the triage vital signs and the nursing notes.  Pertinent labs & imaging results that were available during my care of the patient were reviewed by me and considered in my medical decision making (see chart for details).     Expressed that this may be coronavirus.  I think in the interest of dehydration he needs to take something to slow down the diarrhea.  I recommended him Imodium.  Take just until the diarrhea slows down.  Push fluids.  Recommend Tylenol for pain and fever.  He states Tylenol does not work for him, he will continue taking ibuprofen.  I did notify him that people with coronavirus had a chance of  worse outcome on the NSAID drugs.  He will quarantine until this test result is available. Final Clinical Impressions(s) / UC Diagnoses   Final diagnoses:  Diarrhea, unspecified type  Fever, unspecified  Suspected COVID-19 virus infection     Discharge Instructions     Push fluids.  Drink more water. You may take the loperamide for diarrhea. If your diarrhea persists, you may need to go to the ER for additional testing For possible coronavirus is important that you quarantine at home until your test result is available.  This should be 2 to 3 days The office will call all positive tests.  If you need your test result, and have not heard from this office, you may call Monday     Person Under Monitoring Name: Marcellius Montagna  Location: [address] [place] [address]   Infection Prevention Recommendations for Individuals Confirmed to have, or Being Evaluated for, 2019 Novel Coronavirus (COVID-19) Infection Who Receive Care at Home  Individuals who are confirmed to have, or are being evaluated for, COVID-19 should follow the prevention steps below until a healthcare provider or local or state health department says they can return to normal activities.  Stay home except to get medical care You should restrict activities outside your home, except for getting medical care. Do not go to work, school, or public areas, and do not use public transportation or taxis.  Call ahead before visiting your doctor Before your medical appointment, call the  healthcare provider and tell them that you have, or are being evaluated for, COVID-19 infection. This will help the healthcare providers office take steps to keep other people from getting infected. Ask your healthcare provider to call the local or state health department.  Monitor your symptoms Seek prompt medical attention if your illness is worsening (e.g., difficulty breathing). Before going to your medical appointment,  call the healthcare provider and tell them that you have, or are being evaluated for, COVID-19 infection. Ask your healthcare provider to call the local or state health department.  Wear a facemask You should wear a facemask that covers your nose and mouth when you are in the same room with other people and when you visit a healthcare provider. People who live with or visit you should also wear a facemask while they are in the same room with you.  Separate yourself from other people in your home As much as possible, you should stay in a different room from other people in your home. Also, you should use a separate bathroom, if available.  Avoid sharing household items You should not share dishes, drinking glasses, cups, eating utensils, towels, bedding, or other items with other people in your home. After using these items, you should wash them thoroughly with soap and water.  Cover your coughs and sneezes Cover your mouth and nose with a tissue when you cough or sneeze, or you can cough or sneeze into your sleeve. Throw used tissues in a lined trash can, and immediately wash your hands with soap and water for at least 20 seconds or use an alcohol-based hand rub.  Wash your Union Pacific Corporationhands Wash your hands often and thoroughly with soap and water for at least 20 seconds. You can use an alcohol-based hand sanitizer if soap and water are not available and if your hands are not visibly dirty. Avoid touching your eyes, nose, and mouth with unwashed hands.   Prevention Steps for Caregivers and Household Members of Individuals Confirmed to have, or Being Evaluated for, COVID-19 Infection Being Cared for in the Home  If you live with, or provide care at home for, a person confirmed to have, or being evaluated for, COVID-19 infection please follow these guidelines to prevent infection:  Follow healthcare providers instructions Make sure that you understand and can help the patient follow any  healthcare provider instructions for all care.  Provide for the patients basic needs You should help the patient with basic needs in the home and provide support for getting groceries, prescriptions, and other personal needs.  Monitor the patients symptoms If they are getting sicker, call his or her medical provider and tell them that the patient has, or is being evaluated for, COVID-19 infection. This will help the healthcare providers office take steps to keep other people from getting infected. Ask the healthcare provider to call the local or state health department.  Limit the number of people who have contact with the patient  If possible, have only one caregiver for the patient.  Other household members should stay in another home or place of residence. If this is not possible, they should stay  in another room, or be separated from the patient as much as possible. Use a separate bathroom, if available.  Restrict visitors who do not have an essential need to be in the home.  Keep older adults, very young children, and other sick people away from the patient Keep older adults, very young children, and those who have compromised immune  systems or chronic health conditions away from the patient. This includes people with chronic heart, lung, or kidney conditions, diabetes, and cancer.  Ensure good ventilation Make sure that shared spaces in the home have good air flow, such as from an air conditioner or an opened window, weather permitting.  Wash your hands often  Wash your hands often and thoroughly with soap and water for at least 20 seconds. You can use an alcohol based hand sanitizer if soap and water are not available and if your hands are not visibly dirty.  Avoid touching your eyes, nose, and mouth with unwashed hands.  Use disposable paper towels to dry your hands. If not available, use dedicated cloth towels and replace them when they become wet.  Wear a facemask  and gloves  Wear a disposable facemask at all times in the room and gloves when you touch or have contact with the patients blood, body fluids, and/or secretions or excretions, such as sweat, saliva, sputum, nasal mucus, vomit, urine, or feces.  Ensure the mask fits over your nose and mouth tightly, and do not touch it during use.  Throw out disposable facemasks and gloves after using them. Do not reuse.  Wash your hands immediately after removing your facemask and gloves.  If your personal clothing becomes contaminated, carefully remove clothing and launder. Wash your hands after handling contaminated clothing.  Place all used disposable facemasks, gloves, and other waste in a lined container before disposing them with other household waste.  Remove gloves and wash your hands immediately after handling these items.  Do not share dishes, glasses, or other household items with the patient  Avoid sharing household items. You should not share dishes, drinking glasses, cups, eating utensils, towels, bedding, or other items with a patient who is confirmed to have, or being evaluated for, COVID-19 infection.  After the person uses these items, you should wash them thoroughly with soap and water.  Wash laundry thoroughly  Immediately remove and wash clothes or bedding that have blood, body fluids, and/or secretions or excretions, such as sweat, saliva, sputum, nasal mucus, vomit, urine, or feces, on them.  Wear gloves when handling laundry from the patient.  Read and follow directions on labels of laundry or clothing items and detergent. In general, wash and dry with the warmest temperatures recommended on the label.  Clean all areas the individual has used often  Clean all touchable surfaces, such as counters, tabletops, doorknobs, bathroom fixtures, toilets, phones, keyboards, tablets, and bedside tables, every day. Also, clean any surfaces that may have blood, body fluids, and/or  secretions or excretions on them.  Wear gloves when cleaning surfaces the patient has come in contact with.  Use a diluted bleach solution (e.g., dilute bleach with 1 part bleach and 10 parts water) or a household disinfectant with a label that says EPA-registered for coronaviruses. To make a bleach solution at home, add 1 tablespoon of bleach to 1 quart (4 cups) of water. For a larger supply, add  cup of bleach to 1 gallon (16 cups) of water.  Read labels of cleaning products and follow recommendations provided on product labels. Labels contain instructions for safe and effective use of the cleaning product including precautions you should take when applying the product, such as wearing gloves or eye protection and making sure you have good ventilation during use of the product.  Remove gloves and wash hands immediately after cleaning.  Monitor yourself for signs and symptoms of illness Caregivers and household  members are considered close contacts, should monitor their health, and will be asked to limit movement outside of the home to the extent possible. Follow the monitoring steps for close contacts listed on the symptom monitoring form.   ? If you have additional questions, contact your local health department or call the epidemiologist on call at [phone] (available 24/7). ? This guidance is subject to change. For the most up-to-date guidance from [facility], please refer to their website: TripMetro.huhttps://www.cdc.gov/coronavirus/2019-ncov/hcp/guidance-prevent-spread.html    ED Prescriptions    Medication Sig Dispense Auth. Provider   loperamide (IMODIUM) 2 MG capsule Take 1 capsule (2 mg total) by mouth 4 (four) times daily as needed for diarrhea or loose stools. 12 capsule Eustace MooreNelson, Akai Dollard Sue, MD     PDMP not reviewed this encounter.   Eustace MooreNelson, Lakeeta Dobosz Sue, MD 07/26/19 [phone]

## 2019-07-26 NOTE — ED Triage Notes (Signed)
Pt presents to UC w/ c/o headache, body aches, fevers, diarrhea x3 days. Pt states he has been taking ibuprofen to keep fever down.

## 2019-07-28 LAB — NOVEL CORONAVIRUS, NAA (HOSP ORDER, SEND-OUT TO REF LAB; TAT 18-24 HRS): SARS-CoV-2, NAA: NOT DETECTED

## 2019-07-31 ENCOUNTER — Telehealth: Payer: Self-pay | Admitting: *Deleted

## 2019-07-31 NOTE — Telephone Encounter (Signed)
A message was left, re: reschedule his new patient from 07/01/19 with Dr.Crenshaw.

## 2019-08-02 ENCOUNTER — Encounter: Payer: Self-pay | Admitting: *Deleted

## 2019-08-02 ENCOUNTER — Encounter: Payer: Self-pay | Admitting: Cardiology

## 2020-02-03 ENCOUNTER — Ambulatory Visit (HOSPITAL_COMMUNITY)
Admission: EM | Admit: 2020-02-03 | Discharge: 2020-02-03 | Disposition: A | Discharge status: Home / Self Care | Payer: No Typology Code available for payment source | Attending: Family Medicine | Admitting: Family Medicine

## 2020-02-03 ENCOUNTER — Encounter (HOSPITAL_COMMUNITY): Payer: Self-pay

## 2020-02-03 ENCOUNTER — Other Ambulatory Visit: Payer: Self-pay

## 2020-02-03 DIAGNOSIS — Z885 Allergy status to narcotic agent status: Secondary | ICD-10-CM | POA: Insufficient documentation

## 2020-02-03 DIAGNOSIS — Z8249 Family history of ischemic heart disease and other diseases of the circulatory system: Secondary | ICD-10-CM | POA: Insufficient documentation

## 2020-02-03 DIAGNOSIS — Z96652 Presence of left artificial knee joint: Secondary | ICD-10-CM | POA: Insufficient documentation

## 2020-02-03 DIAGNOSIS — Z882 Allergy status to sulfonamides status: Secondary | ICD-10-CM | POA: Diagnosis not present

## 2020-02-03 DIAGNOSIS — I10 Essential (primary) hypertension: Secondary | ICD-10-CM | POA: Diagnosis not present

## 2020-02-03 DIAGNOSIS — J40 Bronchitis, not specified as acute or chronic: Secondary | ICD-10-CM | POA: Diagnosis not present

## 2020-02-03 DIAGNOSIS — M069 Rheumatoid arthritis, unspecified: Secondary | ICD-10-CM | POA: Diagnosis not present

## 2020-02-03 DIAGNOSIS — Z87891 Personal history of nicotine dependence: Secondary | ICD-10-CM | POA: Diagnosis not present

## 2020-02-03 DIAGNOSIS — M1990 Unspecified osteoarthritis, unspecified site: Secondary | ICD-10-CM | POA: Insufficient documentation

## 2020-02-03 DIAGNOSIS — U071 COVID-19: Secondary | ICD-10-CM | POA: Insufficient documentation

## 2020-02-03 DIAGNOSIS — E785 Hyperlipidemia, unspecified: Secondary | ICD-10-CM | POA: Diagnosis not present

## 2020-02-03 DIAGNOSIS — R0602 Shortness of breath: Secondary | ICD-10-CM | POA: Diagnosis present

## 2020-02-03 DIAGNOSIS — Z886 Allergy status to analgesic agent status: Secondary | ICD-10-CM | POA: Diagnosis not present

## 2020-02-03 DIAGNOSIS — Z801 Family history of malignant neoplasm of trachea, bronchus and lung: Secondary | ICD-10-CM | POA: Diagnosis not present

## 2020-02-03 DIAGNOSIS — Z79899 Other long term (current) drug therapy: Secondary | ICD-10-CM | POA: Insufficient documentation

## 2020-02-03 DIAGNOSIS — Z91040 Latex allergy status: Secondary | ICD-10-CM | POA: Insufficient documentation

## 2020-02-03 DIAGNOSIS — K219 Gastro-esophageal reflux disease without esophagitis: Secondary | ICD-10-CM | POA: Diagnosis not present

## 2020-02-03 MED ORDER — AZITHROMYCIN 250 MG PO TABS: 250.0000 mg | ORAL_TABLET | Freq: Every day | ORAL | 0 refills | Status: DC

## 2020-02-03 MED ORDER — PREDNISONE 10 MG PO TABS: 40.0000 mg | ORAL_TABLET | Freq: Every day | ORAL | 0 refills | Status: AC

## 2020-02-03 MED ORDER — GUAIFENESIN 100 MG/5ML PO LIQD: 100.0000 mg | ORAL | 0 refills | Status: DC | PRN

## 2020-02-03 NOTE — ED Triage Notes (Signed)
Patient reports SOB starting on Thursday of last week. Reports at first he thought it was his imagination, but has continued to get worse. Reports SOB is worse while he is awake.

## 2020-02-03 NOTE — ED Provider Notes (Signed)
[facility]    CSN: [id number] Arrival date & time: 02/03/20  1093      History   Chief Complaint Chief Complaint  Patient presents with  . Shortness of Breath    HPI [name] is a 59 y.o. male.   Patient is a 59 year old male with a past medical history of allergic rhinitis, anemia, arthritis, GERD, hyperlipidemia, retention.  He presents today with close to 1 week of productive cough, mild shortness of breath.  Symptoms been constant and mildly worsening.  Shortness of breath worse when he is up and moving around.  Has had some associated chills and felt feverish.  No recorded temperature at home.  Has not taken anything for his symptoms.  Has had to use albuterol in the past for similar problems.  No recorded history of asthma or COPD.  No lower extremity swelling, history of CHF, DVT or PE.  ROS per HPI      Past Medical History:  Diagnosis Date  . Allergic rhinitis   . Anemia   . Arthritis    rhuematoid and osteo arthritis  . Chronic headache    migraines  . GERD (gastroesophageal reflux disease)   . History of benign prostatic hypertrophy   . Hyperlipidemia   . Hypertension   . Restless leg   . Tricuspid regurgitation   . Unspecified asthma(493.90)     Patient Active Problem List   Diagnosis Date Noted  . Hypertension 07/13/2011  . Hyperlipidemia 07/13/2011  . Skin lesions, generalized 06/05/2011  . Erectile dysfunction 06/05/2011  . ALLERGIC RHINITIS 06/25/2007  . REACTIVE AIRWAY DISEASE 06/25/2007  . HEADACHE, CHRONIC 06/25/2007  . BENIGN PROSTATIC HYPERTROPHY, HX OF 06/25/2007    Past Surgical History:  Procedure Laterality Date  . INGUINAL HERNIA REPAIR    . KNEE ARTHROSCOPY    . PATELLA-FEMORAL ARTHROPLASTY Left 07/14/2016   Procedure: LEFT KNEE PATELLA-FEMORAL  REPLACEMENT ARTHROPLASTY;  Surgeon: Ninetta Lights, MD;  Location: [facility];  Service: Orthopedics;  Laterality: Left;  . TENDON REPAIR    .  TONSILLECTOMY         Home Medications    Prior to Admission medications   Medication Sig Start Date End Date Taking? Authorizing Provider  azithromycin (ZITHROMAX) 250 MG tablet Take 1 tablet (250 mg total) by mouth daily. Take first 2 tablets together, then 1 every day until finished. 02/03/20   Loura Halt A, NP  cholecalciferol (VITAMIN D3) 25 MCG (1000 UT) tablet Take 1,000 Units by mouth daily.    [provider]  guaiFENesin (ROBITUSSIN) 100 MG/5ML liquid Take 5-10 mLs (100-200 mg total) by mouth every 4 (four) hours as needed for cough. 02/03/20   Loura Halt A, NP  loperamide (IMODIUM) 2 MG capsule Take 1 capsule (2 mg total) by mouth 4 (four) times daily as needed for diarrhea or loose stools. 07/26/19   Raylene Everts, MD  metoprolol tartrate (LOPRESSOR) 50 MG tablet Take 50 mg by mouth 2 (two) times daily.    [provider]  predniSONE (DELTASONE) 10 MG tablet Take 4 tablets (40 mg total) by mouth daily for 5 days. 02/03/20 02/08/20  Orvan July, NP  UNABLE TO FIND     [provider]  zinc gluconate 50 MG tablet Take 50 mg by mouth daily.    [provider]  Calcium-Magnesium-Vitamin D (CALCIUM MAGNESIUM PO) Take by mouth.  07/26/19  [provider]  omeprazole (PRILOSEC) 20 MG capsule Take 1  capsule (20 mg total) by mouth daily. 10/10/16 07/26/19  Antony Madura, PA-C  propranolol (INDERAL) 40 MG tablet 1/2 tablet by mouth twice a day  12/27/11  [provider]  sucralfate (CARAFATE) 1 g tablet Take 1 tablet (1 g total) by mouth 4 (four) times daily -  with meals and at bedtime. 10/10/16 07/26/19  Antony Madura, PA-C    Family History Family History  Problem Relation Age of Onset  . Prostate cancer Father   . Hypertension Father   . Heart failure Father   . Leukemia Father   . Lung cancer Mother   . Obesity Mother   . Colon cancer Maternal Grandmother   . Diabetes Other        FH- maternal side  . Prostate cancer Other         FH-maternal side    Social History Social History   Tobacco Use  . Smoking status: Former Games developer  . Smokeless tobacco: Never Used  . Tobacco comment: stopped at age 68  Substance Use Topics  . Alcohol use: Yes    Comment: 2 drinks per day  . Drug use: No     Allergies   Celebrex [celecoxib], Percocet [oxycodone-acetaminophen], Sulfonamide derivatives, and Latex   Review of Systems Review of Systems   Physical Exam Triage Vital Signs ED Triage Vitals  Enc Vitals Group     BP 02/03/20 0845 (!) 170/90     Pulse Rate 02/03/20 0845 81     Resp 02/03/20 0845 (!) 22     Temp 02/03/20 0845 98 F (36.7 C)     Temp Source 02/03/20 0845 Oral     SpO2 02/03/20 0845 100 %     Weight --      Height --      Head Circumference --      Peak Flow --      Pain Score 02/03/20 0842 2     Pain Loc --      Pain Edu? --      Excl. in GC? --    No data found.  Updated Vital Signs BP (!) 155/80 (BP Location: Right Arm)   Pulse 81   Temp 98 F (36.7 C) (Oral)   Resp (!) 22   SpO2 100%   Visual Acuity Right Eye Distance:   Left Eye Distance:   Bilateral Distance:    Right Eye Near:   Left Eye Near:    Bilateral Near:     Physical Exam Vitals and nursing note reviewed.  Constitutional:      General: He is not in acute distress.    Appearance: Normal appearance. He is not ill-appearing, toxic-appearing or diaphoretic.     Interventions: He is not intubated. HENT:     Head: Normocephalic and atraumatic.     Nose: Nose normal.  Eyes:     Conjunctiva/sclera: Conjunctivae normal.  Pulmonary:     Effort: Pulmonary effort is normal. No tachypnea, bradypnea, accessory muscle usage, prolonged expiration, respiratory distress or retractions. He is not intubated.     Breath sounds: Wheezing and rhonchi present.  Abdominal:     Palpations: Abdomen is soft.     Tenderness: There is no abdominal tenderness.  Musculoskeletal:        General: Normal range of motion.      Cervical back: Normal range of motion.  Skin:    General: Skin is warm and dry.  Neurological:     Mental Status: He is alert.  Psychiatric:        Mood and Affect: Mood normal.      UC Treatments / Results  Labs (all labs ordered are listed, but only abnormal results are displayed) Labs Reviewed  SARS CORONAVIRUS 2 (TAT 6-24 HRS)    EKG   Radiology No results found.  Procedures Procedures (including critical care time)  Medications Ordered in UC Medications - No data to display  Initial Impression / Assessment and Plan / UC Course  I have reviewed the triage vital signs and the nursing notes.  Pertinent labs & imaging results that were available during my care of the patient were reviewed by me and considered in my medical decision making (see chart for details).     Bronchitis Treating with prednisone burst, Zithromax and guaifenesin cough medication Patient already has an albuterol inhaler he can use at home as needed for cough, wheezing or shortness of breath. Follow up as needed for continued or worsening symptoms  Final Clinical Impressions(s) / UC Diagnoses   Final diagnoses:  Bronchitis     Discharge Instructions     Treating you for bronchitis Take medication as prescribed.  Use your inhaler as needed for cough, wheezing or shortness of breath Follow up as needed for continued or worsening symptoms     ED Prescriptions    Medication Sig Dispense Auth. Provider   azithromycin (ZITHROMAX) 250 MG tablet Take 1 tablet (250 mg total) by mouth daily. Take first 2 tablets together, then 1 every day until finished. 6 tablet Kj Imbert A, NP   predniSONE (DELTASONE) 10 MG tablet Take 4 tablets (40 mg total) by mouth daily for 5 days. 20 tablet Makeyla Govan A, NP   guaiFENesin (ROBITUSSIN) 100 MG/5ML liquid Take 5-10 mLs (100-200 mg total) by mouth every 4 (four) hours as needed for cough. 60 mL Huntley Demedeiros A, NP     PDMP not reviewed this encounter.    Dahlia Byes A, NP 02/03/20 [phone]

## 2020-02-03 NOTE — Discharge Instructions (Addendum)
Treating you for bronchitis Take medication as prescribed.  Use your inhaler as needed for cough, wheezing or shortness of breath Follow up as needed for continued or worsening symptoms

## 2020-02-04 LAB — SARS CORONAVIRUS 2 (TAT 6-24 HRS): SARS Coronavirus 2: POSITIVE — AB

## 2020-02-05 ENCOUNTER — Telehealth: Payer: Self-pay | Admitting: Adult Health

## 2020-02-05 NOTE — Telephone Encounter (Signed)
Called to discuss with Andre Turner about Covid symptoms and the use of bamlanivimab, a monoclonal antibody infusion for those with mild to moderate Covid symptoms and at a high risk of hospitalization.     Unable to reach , left voicemail to call back on infusion line.   Congetta Odriscoll NP-C  [facility]   02/05/2020

## 2020-02-05 NOTE — Telephone Encounter (Signed)
Called and [facility] for Andre Turner on his cell phone.  His Home phone number was not in service.  He is eligible to receive treatment for COVID19 with monoclonal antibody therapy due to his age and heart issues.  I asked that he call back so we can discuss treatment further.   Lillard Anes, NP

## 2020-02-06 ENCOUNTER — Encounter (INDEPENDENT_AMBULATORY_CARE_PROVIDER_SITE_OTHER): Payer: Self-pay | Admitting: Adult Health

## 2020-02-06 ENCOUNTER — Telehealth (INDEPENDENT_AMBULATORY_CARE_PROVIDER_SITE_OTHER): Payer: Self-pay | Admitting: Adult Health

## 2020-02-06 ENCOUNTER — Telehealth: Payer: Self-pay | Admitting: Physician Assistant

## 2020-02-06 NOTE — Telephone Encounter (Signed)
Called todiscuss with F Jenetta Loges about Covid symptoms and the use of bamlanivimab, a monoclonal antibody infusion for those with mild to moderate Covid symptoms and at a high risk of hospitalization.  Eligible for infusion: >55 yrs with HTN.  Patient Active Problem List   Diagnosis Date Noted  . Hypertension 07/13/2011  . Hyperlipidemia 07/13/2011  . Skin lesions, generalized 06/05/2011  . Erectile dysfunction 06/05/2011  . ALLERGIC RHINITIS 06/25/2007  . REACTIVE AIRWAY DISEASE 06/25/2007  . HEADACHE, CHRONIC 06/25/2007  . BENIGN PROSTATIC HYPERTROPHY, HX OF 06/25/2007   Unable to reach pt.  MyChart Message sent to pt.  William Hamburger, NP-C

## 2020-02-06 NOTE — Telephone Encounter (Signed)
Called to discuss with Andre Turner about Covid symptoms and the use of bamlanivimab/etesevimab or casirivimab/imdevimab, a monoclonal antibody infusion for those with mild to moderate Covid symptoms and at a high risk of hospitalization.     Pt does not qualify for infusion therapy as pt's symptoms first presented > 10 days prior to timing of infusion. Symptoms tier reviewed as well as criteria for ending isolation. Preventative practices reviewed. Patient verbalized understanding  Patient Active Problem List   Diagnosis Date Noted  . Hypertension 07/13/2011  . Hyperlipidemia 07/13/2011  . Skin lesions, generalized 06/05/2011  . Erectile dysfunction 06/05/2011  . ALLERGIC RHINITIS 06/25/2007  . REACTIVE AIRWAY DISEASE 06/25/2007  . HEADACHE, CHRONIC 06/25/2007  . BENIGN PROSTATIC HYPERTROPHY, HX OF 06/25/2007    Cline Crock PA-C

## 2020-02-25 ENCOUNTER — Encounter: Payer: Self-pay | Admitting: Gastroenterology

## 2020-05-01 ENCOUNTER — Encounter: Payer: Self-pay | Admitting: Gastroenterology

## 2020-05-01 ENCOUNTER — Ambulatory Visit (INDEPENDENT_AMBULATORY_CARE_PROVIDER_SITE_OTHER): Payer: No Typology Code available for payment source | Admitting: Gastroenterology

## 2020-05-01 ENCOUNTER — Other Ambulatory Visit (INDEPENDENT_AMBULATORY_CARE_PROVIDER_SITE_OTHER): Payer: No Typology Code available for payment source

## 2020-05-01 VITALS — BP 128/74 | HR 88 | Ht 66.0 in | Wt 236.4 lb

## 2020-05-01 DIAGNOSIS — R197 Diarrhea, unspecified: Secondary | ICD-10-CM

## 2020-05-01 DIAGNOSIS — Z1212 Encounter for screening for malignant neoplasm of rectum: Secondary | ICD-10-CM

## 2020-05-01 DIAGNOSIS — Z1211 Encounter for screening for malignant neoplasm of colon: Secondary | ICD-10-CM

## 2020-05-01 LAB — IGA: IgA: 126 mg/dL (ref 68–378)

## 2020-05-01 NOTE — Patient Instructions (Addendum)
Your provider has requested that you go to the basement level for lab work before leaving today. Press "B" on the elevator. The lab is located at the first door on the left as you exit the elevator.   Keep your CT scan appointment, cardiology and pulmonary appointments.   Continue Lomotil twice daily for diarrhea symptoms.   Keep your follow up appointment in September with Dr. Russella Dar to discuss Colonoscopy pending your other appointments.   Thank you for choosing me and [facility].  Venita Lick. Pleas Koch., MD., Clementeen Graham

## 2020-05-01 NOTE — Progress Notes (Signed)
History of Present Illness: This is a 59 year old male referred by Lindaann Pascal, PA-C for the evaluation of diarrhea and LLQ pain.  He relates problems with diarrhea for about 6 to 9 months. LLQ pain has accompanied his diarrhea.  Diarrhea worsened over the past 3 months during which time he has 5-7 loose bowel movements per day and occasionally loose bowel movements at night.  He started Lomotil twice daily as needed with good control of diarrhea.  He underwent colonoscopy in December 2007 which was normal.  He cannot relate his diarrhea to any medication or dietary stressor.  He was diagnosed with COVID-19 in April.  He was recommended to undergo monoclonal antibody infusion however he declined.  He relates that he was told that he had delta variant.  He has had difficulty with shortness of breath for several months.  Cardiology and Pulmonary appointments are scheduled in August.  Blood work in June: CBC normal, CMP BUN elevated at 24, potassium elevated at 5.4, sodium low at 132, CRP elevated at 5.9, TSH normal. Denies weight loss, constipation, change in stool caliber, melena, hematochezia, nausea, vomiting, dysphagia, reflux symptoms, chest pain.    Allergies  Allergen Reactions  . Celebrex [Celecoxib] Itching  . Percocet [Oxycodone-Acetaminophen]   . Sulfonamide Derivatives Hives    REACTION: causes nausea , itchy  . Latex Rash   Outpatient Medications Prior to Visit  Medication Sig Dispense Refill  . albuterol (PROAIR HFA) 108 (90 Base) MCG/ACT inhaler ProAir HFA 90 mcg/actuation aerosol inhaler  INHALE 1 TO 2 PUFFS EVERY 4 TO 6 HOURS AS NEEDED FOR WHEEZING    . diphenoxylate-atropine (LOMOTIL) 2.5-0.025 MG tablet Lomotil 2.5 mg-0.025 mg tablet  take 2 po qid prn diarrhea    . furosemide (LASIX) 20 MG tablet Take 20 mg by mouth.    [name] lisinopril (ZESTRIL) 10 MG tablet Take 10 mg by mouth daily.    . metoprolol tartrate (LOPRESSOR) 50 MG tablet Take 50 mg by mouth 2 (two) times daily.      . rosuvastatin (CRESTOR) 20 MG tablet Take 20 mg by mouth daily.    [name] zinc gluconate 50 MG tablet Take 50 mg by mouth daily.    [name] azithromycin (ZITHROMAX) 250 MG tablet Take 1 tablet (250 mg total) by mouth daily. Take first 2 tablets together, then 1 every day until finished. 6 tablet 0  . cholecalciferol (VITAMIN D3) 25 MCG (1000 UT) tablet Take 1,000 Units by mouth daily.    [name] guaiFENesin (ROBITUSSIN) 100 MG/5ML liquid Take 5-10 mLs (100-200 mg total) by mouth every 4 (four) hours as needed for cough. 60 mL 0  . loperamide (IMODIUM) 2 MG capsule Take 1 capsule (2 mg total) by mouth 4 (four) times daily as needed for diarrhea or loose stools. 12 capsule 0  . UNABLE TO FIND      No facility-administered medications prior to visit.   Past Medical History:  Diagnosis Date  . Allergic rhinitis   . Anemia   . Arthritis    rhuematoid and osteo arthritis  . Chronic headache    migraines  . GERD (gastroesophageal reflux disease)   . History of benign prostatic hypertrophy   . Hyperlipidemia   . Hypertension   . Restless leg   . Tricuspid regurgitation   . Unspecified asthma(493.90)    Past Surgical History:  Procedure Laterality Date  . INGUINAL HERNIA REPAIR    . KNEE ARTHROSCOPY    . PATELLA-FEMORAL ARTHROPLASTY Left 07/14/2016  Procedure: LEFT KNEE PATELLA-FEMORAL  REPLACEMENT ARTHROPLASTY;  Surgeon: Loreta Ave, MD;  Location: [facility];  Service: Orthopedics;  Laterality: Left;  . TENDON REPAIR    . TONSILLECTOMY     Social History   Socioeconomic History  . Marital status: Single    Spouse name: Not on file  . Number of children: Not on file  . Years of education: Not on file  . Highest education level: Not on file  Occupational History  . Not on file  Tobacco Use  . Smoking status: Former Games developer  . Smokeless tobacco: Never Used  . Tobacco comment: stopped at age 54  Vaping Use  . Vaping Use: Never used  Substance and Sexual Activity  .  Alcohol use: Yes    Comment: 2 drinks per day  . Drug use: No  . Sexual activity: Never  Other Topics Concern  . Not on file  Social History Narrative   HSG, working on college degree      single. Was employed at [employer] until December.         Social Determinants of Health   Financial Resource Strain:   . Difficulty of Paying Living Expenses:   Food Insecurity:   . Worried About Programme researcher, broadcasting/film/video in the Last Year:   . Barista in the Last Year:   Transportation Needs:   . Freight forwarder (Medical):   [name] Lack of Transportation (Non-Medical):   Physical Activity:   . Days of Exercise per Week:   . Minutes of Exercise per Session:   Stress:   . Feeling of Stress :   Social Connections:   . Frequency of Communication with Friends and Family:   . Frequency of Social Gatherings with Friends and Family:   . Attends Religious Services:   . Active Member of Clubs or Organizations:   . Attends Banker Meetings:   [name] Marital Status:    Family History  Problem Relation Age of Onset  . Prostate cancer Father   . Hypertension Father   . Heart failure Father   . Leukemia Father   . Lung cancer Mother   . Obesity Mother   . Colon cancer Maternal Grandmother   . Diabetes Other        FH- maternal side  . Prostate cancer Other        FH-maternal side      Review of Systems: Pertinent positive and negative review of systems were noted in the above HPI section. All other review of systems were otherwise negative.   Physical Exam: General: Well developed, well nourished, no acute distress, dyspenic at rest Head: Normocephalic and atraumatic Eyes:  sclerae anicteric, EOMI Ears: Normal auditory acuity Mouth: Not examined, mask on during Covid-19 pandemic Neck: Supple, no masses or thyromegaly Lungs: Clear throughout to auscultation Heart: Regular rate and rhythm; no murmurs, rubs or bruits Abdomen: Soft, mild LLQ tenderess and mildly distended. No  masses, hepatosplenomegaly or hernias noted. Normal Bowel sounds Rectal: Deferred to colonoscopy Musculoskeletal: Symmetrical with no gross deformities  Skin: No lesions on visible extremities Pulses:  Normal pulses noted Extremities: No clubbing, cyanosis, edema or deformities noted Neurological: Alert oriented x 4, grossly nonfocal Cervical Nodes:  No significant cervical adenopathy Inguinal Nodes: No significant inguinal adenopathy Psychological:  Alert and cooperative. Normal mood and affect   Assessment and Recommendations:  1. Diarrhea, etiology unclear. Mild LLQ tenderness. GI stool profile, tTG, IgA.  Proceed with  CT AP appt as scheduled.  Avoid lactose products, minimize/avoid caffeine and minimize/avoid raw fruits and vegetables.  Lomotil twice daily as needed.  If no etiology for diarrhea is uncovered proceed with colonoscopy to further evaluate after clearance from cardiology and pulmonary. If diarrhea resolves then elective colonoscopy for CRC screening.  REV in 6 weeks.   2. Mild hyponatremia and hyperkalemia, per PCP.  3. Dyspnea. Cardiology and Pulmonary appts in August.    cc: Lindaann Pascal, PA-C

## 2020-05-04 ENCOUNTER — Other Ambulatory Visit: Payer: No Typology Code available for payment source

## 2020-05-04 DIAGNOSIS — R197 Diarrhea, unspecified: Secondary | ICD-10-CM

## 2020-05-04 LAB — TISSUE TRANSGLUTAMINASE, IGA: (tTG) Ab, IgA: 1 U/mL

## 2020-05-05 ENCOUNTER — Telehealth: Payer: Self-pay | Admitting: Cardiology

## 2020-05-05 NOTE — Telephone Encounter (Signed)
Left message to call office

## 2020-05-05 NOTE — Telephone Encounter (Signed)
I spoke with patient. He is calling to see if Dr Jens Som recommends he have an echo prior to upcoming visit in August. Patient reports he had echo around 10-12 years ago in [place] which he states was OK.   Also had echo in the past which showed tricuspid valve regurgitation.  He does not know if there is an official report of this echo.

## 2020-05-05 NOTE — Telephone Encounter (Signed)
Haven't seen since 2012; will decide about testing at upcoming ov Olga Millers

## 2020-05-05 NOTE — Telephone Encounter (Signed)
Andre Turner is calling requesting an active request be put in for him to have an Echo performed. Please advise.

## 2020-05-05 NOTE — Telephone Encounter (Signed)
Patient returned called transferred call to [facility].

## 2020-05-05 NOTE — Telephone Encounter (Signed)
Pt.notified

## 2020-05-06 LAB — GI PROFILE, STOOL, PCR

## 2020-05-14 NOTE — Progress Notes (Signed)
Referring-Scott Long, PA-C Reason for referral-chest pain and dyspnea  HPI: 59 year old male for evaluation of chest pain and dyspnea at request of Scott Long, PA-C.  Patient seen previously but not since 2012.  Cardiac catheterization October 2012 revealed normal LV function and 20% right coronary artery.  Patient states that for the past 2 to 3 years he has had progressive dyspnea on exertion.  It occurs with vigorous activities and is relieved with rest.  He has some orthopnea and chronic bilateral lower extremity edema.  Approximately 5 months ago he had a brief episode of chest pain with activities.  He has not had syncope.  Because of the above cardiology asked to evaluate.  Current Outpatient Medications  Medication Sig Dispense Refill  . albuterol (PROAIR HFA) 108 (90 Base) MCG/ACT inhaler ProAir HFA 90 mcg/actuation aerosol inhaler  INHALE 1 TO 2 PUFFS EVERY 4 TO 6 HOURS AS NEEDED FOR WHEEZING    . diphenoxylate-atropine (LOMOTIL) 2.5-0.025 MG tablet Lomotil 2.5 mg-0.025 mg tablet  take 2 po qid prn diarrhea    . furosemide (LASIX) 20 MG tablet Take 20 mg by mouth daily as needed for fluid or edema.     [name] lisinopril (ZESTRIL) 10 MG tablet Take 10 mg by mouth daily.    . metoprolol tartrate (LOPRESSOR) 50 MG tablet Take 50 mg by mouth 2 (two) times daily.    . rosuvastatin (CRESTOR) 20 MG tablet Take 10 mg by mouth daily.     [name] zinc gluconate 50 MG tablet Take 50 mg by mouth daily.     No current facility-administered medications for this visit.    Allergies  Allergen Reactions  . Celebrex [Celecoxib] Itching  . Percocet [Oxycodone-Acetaminophen]   . Sulfa Antibiotics Nausea And Vomiting and Itching  . Sulfonamide Derivatives Hives    REACTION: causes nausea , itchy  . Tetracycline Nausea Only  . Latex Rash     Past Medical History:  Diagnosis Date  . Allergic rhinitis   . Anemia   . Arthritis    rhuematoid and osteo arthritis  . Chronic headache    migraines  .  Diverticulitis   . GERD (gastroesophageal reflux disease)   . History of benign prostatic hypertrophy   . Hyperlipidemia   . Hypertension   . Restless leg   . Tricuspid regurgitation   . Unspecified asthma(493.90)     Past Surgical History:  Procedure Laterality Date  . INGUINAL HERNIA REPAIR    . KNEE ARTHROSCOPY    . PATELLA-FEMORAL ARTHROPLASTY Left 07/14/2016   Procedure: LEFT KNEE PATELLA-FEMORAL  REPLACEMENT ARTHROPLASTY;  Surgeon: Loreta Ave, MD;  Location: [facility];  Service: Orthopedics;  Laterality: Left;  . TENDON REPAIR    . TONSILLECTOMY      Social History   Socioeconomic History  . Marital status: Single    Spouse name: Not on file  . Number of children: Not on file  . Years of education: Not on file  . Highest education level: Not on file  Occupational History  . Not on file  Tobacco Use  . Smoking status: Former Smoker    Packs/day: 3.00    Years: 1.00    Pack years: 3.00    Types: Cigarettes  . Smokeless tobacco: Never Used  . Tobacco comment: stopped at age 89  Vaping Use  . Vaping Use: Never used  Substance and Sexual Activity  . Alcohol use: Yes    Comment: 2 drinks per day  .  Drug use: No  . Sexual activity: Never  Other Topics Concern  . Not on file  Social History Narrative   HSG, working on college degree      single. Was employed at [employer] until December.         Social Determinants of Health   Financial Resource Strain:   . Difficulty of Paying Living Expenses:   Food Insecurity:   . Worried About Programme researcher, broadcasting/film/video in the Last Year:   . Barista in the Last Year:   Transportation Needs:   . Freight forwarder (Medical):   [name] Lack of Transportation (Non-Medical):   Physical Activity:   . Days of Exercise per Week:   . Minutes of Exercise per Session:   Stress:   . Feeling of Stress :   Social Connections:   . Frequency of Communication with Friends and Family:   . Frequency of Social  Gatherings with Friends and Family:   . Attends Religious Services:   . Active Member of Clubs or Organizations:   . Attends Banker Meetings:   [name] Marital Status:   Intimate Partner Violence:   . Fear of Current or Ex-Partner:   . Emotionally Abused:   [name] Physically Abused:   . Sexually Abused:     Family History  Problem Relation Age of Onset  . Prostate cancer Father   . Hypertension Father   . Heart failure Father   . Leukemia Father   . Lung cancer Mother   . Obesity Mother   . Colon cancer Maternal Grandmother   . Diabetes Other        FH- maternal side  . Prostate cancer Other        FH-maternal side    ROS: no fevers or chills, productive cough, hemoptysis, dysphasia, odynophagia, melena, hematochezia, dysuria, hematuria, rash, seizure activity,  claudication. Remaining systems are negative.  Physical Exam:   Blood pressure 132/76, pulse 74, height 5\' 6"  (1.676 m), weight 236 lb (107 kg), SpO2 96 %.  General:  Well developed/well nourished in NAD Skin warm/dry Patient not depressed No peripheral clubbing Back-normal HEENT-normal/normal eyelids Neck supple/normal carotid upstroke bilaterally; no bruits; no JVD; no thyromegaly chest - CTA/ normal expansion CV - RRR/normal S1 and S2; no murmurs, rubs or gallops;  PMI nondisplaced Abdomen -NT/ND, no HSM, no mass, + bowel sounds, no bruit 2+ femoral pulses, no bruits Ext-1+ edema, no chords, 2+ DP Neuro-grossly nonfocal  ECG -normal sinus rhythm at a rate of 74, no ST changes.  Personally reviewed  A/P  1 chest pain/dyspnea-etiology unclear.  We will arrange a cardiac CTA to rule out coronary disease.  Schedule echocardiogram to quantify LV function.  Check BNP.  He does have lower extremity edema.  I have asked him to take Lasix and potassium every other day.  Check potassium and renal function.  Note he has been seen by pulmonologist recently and had a CT of his lungs but I do not have those results  available.  2 hypertension-patient's blood pressure is controlled.  Continue present medications.  3 hyperlipidemia-continue statin.  , MD

## 2020-05-21 ENCOUNTER — Other Ambulatory Visit: Payer: Self-pay

## 2020-05-21 ENCOUNTER — Ambulatory Visit (INDEPENDENT_AMBULATORY_CARE_PROVIDER_SITE_OTHER): Payer: No Typology Code available for payment source | Admitting: Emergency Medicine

## 2020-05-21 ENCOUNTER — Encounter: Payer: Self-pay | Admitting: Emergency Medicine

## 2020-05-21 DIAGNOSIS — R0602 Shortness of breath: Secondary | ICD-10-CM | POA: Insufficient documentation

## 2020-05-21 NOTE — Progress Notes (Signed)
Subjective:    Patient ID: Andre Turner, male    DOB: 1961-01-27, 59 y.o.   MRN: [id number]  HPI 59 year old former smoker (3 pack years) with a history of rheumatoid arthritis, hypertension, allergic rhinitis, GERD, RLS.  He carries a history of possible asthma, has never really been worked up.   He is referred today for evaluation of shortness of breath.  He had COVID-19 pneumonia in April 2029. Has dyspnea that has been evolving in the last 2 yrs. He had a partial knee replacement and afterwards the knee was unstable, changed his level of exertion. He has gained about 70 lbs over the 2 yrs. When he is working, lifting objects, he has to stop due to dyspnea. Lots of trouble bending over.  He felt even worse when he had COVID as above. May be a bit better than during the acute illness but still clearly abnormal. Sometimes hears some wheeze, especially at night. No real cough. Occasional CP when he is exerting, associated with SOB. Seems to resolve with rest. May snore, no witnessed apneas to his knowledge.   CT abd/pelvis ([facility]) 05/04/2020 reviewed by me showed dependent atelectasis at both bases, as subpleural 3 mm left lower lobe nodule, small hiatal hernia, no overt evidence of interstitial lung disease post Covid.    Review of Systems As per HPI  Past Medical History:  Diagnosis Date  . Allergic rhinitis   . Anemia   . Arthritis    rhuematoid and osteo arthritis  . Chronic headache    migraines  . GERD (gastroesophageal reflux disease)   . History of benign prostatic hypertrophy   . Hyperlipidemia   . Hypertension   . Restless leg   . Tricuspid regurgitation   . Unspecified asthma(493.90)      Family History  Problem Relation Age of Onset  . Prostate cancer Father   . Hypertension Father   . Heart failure Father   . Leukemia Father   . Lung cancer Mother   . Obesity Mother   . Colon cancer Maternal Grandmother   . Diabetes Other        FH- maternal side  .  Prostate cancer Other        FH-maternal side    Mother >> lung cancer  Social History   Socioeconomic History  . Marital status: Single    Spouse name: Not on file  . Number of children: Not on file  . Years of education: Not on file  . Highest education level: Not on file  Occupational History  . Not on file  Tobacco Use  . Smoking status: Former Smoker    Packs/day: 3.00    Years: 1.00    Pack years: 3.00    Types: Cigarettes  . Smokeless tobacco: Never Used  . Tobacco comment: stopped at age 76  Vaping Use  . Vaping Use: Never used  Substance and Sexual Activity  . Alcohol use: Yes    Comment: 2 drinks per day  . Drug use: No  . Sexual activity: Never  Other Topics Concern  . Not on file  Social History Narrative   HSG, working on college degree      single. Was employed at [employer] until December.         Social Determinants of Health   Financial Resource Strain:   . Difficulty of Paying Living Expenses:   Food Insecurity:   . Worried About Programme researcher, broadcasting/film/video in the Last Year:   .  Ran Out of Food in the Last Year:   Transportation Needs:   . Freight forwarder (Medical):   [name] Lack of Transportation (Non-Medical):   Physical Activity:   . Days of Exercise per Week:   . Minutes of Exercise per Session:   Stress:   . Feeling of Stress :   Social Connections:   . Frequency of Communication with Friends and Family:   . Frequency of Social Gatherings with Friends and Family:   . Attends Religious Services:   . Active Member of Clubs or Organizations:   . Attends Banker Meetings:   [name] Marital Status:   Intimate Partner Violence:   . Fear of Current or Ex-Partner:   . Emotionally Abused:   [name] Physically Abused:   . Sexually Abused:     Has worked Holiday representative, Curator, [employer].  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  From [place]  Allergies  Allergen Reactions  . Celebrex  [Celecoxib] Itching  . Percocet [Oxycodone-Acetaminophen]   . Sulfonamide Derivatives Hives    REACTION: causes nausea , itchy  . Latex Rash     Outpatient Medications Prior to Visit  Medication Sig Dispense Refill  . albuterol (PROAIR HFA) 108 (90 Base) MCG/ACT inhaler ProAir HFA 90 mcg/actuation aerosol inhaler  INHALE 1 TO 2 PUFFS EVERY 4 TO 6 HOURS AS NEEDED FOR WHEEZING    . diphenoxylate-atropine (LOMOTIL) 2.5-0.025 MG tablet Lomotil 2.5 mg-0.025 mg tablet  take 2 po qid prn diarrhea    . furosemide (LASIX) 20 MG tablet Take 20 mg by mouth.    [name] lisinopril (ZESTRIL) 10 MG tablet Take 10 mg by mouth daily.    . metoprolol tartrate (LOPRESSOR) 50 MG tablet Take 50 mg by mouth 2 (two) times daily.    . rosuvastatin (CRESTOR) 20 MG tablet Take 20 mg by mouth daily.    [name] zinc gluconate 50 MG tablet Take 50 mg by mouth daily.     No facility-administered medications prior to visit.        Objective:   Physical Exam Vitals:   05/21/20 1508  BP: (!) 142/70  Pulse: 81  Temp: (!) 97.2 F (36.2 C)  TempSrc: Temporal  SpO2: 98%  Weight: 235 lb 9.6 oz (106.9 kg)  Height: 5\' 6"  (1.676 m)   Gen: Pleasant, obese man, in no distress,  normal affect  ENT: No lesions,  mouth clear,  oropharynx clear, no postnasal drip  Neck: No JVD, no stridor  Lungs: No use of accessory muscles, small breaths, no crackles or wheezing on normal respiration, no wheeze on forced expiration  Cardiovascular: RRR, heart sounds normal, no murmur or gallops, trace to 1+ pretibial peripheral edema  Musculoskeletal: No deformities, no cyanosis or clubbing  Neuro: alert, awake, non focal  Skin: Warm, no lesions or rash      Assessment & Plan:  Shortness of breath Multifactorial.  He has gained significant weight, has truncal obesity and this could be restrictive disease.  Also a possible history of asthma without clear work-up.  Consider secondary pulmonary hypertension given his systemic  hypertension, rheumatoid disease, possible obstructive sleep apnea.  He may merit a sleep study at some point going forward.  He is going to follow with cardiology and I suspect he will have an echocardiogram.  If evidence for PAH then we will get the sleep study.  He needs pulmonary function testing to assess for any contribution of  obstruction.  We will arrange for pulmonary function testing We will obtain a copy of your chest x-ray from urgent care if possible Agree with evaluation by Dr. Jens Som with cardiology.  You will probably have an echocardiogram We will consider a possible sleep study at some point in the future going forward depending on your other evaluation Follow with Dr. Delton Coombes next available with full pulmonary function testing on the same day.   Levy Pupa, MD, PhD 05/21/2020, 3:38 PM [facility] [phone] or if no answer [phone]

## 2020-05-21 NOTE — Assessment & Plan Note (Signed)
Multifactorial.  He has gained significant weight, has truncal obesity and this could be restrictive disease.  Also a possible history of asthma without clear work-up.  Consider secondary pulmonary hypertension given his systemic hypertension, rheumatoid disease, possible obstructive sleep apnea.  He may merit a sleep study at some point going forward.  He is going to follow with cardiology and I suspect he will have an echocardiogram.  If evidence for PAH then we will get the sleep study.  He needs pulmonary function testing to assess for any contribution of obstruction.  We will arrange for pulmonary function testing We will obtain a copy of your chest x-ray from urgent care if possible Agree with evaluation by Dr. Jens Som with cardiology.  You will probably have an echocardiogram We will consider a possible sleep study at some point in the future going forward depending on your other evaluation Follow with Dr. Delton Coombes next available with full pulmonary function testing on the same day.

## 2020-05-21 NOTE — Patient Instructions (Signed)
We will arrange for pulmonary function testing We will obtain a copy of your chest x-ray from urgent care if possible Agree with evaluation by Dr. Jens Som with cardiology.  You will probably have an echocardiogram We will consider a possible sleep study at some point in the future going forward depending on your other evaluation Follow with Dr. Delton Coombes next available with full pulmonary function testing on the same day.

## 2020-05-27 ENCOUNTER — Ambulatory Visit (INDEPENDENT_AMBULATORY_CARE_PROVIDER_SITE_OTHER): Payer: No Typology Code available for payment source | Admitting: Cardiology

## 2020-05-27 ENCOUNTER — Encounter: Payer: Self-pay | Admitting: Cardiology

## 2020-05-27 ENCOUNTER — Other Ambulatory Visit: Payer: Self-pay

## 2020-05-27 VITALS — BP 132/76 | HR 74 | Ht 66.0 in | Wt 236.0 lb

## 2020-05-27 DIAGNOSIS — I10 Essential (primary) hypertension: Secondary | ICD-10-CM

## 2020-05-27 DIAGNOSIS — R0602 Shortness of breath: Secondary | ICD-10-CM | POA: Diagnosis not present

## 2020-05-27 DIAGNOSIS — E7800 Pure hypercholesterolemia, unspecified: Secondary | ICD-10-CM

## 2020-05-27 DIAGNOSIS — R072 Precordial pain: Secondary | ICD-10-CM

## 2020-05-27 MED ORDER — FUROSEMIDE 20 MG PO TABS: 20.0000 mg | ORAL_TABLET | ORAL | 3 refills | Status: DC

## 2020-05-27 NOTE — Patient Instructions (Addendum)
Medication Instructions:   TAKE FUROSEMIDE AND POTASSIUM EVERY OTHER DAY  *If you need a refill on your cardiac medications before your next appointment, please call your pharmacy*   Lab Work:  Your physician recommends that you HAVE LAB WORK TODAY  If you have labs (blood work) drawn today and your tests are completely normal, you will receive your results only by: [name] MyChart Message (if you have MyChart) OR . A paper copy in the mail If you have any lab test that is abnormal or we need to change your treatment, we will call you to review the results.   Testing/Procedures:  Your physician has requested that you have an echocardiogram. Echocardiography is a painless test that uses sound waves to create images of your heart. It provides your doctor with information about the size and shape of your heart and how well your heart's chambers and valves are working. This procedure takes approximately one hour. There are no restrictions for this procedure.[place]  Your cardiac CT will be scheduled at one of the below locations:   [facility] [address] [place], [place] [address] [phone]  If scheduled at [facility], please arrive at the [facility] main entrance of [facility] 30 minutes prior to test start time. Proceed to the [facility] Radiology Department (first floor) to check-in and test prep.  Please follow these instructions carefully (unless otherwise directed):  Hold all erectile dysfunction medications at least 3 days (72 hrs) prior to test.  On the Night Before the Test: . Be sure to Drink plenty of water. . Do not consume any caffeinated/decaffeinated beverages or chocolate 12 hours prior to your test. . Do not take any antihistamines 12 hours prior to your test.  On the Day of the Test: . Drink plenty of water. Do not drink any water within one hour of the test. . Do not eat any food 4 hours prior to the  test. . You may take your regular medications prior to the test.  . Take metoprolol (Lopressor) 100 MG two hours prior to test. . HOLD Furosemide/Hydrochlorothiazide morning of the test.       After the Test: . Drink plenty of water. . After receiving IV contrast, you may experience a mild flushed feeling. This is normal. . On occasion, you may experience a mild rash up to 24 hours after the test. This is not dangerous. If this occurs, you can take Benadryl 25 mg and increase your fluid intake. . If you experience trouble breathing, this can be serious. If it is severe call 911 IMMEDIATELY. If it is mild, please call our office. . If you take any of these medications: Glipizide/Metformin, Avandament, Glucavance, please do not take 48 hours after completing test unless otherwise instructed.   Once we have confirmed authorization from your insurance company, we will call you to set up a date and time for your test. Based on how quickly your insurance processes prior authorizations requests, please allow up to 4 weeks to be contacted for scheduling your Cardiac CT appointment. Be advised that routine Cardiac CT appointments could be scheduled as many as 8 weeks after your provider has ordered it.  For non-scheduling related questions, please contact the cardiac imaging nurse navigator should you have any questions/concerns: Marchia Bond, Cardiac Imaging Nurse Navigator Burley Saver, Interim Cardiac Imaging Nurse [facility] Direct Office Dial: [phone]   For scheduling needs, including cancellations  and rescheduling, please call Vivien Rota at [phone], option 3.        Follow-Up: At [facility], you and your health needs are our priority.  As part of our continuing mission to provide you with exceptional heart care, we have created designated Provider Care Teams.  These Care Teams include your primary Cardiologist (physician) and Advanced Practice  Providers (APPs -  Physician Assistants and Nurse Practitioners) who all work together to provide you with the care you need, when you need it.  We recommend signing up for the patient portal called "MyChart".  Sign up information is provided on this After Visit Summary.  MyChart is used to connect with patients for Virtual Visits (Telemedicine).  Patients are able to view lab/test results, encounter notes, upcoming appointments, etc.  Non-urgent messages can be sent to your provider as well.   To learn more about what you can do with MyChart, go to [email or web address].    Your next appointment:   6 month(s)  The format for your next appointment:   In Person  Provider:   Kirk Ruths, MD

## 2020-05-28 ENCOUNTER — Encounter: Payer: Self-pay | Admitting: *Deleted

## 2020-05-28 LAB — BASIC METABOLIC PANEL
BUN/Creatinine Ratio: 20 (ref 9–20)
BUN: 19 mg/dL (ref 6–24)
CO2: 25 mmol/L (ref 20–29)
Calcium: 9.1 mg/dL (ref 8.7–10.2)
Chloride: 99 mmol/L (ref 96–106)
Creatinine, Ser: 0.93 mg/dL (ref 0.76–1.27)
GFR calc Af Amer: 104 mL/min/{1.73_m2} (ref >59)
GFR calc non Af Amer: 90 mL/min/{1.73_m2} (ref >59)
Glucose: 100 mg/dL — ABNORMAL HIGH (ref 65–99)
Potassium: 5.1 mmol/L (ref 3.5–5.2)
Sodium: 135 mmol/L (ref 134–144)

## 2020-05-28 LAB — PRO B NATRIURETIC PEPTIDE: NT-Pro BNP: 21 pg/mL (ref 0–210)

## 2020-06-03 NOTE — Addendum Note (Signed)
Addended by: Bea Laura B on: 06/03/2020 03:59 PM   Modules accepted: Orders

## 2020-06-16 ENCOUNTER — Other Ambulatory Visit: Payer: Self-pay

## 2020-06-16 ENCOUNTER — Ambulatory Visit (HOSPITAL_COMMUNITY): Payer: No Typology Code available for payment source | Attending: Cardiology

## 2020-06-16 DIAGNOSIS — R0602 Shortness of breath: Secondary | ICD-10-CM | POA: Insufficient documentation

## 2020-06-16 LAB — ECHOCARDIOGRAM COMPLETE
Area-P 1/2: 3.24 cm2
S' Lateral: 2.9 cm

## 2020-06-16 MED ORDER — PERFLUTREN LIPID MICROSPHERE
1.0000 mL | INTRAVENOUS | Status: AC | PRN
  Administered 2020-06-16: 1 mL via INTRAVENOUS

## 2020-06-23 ENCOUNTER — Telehealth (HOSPITAL_COMMUNITY): Payer: Self-pay | Admitting: Emergency Medicine

## 2020-06-23 NOTE — Telephone Encounter (Signed)
Reaching out to patient to offer assistance regarding upcoming cardiac imaging study; pt verbalizes understanding of appt date/time, parking situation and where to check in, pre-test NPO status and medications ordered, and verified current allergies; name and call back number provided for further questions should they arise Lizvet Chunn RN Navigator Cardiac Imaging [facility] [phone] office [phone] cell 

## 2020-06-24 ENCOUNTER — Ambulatory Visit (HOSPITAL_COMMUNITY)
Admission: RE | Admit: 2020-06-24 | Discharge: 2020-06-24 | Disposition: A | Discharge status: Home / Self Care | Payer: No Typology Code available for payment source | Source: Ambulatory Visit | Attending: Cardiology | Admitting: Cardiology

## 2020-06-24 ENCOUNTER — Other Ambulatory Visit: Payer: Self-pay

## 2020-06-24 DIAGNOSIS — R072 Precordial pain: Secondary | ICD-10-CM | POA: Diagnosis not present

## 2020-06-24 MED ORDER — NITROGLYCERIN 0.4 MG SL SUBL
SUBLINGUAL_TABLET | SUBLINGUAL | Status: AC
  Filled 2020-06-24: qty 2

## 2020-06-24 MED ORDER — METOPROLOL TARTRATE 5 MG/5ML IV SOLN: 5.0000 mg | INTRAVENOUS | Status: DC | PRN

## 2020-06-24 MED ORDER — METOPROLOL TARTRATE 5 MG/5ML IV SOLN
INTRAVENOUS | Status: AC
  Administered 2020-06-24: 5 mg via INTRAVENOUS
  Filled 2020-06-24: qty 15

## 2020-06-24 MED ORDER — IOHEXOL 350 MG/ML SOLN
80.0000 mL | Freq: Once | INTRAVENOUS | Status: AC | PRN
  Administered 2020-06-24: 80 mL via INTRAVENOUS

## 2020-06-24 MED ORDER — NITROGLYCERIN 0.4 MG SL SUBL
0.8000 mg | SUBLINGUAL_TABLET | Freq: Once | SUBLINGUAL | Status: AC
  Administered 2020-06-24: 0.8 mg via SUBLINGUAL

## 2020-06-24 NOTE — Progress Notes (Signed)
Heart rate 73-80 despite having taken his Metoprolol this am. Given one dose of IV Metoprolol and is nauseated and heart rate remains about 73 to 80. Gets nausea at home with po Metoprolol as well. I spoke about this with Dr Flora Lipps at 1050. Heart rate drops to 69 with breath hold. Dr Flora Lipps states to go ahead and scan patient which was done. Given snack and drink on return to nurses station. Had a headache which has resolved. Feels fine. Discharged walking.

## 2020-06-29 ENCOUNTER — Telehealth: Payer: Self-pay | Admitting: *Deleted

## 2020-06-29 ENCOUNTER — Ambulatory Visit (INDEPENDENT_AMBULATORY_CARE_PROVIDER_SITE_OTHER): Payer: No Typology Code available for payment source | Admitting: Gastroenterology

## 2020-06-29 ENCOUNTER — Encounter: Payer: Self-pay | Admitting: Gastroenterology

## 2020-06-29 VITALS — BP 122/64 | HR 72 | Wt 239.8 lb

## 2020-06-29 DIAGNOSIS — R197 Diarrhea, unspecified: Secondary | ICD-10-CM

## 2020-06-29 DIAGNOSIS — E7800 Pure hypercholesterolemia, unspecified: Secondary | ICD-10-CM

## 2020-06-29 MED ORDER — NA SULFATE-K SULFATE-MG SULF 17.5-3.13-1.6 GM/177ML PO SOLN: 1.0000 | Freq: Once | ORAL | 0 refills | Status: AC

## 2020-06-29 NOTE — Progress Notes (Signed)
    History of Present Illness: This is a returning for follow up of diarrhea. Stool profile was negative and tTG was normal with a normal IgA.  Patient relates his diarrhea has improved.  He notes that about 3 hours after taking Crestor is when he has diarrhea and then no additional episodes each day.  He discontinued Crestor for several days and his diarrhea abated.  He was evaluated by cardiology and pulmonary for shortness of breath and was felt to be multifactorial with gained weight adn truncal obesity.  Echocardiogram revealed on 9/7 normal EF 60 to 65%  Current Medications, Allergies, Past Medical History, Past Surgical History, Family History and Social History were reviewed in [name] record.   Physical Exam: General: Well developed, well nourished, no acute distress Head: Normocephalic and atraumatic Eyes:  sclerae anicteric, EOMI Ears: Normal auditory acuity Mouth: Not examined, mask on during Covid-19 pandemic Lungs: Clear throughout to auscultation Heart: Regular rate and rhythm; no murmurs, rubs or bruits Abdomen: Soft, non tender and non distended. No masses, hepatosplenomegaly or hernias noted. Normal Bowel sounds Rectal: deferred to colonoscopy Musculoskeletal: Symmetrical with no gross deformities  Pulses:  Normal pulses noted Extremities: No clubbing, cyanosis, edema or deformities noted Neurological: Alert oriented x 4, grossly nonfocal Psychological:  Alert and cooperative. Normal mood and affect   Assessment and Recommendations:  1.  Diarrhea felt secondary to Crestor.  Advised him to speak to his prescribing physician about alternatives for cholesterol management.   2.  CRC screening, average risk.  Schedule colonoscopy. The risks (including bleeding, perforation, infection, missed lesions, medication reactions and possible hospitalization or surgery if complications occur), benefits, and alternatives to colonoscopy with possible biopsy  and possible polypectomy were discussed with the patient and they consent to proceed.   3. GERD. Begin omeprazole 20 mg p.o. every morning.  Follow standard antireflux measures.

## 2020-06-29 NOTE — Patient Instructions (Signed)
You have been scheduled for a colonoscopy. Please follow written instructions given to you at your visit today.  Please pick up your prep supplies at the pharmacy within the next 1-3 days. If you use inhalers (even only as needed), please bring them with you on the day of your procedure.  Thank you for choosing me and [facility].  Malcolm T. Stark, Jr., MD., FACG  

## 2020-06-29 NOTE — Telephone Encounter (Addendum)
Left message for pt to call   ----- Message from Lewayne Bunting, MD sent at 06/25/2020 11:44 AM EDT ----- Minor CAD; check lipids and liver; if LDL elevated will increase crestor dose. Olga Millers

## 2020-07-01 NOTE — Telephone Encounter (Signed)
Spoke with pt, aware of results. Lab orders mailed to the pt  

## 2020-07-02 ENCOUNTER — Telehealth: Payer: Self-pay

## 2020-07-02 NOTE — Telephone Encounter (Signed)
   [facility] Pre-operative Risk Assessment    HEARTCARE STAFF: - Please ensure there is not already an duplicate clearance open for this procedure. - Under Visit Info/Reason for Call, type in Other and utilize the format Clearance MM/DD/YY or Clearance TBD. Do not use dashes or single digits.  Request for surgical clearance:  1. What type of surgery is being performed? LEFT UNI KNEE CONVERSION TO TOAL KNEE   2. When is this surgery scheduled? TBD   3. What type of clearance is required (medical clearance vs. Pharmacy clearance to hold med vs. Both)? MEDICAL  4. Are there any medications that need to be held prior to surgery and how long?NONE   5. Practice name and name of physician performing surgery? [facility]  Edmonia Lynch, MD ATTN: KELLY  6. What is the office phone number? [phone]   7.   What is the office fax number? [phone]  8.   Anesthesia type (None, local, MAC, general) ? SPINAL

## 2020-07-02 NOTE — Telephone Encounter (Signed)
° °  Primary Cardiologist: Olga Millers, MD  Chart reviewed as part of pre-operative protocol coverage. Given past medical history and time since last visit, based on ACC/AHA guidelines, Andre Turner would be at acceptable risk for the planned procedure without further cardiovascular testing.   His RCRI is a class I risk, 0.4% risk of major cardiac event.   I will route this recommendation to the requesting party via Epic fax function and remove from pre-op pool.  Please call with questions.  Thomasene Ripple. Terrill Wauters NP-C    07/02/2020, 10:39 AM [facility] [address] Office [phone] Fax [phone]

## 2020-07-04 ENCOUNTER — Inpatient Hospital Stay (HOSPITAL_COMMUNITY): Admission: RE | Admit: 2020-07-04 | Payer: No Typology Code available for payment source | Source: Ambulatory Visit

## 2020-07-06 ENCOUNTER — Other Ambulatory Visit: Payer: Self-pay | Admitting: Emergency Medicine

## 2020-07-06 DIAGNOSIS — R0602 Shortness of breath: Secondary | ICD-10-CM

## 2020-07-07 ENCOUNTER — Ambulatory Visit: Payer: No Typology Code available for payment source | Admitting: Emergency Medicine

## 2020-07-13 ENCOUNTER — Encounter (HOSPITAL_COMMUNITY): Payer: Self-pay

## 2020-07-13 ENCOUNTER — Emergency Department (HOSPITAL_COMMUNITY): Payer: No Typology Code available for payment source

## 2020-07-13 ENCOUNTER — Other Ambulatory Visit: Payer: Self-pay

## 2020-07-13 ENCOUNTER — Emergency Department (HOSPITAL_COMMUNITY)
Admission: EM | Admit: 2020-07-13 | Discharge: 2020-07-13 | Disposition: A | Discharge status: Home / Self Care | Payer: No Typology Code available for payment source | Attending: Emergency Medicine | Admitting: Emergency Medicine

## 2020-07-13 DIAGNOSIS — Z96652 Presence of left artificial knee joint: Secondary | ICD-10-CM | POA: Insufficient documentation

## 2020-07-13 DIAGNOSIS — Z20822 Contact with and (suspected) exposure to covid-19: Secondary | ICD-10-CM | POA: Diagnosis not present

## 2020-07-13 DIAGNOSIS — Z87891 Personal history of nicotine dependence: Secondary | ICD-10-CM | POA: Insufficient documentation

## 2020-07-13 DIAGNOSIS — I10 Essential (primary) hypertension: Secondary | ICD-10-CM | POA: Diagnosis not present

## 2020-07-13 DIAGNOSIS — J45909 Unspecified asthma, uncomplicated: Secondary | ICD-10-CM | POA: Insufficient documentation

## 2020-07-13 DIAGNOSIS — R059 Cough, unspecified: Secondary | ICD-10-CM

## 2020-07-13 DIAGNOSIS — Z91040 Latex allergy status: Secondary | ICD-10-CM | POA: Diagnosis not present

## 2020-07-13 DIAGNOSIS — Z79899 Other long term (current) drug therapy: Secondary | ICD-10-CM | POA: Insufficient documentation

## 2020-07-13 DIAGNOSIS — R2241 Localized swelling, mass and lump, right lower limb: Secondary | ICD-10-CM | POA: Insufficient documentation

## 2020-07-13 DIAGNOSIS — R0602 Shortness of breath: Secondary | ICD-10-CM | POA: Diagnosis present

## 2020-07-13 DIAGNOSIS — J40 Bronchitis, not specified as acute or chronic: Secondary | ICD-10-CM | POA: Insufficient documentation

## 2020-07-13 LAB — COMPREHENSIVE METABOLIC PANEL
ALT: 40 U/L (ref 0–44)
AST: 71 U/L — ABNORMAL HIGH (ref 15–41)
Albumin: 4.1 g/dL (ref 3.5–5.0)
Alkaline Phosphatase: 79 U/L (ref 38–126)
Anion gap: 9 (ref 5–15)
BUN: 20 mg/dL (ref 6–20)
CO2: 20 mmol/L — ABNORMAL LOW (ref 22–32)
Calcium: 8 mg/dL — ABNORMAL LOW (ref 8.9–10.3)
Chloride: 107 mmol/L (ref 98–111)
Creatinine, Ser: 1.34 mg/dL — ABNORMAL HIGH (ref 0.61–1.24)
GFR calc Af Amer: >60 mL/min (ref >60)
GFR calc non Af Amer: 57 mL/min — ABNORMAL LOW (ref >60)
Glucose, Bld: 111 mg/dL — ABNORMAL HIGH (ref 70–99)
Potassium: 4.9 mmol/L (ref 3.5–5.1)
Sodium: 136 mmol/L (ref 135–145)
Total Bilirubin: 0.5 mg/dL (ref 0.3–1.2)
Total Protein: 7.4 g/dL (ref 6.5–8.1)

## 2020-07-13 LAB — RESPIRATORY PANEL BY RT PCR (FLU A&B, COVID)
Influenza A by PCR: NEGATIVE
Influenza B by PCR: NEGATIVE
SARS Coronavirus 2 by RT PCR: NEGATIVE

## 2020-07-13 LAB — CBC
HCT: 36.8 % — ABNORMAL LOW (ref 39.0–52.0)
Hemoglobin: 12.7 g/dL — ABNORMAL LOW (ref 13.0–17.0)
MCH: 33.1 pg (ref 26.0–34.0)
MCHC: 34.5 g/dL (ref 30.0–36.0)
MCV: 95.8 fL (ref 80.0–100.0)
Platelets: 252 10*3/uL (ref 150–400)
RBC: 3.84 MIL/uL — ABNORMAL LOW (ref 4.22–5.81)
RDW: 13.1 % (ref 11.5–15.5)
WBC: 10.4 10*3/uL (ref 4.0–10.5)
nRBC: 0 % (ref 0.0–0.2)

## 2020-07-13 LAB — BRAIN NATRIURETIC PEPTIDE: B Natriuretic Peptide: 54.2 pg/mL (ref 0.0–100.0)

## 2020-07-13 LAB — D-DIMER, QUANTITATIVE: D-Dimer, Quant: 14.19 ug/mL-FEU — ABNORMAL HIGH (ref 0.00–0.50)

## 2020-07-13 MED ORDER — ALBUTEROL SULFATE HFA 108 (90 BASE) MCG/ACT IN AERS: INHALATION_SPRAY | RESPIRATORY_TRACT | 0 refills | Status: DC

## 2020-07-13 MED ORDER — ENOXAPARIN SODIUM 100 MG/ML ~~LOC~~ SOLN
100.0000 mg | Freq: Two times a day (BID) | SUBCUTANEOUS | Status: DC
  Administered 2020-07-13: 100 mg via SUBCUTANEOUS
  Filled 2020-07-13: qty 1

## 2020-07-13 MED ORDER — IOHEXOL 350 MG/ML SOLN
100.0000 mL | Freq: Once | INTRAVENOUS | Status: AC | PRN
  Administered 2020-07-13: 100 mL via INTRAVENOUS

## 2020-07-13 MED ORDER — PREDNISONE 50 MG PO TABS: 50.0000 mg | ORAL_TABLET | Freq: Every day | ORAL | 0 refills | Status: DC

## 2020-07-13 MED ORDER — PREDNISONE 20 MG PO TABS
60.0000 mg | ORAL_TABLET | Freq: Once | ORAL | Status: AC
  Administered 2020-07-13: 60 mg via ORAL
  Filled 2020-07-13: qty 3

## 2020-07-13 MED ORDER — ALBUTEROL SULFATE HFA 108 (90 BASE) MCG/ACT IN AERS
4.0000 | INHALATION_SPRAY | Freq: Once | RESPIRATORY_TRACT | Status: AC
  Administered 2020-07-13: 4 via RESPIRATORY_TRACT
  Filled 2020-07-13: qty 6.7

## 2020-07-13 MED ORDER — SODIUM CHLORIDE (PF) 0.9 % IJ SOLN
INTRAMUSCULAR | Status: AC
  Filled 2020-07-13: qty 50

## 2020-07-13 MED ORDER — BENZONATATE 100 MG PO CAPS: 100.0000 mg | ORAL_CAPSULE | Freq: Three times a day (TID) | ORAL | 0 refills | Status: DC

## 2020-07-13 MED ORDER — BENZONATATE 100 MG PO CAPS
200.0000 mg | ORAL_CAPSULE | Freq: Once | ORAL | Status: AC
  Administered 2020-07-13: 200 mg via ORAL
  Filled 2020-07-13: qty 2

## 2020-07-13 NOTE — ED Provider Notes (Signed)
[facility]-EMERGENCY DEPT Provider Note   CSN: [id number] Arrival date & time: 07/13/20  1842     History Chief Complaint  Patient presents with  . Shortness of Breath  . Cough  . Fever  . Leg Swelling    Andre Turner is a 59 y.o. male.  HPI   Patient presented to the ED for evaluation of shortness of breath.  Patient states he has a history of chronic leg swelling but has noticed increased swelling in the right leg compared to the left.  Patient has been coughing.  He has been feeling short of breath.  Activity worsens his symptoms.  He denies any fevers.  Patient does not have a history of DVT or PE.  He denies having history of CHF but states he does have trouble with chronic leg swelling.  He went to a [facility] the other day and had negative flu test and a negative Covid test.  They said his chest x-ray was clear and they were concerned about a blood clot.  Past Medical History:  Diagnosis Date  . Allergic rhinitis   . Anemia   . Arthritis    rhuematoid and osteo arthritis  . Chronic headache    migraines  . Diverticulitis   . GERD (gastroesophageal reflux disease)   . History of benign prostatic hypertrophy   . Hyperlipidemia   . Hypertension   . Restless leg   . Tricuspid regurgitation   . Unspecified asthma(493.90)     Patient Active Problem List   Diagnosis Date Noted  . Shortness of breath 05/21/2020  . Hypertension 07/13/2011  . Hyperlipidemia 07/13/2011  . Skin lesions, generalized 06/05/2011  . Erectile dysfunction 06/05/2011  . ALLERGIC RHINITIS 06/25/2007  . REACTIVE AIRWAY DISEASE 06/25/2007  . HEADACHE, CHRONIC 06/25/2007  . BENIGN PROSTATIC HYPERTROPHY, HX OF 06/25/2007    Past Surgical History:  Procedure Laterality Date  . INGUINAL HERNIA REPAIR    . KNEE ARTHROSCOPY    . PATELLA-FEMORAL ARTHROPLASTY Left 07/14/2016   Procedure: LEFT KNEE PATELLA-FEMORAL  REPLACEMENT ARTHROPLASTY;  Surgeon: Loreta Ave, MD;   Location: [facility];  Service: Orthopedics;  Laterality: Left;  . TENDON REPAIR    . TONSILLECTOMY         Family History  Problem Relation Age of Onset  . Prostate cancer Father   . Hypertension Father   . Heart failure Father   . Leukemia Father   . Lung cancer Mother   . Obesity Mother   . Colon cancer Maternal Grandmother   . Diabetes Other        FH- maternal side  . Prostate cancer Other        FH-maternal side    Social History   Tobacco Use  . Smoking status: Former Smoker    Packs/day: 3.00    Years: 1.00    Pack years: 3.00    Types: Cigarettes  . Smokeless tobacco: Never Used  . Tobacco comment: stopped at age 50  Vaping Use  . Vaping Use: Never used  Substance Use Topics  . Alcohol use: Yes    Comment: 2 drinks per day  . Drug use: No    Home Medications Prior to Admission medications   Medication Sig Start Date End Date Taking? Authorizing Provider  albuterol (PROAIR HFA) 108 (90 Base) MCG/ACT inhaler ProAir HFA 90 mcg/actuation aerosol inhaler  INHALE 1 TO 2 PUFFS EVERY 4 TO 6 HOURS AS NEEDED FOR WHEEZING 07/13/20  Linwood Dibbles, MD  benzonatate (TESSALON) 100 MG capsule Take 1 capsule (100 mg total) by mouth every 8 (eight) hours. 07/13/20   Linwood Dibbles, MD  diphenoxylate-atropine (LOMOTIL) 2.5-0.025 MG tablet Lomotil 2.5 mg-0.025 mg tablet  take 2 po qid prn diarrhea    [provider]  furosemide (LASIX) 20 MG tablet Take 1 tablet (20 mg total) by mouth every other day. 05/27/20   Lewayne Bunting, MD  lisinopril (ZESTRIL) 10 MG tablet Take 10 mg by mouth daily.    [provider]  Potassium 99 MG TABS Take 1 tablet by mouth every other day.    [provider]  predniSONE (DELTASONE) 50 MG tablet Take 1 tablet (50 mg total) by mouth daily. 07/13/20   Linwood Dibbles, MD  rosuvastatin (CRESTOR) 20 MG tablet Take 10 mg by mouth daily.     [provider]  zinc gluconate 50 MG tablet Take 50 mg by mouth daily.     [provider]    Allergies    Celebrex [celecoxib], Percocet [oxycodone-acetaminophen], Sulfa antibiotics, Sulfonamide derivatives, Tetracycline, and Latex  Review of Systems   Review of Systems  All other systems reviewed and are negative.   Physical Exam Updated Vital Signs BP (!) 177/91   Pulse (!) 102   Temp 98.8 Andre (37.1 C) (Oral)   Resp (!) 26   Ht 1.676 m (5\' 6" )   Wt 105.2 kg   SpO2 97%   BMI 37.45 kg/m   Physical Exam Vitals and nursing note reviewed.  Constitutional:      General: He is not in acute distress.    Appearance: He is well-developed.  HENT:     Head: Normocephalic and atraumatic.     Right Ear: External ear normal.     Left Ear: External ear normal.  Eyes:     General: No scleral icterus.       Right eye: No discharge.        Left eye: No discharge.     Conjunctiva/sclera: Conjunctivae normal.  Neck:     Trachea: No tracheal deviation.  Cardiovascular:     Rate and Rhythm: Normal rate and regular rhythm.  Pulmonary:     Effort: Pulmonary effort is normal. No respiratory distress.     Breath sounds: No stridor. Wheezing and rhonchi present. No rales.  Abdominal:     General: Bowel sounds are normal. There is no distension.     Palpations: Abdomen is soft.     Tenderness: There is no abdominal tenderness. There is no guarding or rebound.     Comments: Protuberant  Musculoskeletal:        General: No tenderness.     Cervical back: Neck supple.     Right lower leg: No tenderness. Edema present.     Left lower leg: No tenderness. Edema present.  Skin:    General: Skin is warm and dry.     Findings: No rash.  Neurological:     Mental Status: He is alert.     Cranial Nerves: No cranial nerve deficit (no facial droop, extraocular movements intact, no slurred speech).     Sensory: No sensory deficit.     Motor: No abnormal muscle tone or seizure activity.     Coordination: Coordination normal.     ED Results / Procedures /  Treatments   Labs (all labs ordered are listed, but only abnormal results are displayed) Labs Reviewed  CBC - Abnormal; Notable for the following  components:      Result Value   RBC 3.84 (*)    Hemoglobin 12.7 (*)    HCT 36.8 (*)    All other components within normal limits  D-DIMER, QUANTITATIVE (NOT AT [facility]) - Abnormal; Notable for the following components:   D-Dimer, Quant 14.19 (*)    All other components within normal limits  COMPREHENSIVE METABOLIC PANEL - Abnormal; Notable for the following components:   CO2 20 (*)    Glucose, Bld 111 (*)    Creatinine, Ser 1.34 (*)    Calcium 8.0 (*)    AST 71 (*)    GFR calc non Af Amer 57 (*)    All other components within normal limits  RESPIRATORY PANEL BY RT PCR (FLU A&B, COVID)  BRAIN NATRIURETIC PEPTIDE  ETHANOL    EKG EKG Interpretation  Date/Time:  Monday July 13 2020 19:43:15 EDT Ventricular Rate:  94 PR Interval:    QRS Duration: 82 QT Interval:  338 QTC Calculation: 423 R Axis:   81 Text Interpretation: Sinus rhythm Borderline right axis deviation No old tracing to compare Confirmed by Linwood Dibbles [phone]) on 07/13/2020 7:49:03 PM   Radiology CT Angio Chest PE W and/or Wo Contrast  Result Date: 07/13/2020 CLINICAL DATA:  Fever for several days with shortness of breath EXAM: CT ANGIOGRAPHY CHEST WITH CONTRAST TECHNIQUE: Multidetector CT imaging of the chest was performed using the standard protocol during bolus administration of intravenous contrast. Multiplanar CT image reconstructions and MIPs were obtained to evaluate the vascular anatomy. CONTRAST:  OMNIPAQUE IOHEXOL 350 MG/ML SOLN COMPARISON:  Chest x-ray from earlier in the same day. FINDINGS: Cardiovascular: Thoracic aorta and its branches are within normal limits. No aneurysmal dilatation or dissection is seen. No cardiac enlargement is noted. The pulmonary artery shows a normal branching pattern. No definitive filling defect to suggest pulmonary embolism is  seen. Some patient motion artifact is noted. Mediastinum/Nodes: Thoracic inlet is within normal limits. No sizable hilar or mediastinal adenopathy is noted. The esophagus as visualized is within normal limits. Lungs/Pleura: Lungs are well aerated bilaterally. No focal infiltrate or sizable effusion is seen. Minimal dependent atelectatic changes are seen. Upper Abdomen: Visualized upper abdomen is within normal limits. Musculoskeletal: No acute bony abnormality is noted. Review of the MIP images confirms the above findings. IMPRESSION: No evidence of pulmonary emboli. Mild dependent atelectatic changes. Electronically Signed   By: Alcide Clever M.D.   On: 07/13/2020 22:27   DG Chest Port 1 View  Result Date: 07/13/2020 CLINICAL DATA:  Initial evaluation for acute dyspnea, fever. EXAM: PORTABLE CHEST 1 VIEW COMPARISON:  Prior radiograph from 10/09/2016. FINDINGS: Exaggeration of the cardiac silhouette related AP technique. Mediastinal silhouette normal. Lungs hypoinflated. Minimal subsegmental atelectasis at the left lung base. No focal infiltrates. No edema or effusion. No pneumothorax. No acute osseous finding. IMPRESSION: 1. Shallow lung inflation with minimal left basilar subsegmental atelectasis. 2. No other active cardiopulmonary disease. Electronically Signed   By: Rise Mu M.D.   On: 07/13/2020 20:54    Procedures Procedures (including critical care time)  Medications Ordered in ED Medications  sodium chloride (PF) 0.9 % injection (has no administration in time range)  benzonatate (TESSALON) capsule 200 mg (has no administration in time range)  enoxaparin (LOVENOX) injection 100 mg (has no administration in time range)  albuterol (VENTOLIN HFA) 108 (90 Base) MCG/ACT inhaler 4 puff (4 puffs Inhalation Given 07/13/20 2051)  iohexol (OMNIPAQUE) 350 MG/ML injection 100 mL (100 mLs Intravenous Contrast Given  07/13/20 2219)    ED Course  I have reviewed the triage vital signs and the  nursing notes.  Pertinent labs & imaging results that were available during my care of the patient were reviewed by me and considered in my medical decision making (see chart for details).  Clinical Course as of Jul 14 2303  [facility] Jul 13, 2020  2114 Chest x-ray without acute findings   [JK]  2240 D dimer significantly elevated.  CT ordered   [JK]  2241 Lovenox ordered sq   [JK]    Clinical Course User Index [JK] Linwood Dibbles, MD   MDM Rules/Calculators/A&P                          Patient presents to the ED with complaints of persistent cough.  Patient had normal oxygenation and was breathing easily in the ED.  On exam he did have some wheezing.  Patient did have notable leg swelling with asymmetry, right greater than left.  D-dimer is significantly elevated.  CT scan was performed and there is no evidence of PE.  I am still concerned about the possibility of DVT however.  The technician is not available at this time at night so I will give the patient a dose of Lovenox and have him follow-up in the morning to have his Doppler study.  His cough does seem to be related to pneumonia or PE and we will treat him for bronchitis.  Discussed findings and plan with patient and his sister. Final Clinical Impression(s) / ED Diagnoses Final diagnoses:  Cough  Bronchitis    Rx / DC Orders ED Discharge Orders         Ordered    predniSONE (DELTASONE) 50 MG tablet  Daily        07/13/20 2302    LE VENOUS        07/13/20 2302    albuterol (PROAIR HFA) 108 (90 Base) MCG/ACT inhaler        07/13/20 2302    benzonatate (TESSALON) 100 MG capsule  Every 8 hours        07/13/20 2302           Linwood Dibbles, MD 07/13/20 2305

## 2020-07-13 NOTE — Discharge Instructions (Signed)
Take the medications as prescribed for your cough.  Follow-up tomorrow to have the ultrasound test to check on your leg.

## 2020-07-13 NOTE — ED Triage Notes (Addendum)
Patient states that he had a fever 4 days ago then he developed SOB and cough. Patient went to [facility] and states that he had a CXR, and a negative flu test and a negative Covid test.  Patient c/o chest wall pain when he coughs  Patient added that he has swelling to both legs R>L

## 2020-07-14 ENCOUNTER — Emergency Department (HOSPITAL_BASED_OUTPATIENT_CLINIC_OR_DEPARTMENT_OTHER)
Admission: RE | Admit: 2020-07-14 | Discharge: 2020-07-14 | Disposition: A | Discharge status: Home / Self Care | Payer: No Typology Code available for payment source | Source: Ambulatory Visit | Attending: Emergency Medicine | Admitting: Emergency Medicine

## 2020-07-14 DIAGNOSIS — R609 Edema, unspecified: Secondary | ICD-10-CM

## 2020-07-14 DIAGNOSIS — R0602 Shortness of breath: Secondary | ICD-10-CM | POA: Diagnosis not present

## 2020-07-14 DIAGNOSIS — M7989 Other specified soft tissue disorders: Secondary | ICD-10-CM

## 2020-07-14 DIAGNOSIS — R52 Pain, unspecified: Secondary | ICD-10-CM | POA: Diagnosis not present

## 2020-07-14 NOTE — Progress Notes (Signed)
Bilateral lower extremity venous duplex complete.  Please see CV proc tab for preliminary results. Levin Bacon- RDMS, RVT 12:20 PM  07/14/2020

## 2020-07-15 NOTE — Telephone Encounter (Signed)
Dr. Delton Coombes, please see mychart message and advise.

## 2020-07-17 NOTE — Telephone Encounter (Signed)
I reviewed his chest CT and agree that it looks okay.  It does not look like his pulmonary function testing has been done.  I think he still needs this.  It is okay with me to try to work him in earlier with APP if we have an opening.

## 2020-08-07 ENCOUNTER — Other Ambulatory Visit (HOSPITAL_COMMUNITY)
Admission: RE | Admit: 2020-08-07 | Discharge: 2020-08-07 | Disposition: A | Discharge status: Home / Self Care | Payer: No Typology Code available for payment source | Source: Ambulatory Visit | Attending: Emergency Medicine | Admitting: Emergency Medicine

## 2020-08-07 DIAGNOSIS — Z01812 Encounter for preprocedural laboratory examination: Secondary | ICD-10-CM | POA: Diagnosis present

## 2020-08-07 DIAGNOSIS — Z20822 Contact with and (suspected) exposure to covid-19: Secondary | ICD-10-CM | POA: Insufficient documentation

## 2020-08-08 LAB — SARS CORONAVIRUS 2 (TAT 6-24 HRS): SARS Coronavirus 2: NEGATIVE

## 2020-08-10 ENCOUNTER — Ambulatory Visit: Payer: No Typology Code available for payment source | Admitting: Emergency Medicine

## 2020-08-11 ENCOUNTER — Other Ambulatory Visit (HOSPITAL_COMMUNITY)
Admission: RE | Admit: 2020-08-11 | Discharge: 2020-08-11 | Disposition: A | Discharge status: Home / Self Care | Payer: No Typology Code available for payment source | Source: Ambulatory Visit | Attending: Emergency Medicine | Admitting: Emergency Medicine

## 2020-08-11 DIAGNOSIS — Z20822 Contact with and (suspected) exposure to covid-19: Secondary | ICD-10-CM | POA: Insufficient documentation

## 2020-08-11 DIAGNOSIS — Z01812 Encounter for preprocedural laboratory examination: Secondary | ICD-10-CM | POA: Insufficient documentation

## 2020-08-11 LAB — SARS CORONAVIRUS 2 (TAT 6-24 HRS): SARS Coronavirus 2: NEGATIVE

## 2020-08-14 ENCOUNTER — Ambulatory Visit (INDEPENDENT_AMBULATORY_CARE_PROVIDER_SITE_OTHER): Payer: No Typology Code available for payment source | Admitting: Emergency Medicine

## 2020-08-14 ENCOUNTER — Other Ambulatory Visit: Payer: Self-pay

## 2020-08-14 ENCOUNTER — Encounter: Payer: Self-pay | Admitting: Primary Care

## 2020-08-14 ENCOUNTER — Ambulatory Visit (INDEPENDENT_AMBULATORY_CARE_PROVIDER_SITE_OTHER): Payer: No Typology Code available for payment source | Admitting: Primary Care

## 2020-08-14 VITALS — BP 134/78 | HR 87 | Ht 67.0 in | Wt 239.0 lb

## 2020-08-14 DIAGNOSIS — J984 Other disorders of lung: Secondary | ICD-10-CM

## 2020-08-14 DIAGNOSIS — R0602 Shortness of breath: Secondary | ICD-10-CM

## 2020-08-14 LAB — PULMONARY FUNCTION TEST
DL/VA % pred: 113 %
DL/VA: 4.87 ml/min/mmHg/L
DLCO cor % pred: 100 %
DLCO cor: 25.31 ml/min/mmHg
DLCO unc % pred: 94 %
DLCO unc: 23.84 ml/min/mmHg
FEF 25-75 Post: 3.01 L/sec
FEF 25-75 Pre: 2.84 L/sec
FEF2575-%Change-Post: 6 %
FEF2575-%Pred-Post: 111 %
FEF2575-%Pred-Pre: 105 %
FEV1-%Change-Post: 2 %
FEV1-%Pred-Post: 83 %
FEV1-%Pred-Pre: 80 %
FEV1-Post: 2.69 L
FEV1-Pre: 2.62 L
FEV1FVC-%Change-Post: 3 %
FEV1FVC-%Pred-Pre: 111 %
FEV6-%Change-Post: 0 %
FEV6-%Pred-Post: 75 %
FEV6-%Pred-Pre: 75 %
FEV6-Post: 3.08 L
FEV6-Pre: 3.08 L
FEV6FVC-%Pred-Post: 105 %
FEV6FVC-%Pred-Pre: 105 %
FVC-%Change-Post: -1 %
FVC-%Pred-Post: 71 %
FVC-%Pred-Pre: 72 %
FVC-Post: 3.08 L
FVC-Pre: 3.11 L
Post FEV1/FVC ratio: 87 %
Post FEV6/FVC ratio: 100 %
Pre FEV1/FVC ratio: 84 %
Pre FEV6/FVC Ratio: 100 %
RV % pred: 97 %
RV: 2.02 L
TLC % pred: 82 %
TLC: 5.29 L

## 2020-08-14 LAB — NITRIC OXIDE: Nitric Oxide: 12

## 2020-08-14 MED ORDER — BREO ELLIPTA 100-25 MCG/INH IN AEPB: 1.0000 | INHALATION_SPRAY | Freq: Every day | RESPIRATORY_TRACT | 0 refills | Status: DC

## 2020-08-14 NOTE — Patient Instructions (Addendum)
Pulmonary functio testing showed moderate restriction, this can be due to weight or underlying asthma. Recommend therapeutic trial steroid inhaler called BREO, take one puff once daily. If breathing improves on this we will continue it. If no improvement after sample ok to stop   Orders: FENO re: cough Incentive spirometer   Recommendations: Start claritin 10mg  daily Take mucinex 600mg  twice daily for chest congestion  Trial Breo 100- one puff daily in the morning (rinse mouth after use) Use Incentive spirometer 5-10 breaths/hour twice daily  Follow-up: 2-4 weeks televisit with NP

## 2020-08-14 NOTE — Progress Notes (Signed)
@Patient  ID: , male    DOB: 11-24-1960, 59 y.o.   MRN: [id number]  Chief Complaint  Patient presents with  . Follow-up    PFT performed today. Pt states he has been doing okay since last visit and denies any real complaints.    Referring provider: [id number], PA-C  HPI: 59 year old male, former smoker quit 1970 (2 pack year hx).  Past medical history significant for hypertension, allergic rhinitis, BPH. Patient of Dr. 67, seen for initial consult on 05/21/2020 for shortness of breath.  Shortness of breath felt to be factorial due to weight gain.  08/14/2020 Patient presents today for follow-up with PFTS. Breathing has improved some since last visit. Shortness of breath varies, some days are better than others. Reports morning chest congestion with wheezing. He was seen in [facility] ED on 07/13/20 for shortness of breath, cough and leg swelling. Treated for bronchitis with prednisone, proair and tesslon perles. Covid negative, CXR clear. CTA negative for PE. LE venous dopplers negative. Chronic leg swelling has recently improved. Denies cough, chest tightness.    Pulmonary testing: 08/14/2020 PFTs- FVC 3.08 (71%), FEV1 2.69 (83%), ratio 87, TLC 82%, DLCOcor 25.31 (100%)  Moderate restriction without reversibility. Normal diffusion capacity.   08/14/2020 FENO- 12    Imaging: 07/13/20- Lungs/Pleura: Lungs are well aerated bilaterally. No focal infiltrate or sizable effusion is seen. Minimal dependent atelectatic changes are seen.  Allergies  Allergen Reactions  . Celebrex [Celecoxib] Itching  . Percocet [Oxycodone-Acetaminophen]   . Sulfa Antibiotics Nausea And Vomiting and Itching  . Sulfonamide Derivatives Hives    REACTION: causes nausea , itchy  . Tetracycline Nausea Only  . Latex Rash    Immunization History  Administered Date(s) Administered  . Tdap 02/19/2007    Past Medical History:  Diagnosis Date  . Allergic rhinitis   . Anemia   . Arthritis     rhuematoid and osteo arthritis  . Chronic headache    migraines  . Diverticulitis   . GERD (gastroesophageal reflux disease)   . History of benign prostatic hypertrophy   . Hyperlipidemia   . Hypertension   . Restless leg   . Tricuspid regurgitation   . Unspecified asthma(493.90)     Tobacco History: Social History   Tobacco Use  Smoking Status Former Smoker  . Packs/day: 3.00  . Years: 1.00  . Pack years: 3.00  . Types: Cigarettes  Smokeless Tobacco Never Used  Tobacco Comment   stopped at age 45   Counseling given: Not Answered Comment: stopped at age 42   Outpatient Medications Prior to Visit  Medication Sig Dispense Refill  . albuterol (PROAIR HFA) 108 (90 Base) MCG/ACT inhaler ProAir HFA 90 mcg/actuation aerosol inhaler  INHALE 1 TO 2 PUFFS EVERY 4 TO 6 HOURS AS NEEDED FOR WHEEZING 8 g 0  . B Complex-C (B-COMPLEX WITH VITAMIN C) tablet Take 1 tablet by mouth daily.    . diphenoxylate-atropine (LOMOTIL) 2.5-0.025 MG tablet Lomotil 2.5 mg-0.025 mg tablet  take 2 po qid prn diarrhea    . furosemide (LASIX) 20 MG tablet Take 1 tablet (20 mg total) by mouth every other day. 45 tablet 3  . lisinopril (ZESTRIL) 10 MG tablet Take 10 mg by mouth daily.    . Multiple Minerals-Vitamins (CALCIUM-MAGNESIUM-ZINC-D3 PO) Take 1 tablet by mouth daily.    . Potassium 99 MG TABS Take 1 tablet by mouth every other day.    . rosuvastatin (CRESTOR) 20 MG tablet Take 10 mg by mouth  daily.     . Specialty Vitamins Products (PROSTATE PO) Take 1 tablet by mouth daily.    [name] UNABLE TO FIND Take 2 tablets by mouth daily. Med Name: [name]    . benzonatate (TESSALON) 100 MG capsule Take 1 capsule (100 mg total) by mouth every 8 (eight) hours. 21 capsule 0  . predniSONE (DELTASONE) 50 MG tablet Take 1 tablet (50 mg total) by mouth daily. 5 tablet 0  . zinc gluconate 50 MG tablet Take 50 mg by mouth daily.     No facility-administered medications prior to visit.    Review of  Systems  Review of Systems  Constitutional: Negative.   HENT: Positive for congestion.   Respiratory: Positive for wheezing. Negative for cough and shortness of breath.   Cardiovascular: Negative.    Physical Exam  BP 134/78 (BP Location: Right Arm, Cuff Size: Large)   Pulse 87   Ht 5\' 7"  (1.702 m)   Wt 239 lb (108.4 kg)   SpO2 96%   BMI 37.43 kg/m  Physical Exam Constitutional:      General: He is not in acute distress.    Appearance: Normal appearance. He is obese. He is not ill-appearing.  Cardiovascular:     Rate and Rhythm: Normal rate and regular rhythm.  Pulmonary:     Effort: Pulmonary effort is normal. No respiratory distress.     Breath sounds: Normal breath sounds. No stridor. No wheezing or rales.  Neurological:     Mental Status: He is alert.      Lab Results:  CBC    Component Value Date/Time   WBC 10.4 07/13/2020 2050   RBC 3.84 (L) 07/13/2020 2050   HGB 12.7 (L) 07/13/2020 2050   HCT 36.8 (L) 07/13/2020 2050   PLT 252 07/13/2020 2050   MCV 95.8 07/13/2020 2050   MCV 97.2 (A) 12/12/2013 1923   MCH 33.1 07/13/2020 2050   MCHC 34.5 07/13/2020 2050   RDW 13.1 07/13/2020 2050   LYMPHSABS 2.8 07/13/2011 0941   MONOABS 0.6 07/13/2011 0941   EOSABS 0.1 07/13/2011 0941   BASOSABS 0.1 07/13/2011 0941    BMET    Component Value Date/Time   NA 136 07/13/2020 2050   NA 135 05/27/2020 0906   K 4.9 07/13/2020 2050   CL 107 07/13/2020 2050   CO2 20 (L) 07/13/2020 2050   GLUCOSE 111 (H) 07/13/2020 2050   BUN 20 07/13/2020 2050   BUN 19 05/27/2020 0906   CREATININE 1.34 (H) 07/13/2020 2050   CALCIUM 8.0 (L) 07/13/2020 2050   GFRNONAA 57 (L) 07/13/2020 2050   GFRAA >60 07/13/2020 2050    BNP    Component Value Date/Time   BNP 54.2 07/13/2020 2050    ProBNP    Component Value Date/Time   PROBNP 21 05/27/2020 0906    Imaging: No results found.   Assessment & Plan:   Restrictive lung disease - Pulmonary function testing today showed  moderate restriction without BD response and normal diffusion capacity.  - FVC 3.08 (71%), FEV1 2.69 (83%), ratio 87, DLCOcor 100%. FENO was 12. - Restriction likely d/t body habitus, however, it is possible he has some mild underlying asthma. Patient reports morning congestion and wheezing. We will do a therapeutic trial Breo Ellipta 100. If patient notices improvement in breathing with ICS/LABA ok to continue, if not then will discontinue and encourage weight loss efforts.    05/29/2020, NP 08/17/2020

## 2020-08-14 NOTE — Progress Notes (Signed)
PFT done today. 

## 2020-08-17 DIAGNOSIS — J984 Other disorders of lung: Secondary | ICD-10-CM | POA: Insufficient documentation

## 2020-08-17 NOTE — Assessment & Plan Note (Addendum)
-   Pulmonary function testing today showed moderate restriction without BD response and normal diffusion capacity.  - FVC 3.08 (71%), FEV1 2.69 (83%), ratio 87, DLCOcor 100%. FENO was 12. - Restriction likely d/t body habitus, however, it is possible he has some mild underlying asthma. Patient reports morning congestion and wheezing. We will do a therapeutic trial Breo Ellipta 100. If patient notices improvement in breathing with ICS/LABA ok to continue, if not then will discontinue and encourage weight loss efforts.

## 2020-08-19 ENCOUNTER — Other Ambulatory Visit: Payer: Self-pay | Admitting: Gastroenterology

## 2020-08-19 ENCOUNTER — Telehealth: Payer: Self-pay | Admitting: Gastroenterology

## 2020-08-19 ENCOUNTER — Ambulatory Visit (INDEPENDENT_AMBULATORY_CARE_PROVIDER_SITE_OTHER): Payer: No Typology Code available for payment source

## 2020-08-19 DIAGNOSIS — Z1159 Encounter for screening for other viral diseases: Secondary | ICD-10-CM

## 2020-08-19 LAB — SARS CORONAVIRUS 2 (TAT 6-24 HRS): SARS Coronavirus 2: NEGATIVE

## 2020-08-19 MED ORDER — NA SULFATE-K SULFATE-MG SULF 17.5-3.13-1.6 GM/177ML PO SOLN: 1.0000 | Freq: Once | ORAL | 0 refills | Status: AC

## 2020-08-19 NOTE — Telephone Encounter (Signed)
Prescription sent to patient's pharmacy.

## 2020-08-19 NOTE — Telephone Encounter (Signed)
Pt called stating that his pharmacy did not receive prep. His procedure is 08/20/20. Pls send to [facility] on [address] in [place].

## 2020-08-21 ENCOUNTER — Other Ambulatory Visit: Payer: Self-pay

## 2020-08-21 ENCOUNTER — Ambulatory Visit (AMBULATORY_SURGERY_CENTER): Payer: No Typology Code available for payment source | Admitting: Gastroenterology

## 2020-08-21 ENCOUNTER — Encounter: Payer: Self-pay | Admitting: Gastroenterology

## 2020-08-21 VITALS — BP 123/75 | HR 74 | Temp 97.3°F | Resp 20 | Ht 67.0 in | Wt 239.0 lb

## 2020-08-21 DIAGNOSIS — Z1212 Encounter for screening for malignant neoplasm of rectum: Secondary | ICD-10-CM

## 2020-08-21 DIAGNOSIS — Z1211 Encounter for screening for malignant neoplasm of colon: Secondary | ICD-10-CM

## 2020-08-21 MED ORDER — SODIUM CHLORIDE 0.9 % IV SOLN: 500.0000 mL | Freq: Once | INTRAVENOUS | Status: DC

## 2020-08-21 NOTE — Progress Notes (Signed)
PT taken to PACU. Monitors in place. VSS. Report given to RN. 

## 2020-08-21 NOTE — Op Note (Signed)
[facility] Patient Name: Andre Turner Procedure Date: 08/21/2020 1:34 PM MRN: [id number] Endoscopist: Meryl Dare , MD Age: 59 Referring MD:  Date of Birth: 07-Oct-1961 Gender: Male Account #: [id number] Procedure:                Colonoscopy Indications:              Screening for colorectal malignant neoplasm Medicines:                Monitored Anesthesia Care Procedure:                Pre-Anesthesia Assessment:                           - Prior to the procedure, a History and Physical                            was performed, and patient medications and                            allergies were reviewed. The patient's tolerance of                            previous anesthesia was also reviewed. The risks                            and benefits of the procedure and the sedation                            options and risks were discussed with the patient.                            All questions were answered, and informed consent                            was obtained. Prior Anticoagulants: The patient has                            taken no previous anticoagulant or antiplatelet                            agents. ASA Grade Assessment: II - A patient with                            mild systemic disease. After reviewing the risks                            and benefits, the patient was deemed in                            satisfactory condition to undergo the procedure.                           After obtaining informed consent, the colonoscope  was passed under direct vision. Throughout the                            procedure, the patient's blood pressure, pulse, and                            oxygen saturations were monitored continuously. The                            Colonoscope was introduced through the anus and                            advanced to the the cecum, identified by                            appendiceal orifice  and ileocecal valve. The                            ileocecal valve, appendiceal orifice, and rectum                            were photographed. The quality of the bowel                            preparation was good. The colonoscopy was performed                            without difficulty. The patient tolerated the                            procedure well. Scope In: 1:47:12 PM Scope Out: 2:02:45 PM Scope Withdrawal Time: 0 hours 13 minutes 14 seconds  Total Procedure Duration: 0 hours 15 minutes 33 seconds  Findings:                 The perianal and digital rectal examinations were                            normal.                           The entire examined colon appeared normal on direct                            and retroflexion views. Complications:            No immediate complications. Estimated blood loss:                            None. Estimated Blood Loss:     Estimated blood loss: none. Impression:               - The entire examined colon is normal on direct and                            retroflexion views.                           -  No specimens collected. Recommendation:           - Repeat colonoscopy in 10 years for screening                            purposes.                           - Patient has a contact number available for                            emergencies. The signs and symptoms of potential                            delayed complications were discussed with the                            patient. Return to normal activities tomorrow.                            Written discharge instructions were provided to the                            patient.                           - Resume previous diet.                           - Continue present medications. Meryl Dare, MD 08/21/2020 2:05:41 PM This report has been signed electronically.

## 2020-08-21 NOTE — Progress Notes (Signed)
Cw vitlas and SH IV.

## 2020-08-21 NOTE — Patient Instructions (Signed)
Your colonoscopy was normal.  You don't need another one for 10 years!  YOU HAD AN ENDOSCOPIC PROCEDURE TODAY AT THE [facility]:   Refer to the procedure report that was given to you for any specific questions about what was found during the examination.  If the procedure report does not answer your questions, please call your gastroenterologist to clarify.  If you requested that your care partner not be given the details of your procedure findings, then the procedure report has been included in a sealed envelope for you to review at your convenience later.  YOU SHOULD EXPECT: Some feelings of bloating in the abdomen. Passage of more gas than usual.  Walking can help get rid of the air that was put into your GI tract during the procedure and reduce the bloating. If you had a lower endoscopy (such as a colonoscopy or flexible sigmoidoscopy) you may notice spotting of blood in your stool or on the toilet paper. If you underwent a bowel prep for your procedure, you may not have a normal bowel movement for a few days.  Please Note:  You might notice some irritation and congestion in your nose or some drainage.  This is from the oxygen used during your procedure.  There is no need for concern and it should clear up in a day or so.  SYMPTOMS TO REPORT IMMEDIATELY:   Following lower endoscopy (colonoscopy or flexible sigmoidoscopy):  Excessive amounts of blood in the stool  Significant tenderness or worsening of abdominal pains  Swelling of the abdomen that is new, acute  Fever of 100F or higher  For urgent or emergent issues, a gastroenterologist can be reached at any hour by calling [phone]. Do not use MyChart messaging for urgent concerns.    DIET:  We do recommend a small meal at first, but then you may proceed to your regular diet.  Drink plenty of fluids but you should avoid alcoholic beverages for 24 hours.  ACTIVITY:  You should plan to take it easy for the rest of today  and you should NOT DRIVE or use heavy machinery until tomorrow (because of the sedation medicines used during the test).    FOLLOW UP: Our staff will call the number listed on your records 48-72 hours following your procedure to check on you and address any questions or concerns that you may have regarding the information given to you following your procedure. If we do not reach you, we will leave a message.  We will attempt to reach you two times.  During this call, we will ask if you have developed any symptoms of COVID 19. If you develop any symptoms (ie: fever, flu-like symptoms, shortness of breath, cough etc.) before then, please call [phone].  If you test positive for Covid 19 in the 2 weeks post procedure, please call and report this information to us.    If any biopsies were taken you will be contacted by phone or by letter within the next 1-3 weeks.  Please call us at [phone] if you have not heard about the biopsies in 3 weeks.    SIGNATURES/CONFIDENTIALITY: You and/or your care partner have signed paperwork which will be entered into your electronic medical record.  These signatures attest to the fact that that the information above on your After Visit Summary has been reviewed and is understood.  Full responsibility of the confidentiality of this discharge information lies with you and/or your care-partner. 

## 2020-08-25 ENCOUNTER — Telehealth: Payer: Self-pay | Admitting: *Deleted

## 2020-08-25 ENCOUNTER — Telehealth: Payer: Self-pay

## 2020-08-25 NOTE — Telephone Encounter (Signed)
No answer, left message to call if having any issues or concerns, B.Tsugio Elison RN 

## 2020-08-25 NOTE — Telephone Encounter (Signed)
First follow up call attempt.  LVM. 

## 2020-09-09 ENCOUNTER — Other Ambulatory Visit: Payer: Self-pay

## 2020-09-09 ENCOUNTER — Telehealth: Payer: Self-pay | Admitting: Primary Care

## 2020-09-09 ENCOUNTER — Encounter: Payer: No Typology Code available for payment source | Admitting: Primary Care

## 2020-09-09 NOTE — Progress Notes (Signed)
 Err

## 2020-09-09 NOTE — Telephone Encounter (Signed)
Patient contacted today for televisit which was scheduled for 2pm. Patient was at work and unable to speak. Told me that he cancelled apt yesterday. I will go ahead an cancel today's apt. Instructed patient to call back and reschedule at this convenience.

## 2020-09-17 ENCOUNTER — Telehealth: Payer: Self-pay | Admitting: Primary Care

## 2020-09-17 MED ORDER — BREO ELLIPTA 100-25 MCG/INH IN AEPB
1.0000 | INHALATION_SPRAY | Freq: Every day | RESPIRATORY_TRACT | 5 refills | Status: DC
Start: 2020-09-17 — End: 2020-09-17

## 2020-09-17 MED ORDER — BREO ELLIPTA 100-25 MCG/INH IN AEPB
1.0000 | INHALATION_SPRAY | Freq: Every day | RESPIRATORY_TRACT | 5 refills | Status: DC
Start: 2020-09-17 — End: 2021-01-04

## 2020-09-17 NOTE — Telephone Encounter (Signed)
Called and spoke with pt who states he has been able to tell a difference after beginning Breo and would like to have this sent to pharmacy. Rx has been sent to preferred pharmacy for pt. Nothing further needed.

## 2020-09-23 ENCOUNTER — Ambulatory Visit: Payer: No Typology Code available for payment source | Admitting: Primary Care

## 2020-10-14 ENCOUNTER — Telehealth: Payer: Self-pay | Admitting: Emergency Medicine

## 2020-10-14 NOTE — Telephone Encounter (Signed)
Pt. Does need to get Covid Tested. Patient's who have been vaccinated have symptoms similar to a cold. OK to make a tele visit for tomorrow .

## 2020-10-14 NOTE — Telephone Encounter (Signed)
Spoke with pt, states he has a 2 month rov tomorrow and is unsure if he should come in to office or if he should switch to a televisit. Per 12/1 televisit notes it appears that this OV is a follow-up to see how he's doing on his Breo.  Pt states he is doing well, but does note that his inhaler is preferred by his insurance and still has a copay of over $300/month.  Pt is interested in seeking patient assistance to get this more affordable.  Pt has s/s of low grade temp (99-100), runny nose, pnd, mostly nonprod cough with small amount of clear thin mucus.  S/s present X2-3 days.  Pt has been taking Breo daily and has not felt SOB enough to warrant his albuterol inhaler.   Pt is not covid vaccinated.  Pt wonders if he should get covid test, and if we have any additional recommendations for him.    Sending to SG since RB worked last night and is unavailable.  Please advise.  Thanks!

## 2020-10-14 NOTE — Telephone Encounter (Signed)
Spoke with pt, aware of recs.  Changed appt to televisit tomorrow.  Advised pt to get covid test, and to call us back with results.   Also sent Breo patient assistance application to pt in mail.  Nothing further needed at this time- will close encounter.

## 2020-10-15 ENCOUNTER — Encounter: Payer: Self-pay | Admitting: Emergency Medicine

## 2020-10-15 ENCOUNTER — Other Ambulatory Visit: Payer: Self-pay

## 2020-10-15 ENCOUNTER — Ambulatory Visit (INDEPENDENT_AMBULATORY_CARE_PROVIDER_SITE_OTHER): Payer: No Typology Code available for payment source | Admitting: Emergency Medicine

## 2020-10-15 DIAGNOSIS — J069 Acute upper respiratory infection, unspecified: Secondary | ICD-10-CM | POA: Diagnosis not present

## 2020-10-15 DIAGNOSIS — R0602 Shortness of breath: Secondary | ICD-10-CM

## 2020-10-15 NOTE — Progress Notes (Signed)
Virtual Visit via Telephone Note  I connected with Andre Turner on 10/15/20 at 11:15 AM EST by telephone and verified that I am speaking with the correct person using two identifiers.  Location: Patient: Home Provider: Office   I discussed the limitations, risks, security and privacy concerns of performing an evaluation and management service by telephone and the availability of in person appointments. I also discussed with the patient that there may be a patient responsible charge related to this service. The patient expressed understanding and agreed to proceed.  History of Present Illness: 60 yo former minimal minimal smoker w HTN, RA, GERD, rhinitis, GERD.  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  He was tried on empiric Breo to see if he would get benefit.  He had COVID in March 2021.    Observations/Objective: Pulmonary function testing 08/14/2020 reviewed by me, showed mild restriction, possible mixed disease.  Lung volumes were normal.  Diffusion capacity normal.  No significant bronchodilator response He reports that the Breo did help his breathing: he gets less exertional SOB at work. He uses albuterol about 1-2x a week.  The Virgel Bouquet is a preferred medication on his insurance but he still has a high co-pay of $300.  We are working on getting financial assistance. He has been experiencing some increased nasal gtt, URI sx, then developed some low grade fever, chills. Increasing cough. He is on lisinopril. He has an appointment to have covid testing tomorrow.  He is not vaccinated, was planning to do so 6 months after his infection   Assessment and Plan: Shortness of breath Disease on his pulmonary function testing and I still believe the weight gain and restriction are at least in part responsible.  He did get a clinical response from the [facility] would like to continue it.  All of his preferred ICS/LABA medications appear to be tier 3 on his insurance  and he has a high co-pay.  We will try to investigate to see if he can qualify for financial assistance.  May also be able to find an alternative with a lower co-pay although unclear given the tier 3 designation.  We will refill albuterol and he can use 2 puffs as needed.  URI (upper respiratory infection) URI symptoms.  He had COVID-19 in March.  He is going to be retested tomorrow 1/7 to look for reinfection.  He did not get vaccinated, was planning to do so 6 months after his original infection.  He is self isolating.  I asked him to call us to let us know if he is positive.  If negative then we need to get him vaccinated soon as possible.    Follow Up Instructions: 3 months   I discussed the assessment and treatment plan with the patient. The patient was provided an opportunity to ask questions and all were answered. The patient agreed with the plan and demonstrated an understanding of the instructions.   The patient was advised to call back or seek an in-person evaluation if the symptoms worsen or if the condition fails to improve as anticipated.  I provided 16 minutes of non-face-to-face time during this encounter.   Leslye Peer, MD

## 2020-10-15 NOTE — Assessment & Plan Note (Signed)
URI symptoms.  He had COVID-19 in March.  He is going to be retested tomorrow 1/7 to look for reinfection.  He did not get vaccinated, was planning to do so 6 months after his original infection.  He is self isolating.  I asked him to call us to let us know if he is positive.  If negative then we need to get him vaccinated soon as possible.

## 2020-10-15 NOTE — Assessment & Plan Note (Signed)
Disease on his pulmonary function testing and I still believe the weight gain and restriction are at least in part responsible.  He did get a clinical response from the [facility] would like to continue it.  All of his preferred ICS/LABA medications appear to be tier 3 on his insurance and he has a high co-pay.  We will try to investigate to see if he can qualify for financial assistance.  May also be able to find an alternative with a lower co-pay although unclear given the tier 3 designation.  We will refill albuterol and he can use 2 puffs as needed.

## 2020-10-16 ENCOUNTER — Other Ambulatory Visit: Payer: No Typology Code available for payment source

## 2020-10-16 ENCOUNTER — Other Ambulatory Visit: Payer: Self-pay

## 2020-10-16 DIAGNOSIS — Z20822 Contact with and (suspected) exposure to covid-19: Secondary | ICD-10-CM

## 2020-10-19 LAB — NOVEL CORONAVIRUS, NAA: SARS-CoV-2, NAA: DETECTED — AB

## 2020-11-02 NOTE — Progress Notes (Deleted)
HPI: FU chest pain and dyspnea. Cardiac catheterization October 2012 revealed normal LV function and 20% right coronary artery. Cardiac CTA September 2021 showed calcium score 54 and minimal nonobstructive coronary disease in the LAD and right coronary artery (less than 25%).  Echocardiogram September 2021 showed normal LV function. Lower ext venous dopplers 10/21 showed no DVT. CTA October 2021 showed no pulmonary embolus.  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  DLCO normal. Since last seen,   Current Outpatient Medications  Medication Sig Dispense Refill  . albuterol (PROAIR HFA) 108 (90 Base) MCG/ACT inhaler ProAir HFA 90 mcg/actuation aerosol inhaler  INHALE 1 TO 2 PUFFS EVERY 4 TO 6 HOURS AS NEEDED FOR WHEEZING 8 g 0  . B Complex-C (B-COMPLEX WITH VITAMIN C) tablet Take 1 tablet by mouth daily.    . diphenoxylate-atropine (LOMOTIL) 2.5-0.025 MG tablet Lomotil 2.5 mg-0.025 mg tablet  take 2 po qid prn diarrhea    . fluticasone furoate-vilanterol (BREO ELLIPTA) 100-25 MCG/INH AEPB Inhale 1 puff into the lungs daily. 60 each 5  . furosemide (LASIX) 20 MG tablet Take 1 tablet (20 mg total) by mouth every other day. 45 tablet 3  . lisinopril (ZESTRIL) 10 MG tablet Take 10 mg by mouth daily.    . Multiple Minerals-Vitamins (CALCIUM-MAGNESIUM-ZINC-D3 PO) Take 1 tablet by mouth daily.    . Potassium 99 MG TABS Take 1 tablet by mouth every other day.    . rosuvastatin (CRESTOR) 20 MG tablet Take 10 mg by mouth daily.     [name] Specialty Vitamins Products (PROSTATE PO) Take 1 tablet by mouth daily.    [name] UNABLE TO FIND Take 2 tablets by mouth daily. Med Name: [name]     No current facility-administered medications for this visit.     Past Medical History:  Diagnosis Date  . Allergic rhinitis   . Anemia   . Arthritis    rhuematoid and osteo arthritis  . Chronic headache    migraines  . Diverticulitis   . GERD (gastroesophageal  reflux disease)   . History of benign prostatic hypertrophy   . Hyperlipidemia   . Hypertension   . Restless leg   . Tricuspid regurgitation   . Unspecified asthma(493.90)     Past Surgical History:  Procedure Laterality Date  . INGUINAL HERNIA REPAIR    . KNEE ARTHROSCOPY    . PATELLA-FEMORAL ARTHROPLASTY Left 07/14/2016   Procedure: LEFT KNEE PATELLA-FEMORAL  REPLACEMENT ARTHROPLASTY;  Surgeon: Loreta Ave, MD;  Location: [facility];  Service: Orthopedics;  Laterality: Left;  . TENDON REPAIR    . TONSILLECTOMY      Social History   Socioeconomic History  . Marital status: Single    Spouse name: Not on file  . Number of children: Not on file  . Years of education: Not on file  . Highest education level: Not on file  Occupational History  . Not on file  Tobacco Use  . Smoking status: Former Smoker    Packs/day: 3.00    Years: 1.00    Pack years: 3.00    Types: Cigarettes  . Smokeless tobacco: Never Used  . Tobacco comment: stopped at age 20  Vaping Use  . Vaping Use: Never used  Substance and Sexual Activity  . Alcohol use: Yes    Comment: 2 drinks per day  . Drug use: No  . Sexual activity: Never  Other Topics Concern  .  Not on file  Social History Narrative   HS[employer], working on college degree      single. Was employed at [employer] until December.         Social Determinants of Health   Financial Resource Strain: Not on file  Food Insecurity: Not on file  Transportation Needs: Not on file  Physical Activity: Not on file  Stress: Not on file  Social Connections: Not on file  Intimate Partner Violence: Not on file    Family History  Problem Relation Age of Onset  . Prostate cancer Father   . Hypertension Father   . Heart failure Father   . Leukemia Father   . Lung cancer Mother   . Obesity Mother   . Colon cancer Maternal Grandmother   . Diabetes Other        FH- maternal side  . Prostate cancer Other        FH-maternal side  .  Esophageal cancer Neg Hx   . Rectal cancer Neg Hx   . Stomach cancer Neg Hx     ROS: no fevers or chills, productive cough, hemoptysis, dysphasia, odynophagia, melena, hematochezia, dysuria, hematuria, rash, seizure activity, orthopnea, PND, pedal edema, claudication. Remaining systems are negative.  Physical Exam: Well-developed well-nourished in no acute distress.  Skin is warm and dry.  HEENT is normal.  Neck is supple.  Chest is clear to auscultation with normal expansion.  Cardiovascular exam is regular rate and rhythm.  Abdominal exam nontender or distended. No masses palpated. Extremities show no edema. neuro grossly intact  ECG- personally reviewed  A/P  1 chest pain-previous evaluation included cardiac CTA showing no obstructive coronary disease and echocardiogram showing normal LV function; BNP 54. CTA showed no pulmonary embolus.   2 hypertension-BP controlled; continue present meds and follow.  3 hyperlipidemia-continue statin.  Olga Millers, MD

## 2020-11-16 ENCOUNTER — Ambulatory Visit: Payer: No Typology Code available for payment source | Admitting: Cardiology

## 2021-01-04 ENCOUNTER — Telehealth: Payer: Self-pay | Admitting: Emergency Medicine

## 2021-01-04 MED ORDER — BREO ELLIPTA 100-25 MCG/INH IN AEPB: 1.0000 | INHALATION_SPRAY | Freq: Every day | RESPIRATORY_TRACT | 3 refills | Status: DC

## 2021-01-04 NOTE — Telephone Encounter (Signed)
Spoke with Basil Dess and made aware rx printed and faxed to [facility] assistance  Nothing further needed

## 2021-01-19 ENCOUNTER — Other Ambulatory Visit: Payer: Self-pay

## 2021-01-19 ENCOUNTER — Ambulatory Visit (HOSPITAL_COMMUNITY)
Admission: EM | Admit: 2021-01-19 | Discharge: 2021-01-19 | Disposition: A | Discharge status: Home / Self Care | Payer: No Typology Code available for payment source | Attending: Urgent Care | Admitting: Urgent Care

## 2021-01-19 ENCOUNTER — Ambulatory Visit (INDEPENDENT_AMBULATORY_CARE_PROVIDER_SITE_OTHER): Payer: No Typology Code available for payment source

## 2021-01-19 ENCOUNTER — Encounter (HOSPITAL_COMMUNITY): Payer: Self-pay | Admitting: Emergency Medicine

## 2021-01-19 DIAGNOSIS — Z20822 Contact with and (suspected) exposure to covid-19: Secondary | ICD-10-CM | POA: Insufficient documentation

## 2021-01-19 DIAGNOSIS — R0602 Shortness of breath: Secondary | ICD-10-CM

## 2021-01-19 DIAGNOSIS — R059 Cough, unspecified: Secondary | ICD-10-CM | POA: Diagnosis not present

## 2021-01-19 DIAGNOSIS — R062 Wheezing: Secondary | ICD-10-CM | POA: Insufficient documentation

## 2021-01-19 DIAGNOSIS — J45909 Unspecified asthma, uncomplicated: Secondary | ICD-10-CM | POA: Insufficient documentation

## 2021-01-19 LAB — SARS CORONAVIRUS 2 (TAT 6-24 HRS): SARS Coronavirus 2: NEGATIVE

## 2021-01-19 MED ORDER — BENZONATATE 100 MG PO CAPS: 100.0000 mg | ORAL_CAPSULE | Freq: Three times a day (TID) | ORAL | 0 refills | Status: DC | PRN

## 2021-01-19 MED ORDER — PROMETHAZINE-DM 6.25-15 MG/5ML PO SYRP: 5.0000 mL | ORAL_SOLUTION | Freq: Every evening | ORAL | 0 refills | Status: DC | PRN

## 2021-01-19 MED ORDER — METHYLPREDNISOLONE SODIUM SUCC 125 MG IJ SOLR
125.0000 mg | Freq: Once | INTRAMUSCULAR | Status: AC
  Administered 2021-01-19: 125 mg via INTRAMUSCULAR

## 2021-01-19 MED ORDER — METHYLPREDNISOLONE SODIUM SUCC 125 MG IJ SOLR
INTRAMUSCULAR | Status: AC
  Filled 2021-01-19: qty 2

## 2021-01-19 NOTE — ED Triage Notes (Signed)
Pt presents with productive cough, wheezing, and SOB xs 1 week.

## 2021-01-19 NOTE — ED Provider Notes (Signed)
[facility]   MRN: [id number] DOB: [birth year]  Subjective:   Andre Turner is a 60 y.o. male presenting for 1 week history of persistent productive cough, wheezing, shortness of breath.  Patient states that he is using his Breo Ellipta, albuterol inhaler, last treatment was this morning.  States that it helps but is very temporary.  He has had COVID twice, last episode was in January.  Has a history of allergic rhinitis, reactive airway disease, unspecified asthma.  Patient is not a smoker.  Denies any fever, chest pain, sinus pain, throat pain.  No body aches.  No current facility-administered medications for this encounter.  Current Outpatient Medications:  .  albuterol (PROAIR HFA) 108 (90 Base) MCG/ACT inhaler, ProAir HFA 90 mcg/actuation aerosol inhaler  INHALE 1 TO 2 PUFFS EVERY 4 TO 6 HOURS AS NEEDED FOR WHEEZING, Disp: 8 g, Rfl: 0 .  B Complex-C (B-COMPLEX WITH VITAMIN C) tablet, Take 1 tablet by mouth daily., Disp: , Rfl:  .  diphenoxylate-atropine (LOMOTIL) 2.5-0.025 MG tablet, Lomotil 2.5 mg-0.025 mg tablet  take 2 po qid prn diarrhea, Disp: , Rfl:  .  fluticasone furoate-vilanterol (BREO ELLIPTA) 100-25 MCG/INH AEPB, Inhale 1 puff into the lungs daily., Disp: 90 each, Rfl: 3 .  furosemide (LASIX) 20 MG tablet, Take 1 tablet (20 mg total) by mouth every other day., Disp: 45 tablet, Rfl: 3 .  lisinopril (ZESTRIL) 10 MG tablet, Take 10 mg by mouth daily., Disp: , Rfl:  .  Multiple Minerals-Vitamins (CALCIUM-MAGNESIUM-ZINC-D3 PO), Take 1 tablet by mouth daily., Disp: , Rfl:  .  Potassium 99 MG TABS, Take 1 tablet by mouth every other day., Disp: , Rfl:  .  rosuvastatin (CRESTOR) 20 MG tablet, Take 10 mg by mouth daily. , Disp: , Rfl:  .  Specialty Vitamins Products (PROSTATE PO), Take 1 tablet by mouth daily., Disp: , Rfl:  .  UNABLE TO FIND, Take 2 tablets by mouth daily. Med Name: Mervyn Gay puama, Disp: , Rfl:    Allergies  Allergen Reactions  .  Celebrex [Celecoxib] Itching  . Percocet [Oxycodone-Acetaminophen]   . Sulfa Antibiotics Nausea And Vomiting and Itching  . Sulfonamide Derivatives Hives    REACTION: causes nausea , itchy  . Tetracycline Nausea Only  . Latex Rash    Past Medical History:  Diagnosis Date  . Allergic rhinitis   . Anemia   . Arthritis    rhuematoid and osteo arthritis  . Chronic headache    migraines  . Diverticulitis   . GERD (gastroesophageal reflux disease)   . History of benign prostatic hypertrophy   . Hyperlipidemia   . Hypertension   . Restless leg   . Tricuspid regurgitation   . Unspecified asthma(493.90)      Past Surgical History:  Procedure Laterality Date  . INGUINAL HERNIA REPAIR    . KNEE ARTHROSCOPY    . PATELLA-FEMORAL ARTHROPLASTY Left 07/14/2016   Procedure: LEFT KNEE PATELLA-FEMORAL  REPLACEMENT ARTHROPLASTY;  Surgeon: Loreta Ave, MD;  Location: [facility];  Service: Orthopedics;  Laterality: Left;  . TENDON REPAIR    . TONSILLECTOMY      Family History  Problem Relation Age of Onset  . Prostate cancer Father   . Hypertension Father   . Heart failure Father   . Leukemia Father   . Lung cancer Mother   . Obesity Mother   . Colon cancer Maternal Grandmother   . Diabetes Other  FH- maternal side  . Prostate cancer Other        FH-maternal side  . Esophageal cancer Neg Hx   . Rectal cancer Neg Hx   . Stomach cancer Neg Hx     Social History   Tobacco Use  . Smoking status: Former Smoker    Packs/day: 3.00    Years: 1.00    Pack years: 3.00    Types: Cigarettes  . Smokeless tobacco: Never Used  . Tobacco comment: stopped at age 77  Vaping Use  . Vaping Use: Never used  Substance Use Topics  . Alcohol use: Yes    Comment: 2 drinks per day  . Drug use: No    ROS   Objective:   Vitals: BP (!) 143/81 (BP Location: Right Arm)   Pulse 83   Temp 98.6 F (37 C) (Oral)   Resp 16   SpO2 94%   Physical  Exam Constitutional:      General: He is not in acute distress.    Appearance: Normal appearance. He is well-developed. He is not ill-appearing, toxic-appearing or diaphoretic.  HENT:     Head: Normocephalic and atraumatic.     Right Ear: External ear normal.     Left Ear: External ear normal.     Nose: Nose normal.     Mouth/Throat:     Mouth: Mucous membranes are moist.     Pharynx: Oropharynx is clear.  Eyes:     General: No scleral icterus.    Extraocular Movements: Extraocular movements intact.     Pupils: Pupils are equal, round, and reactive to light.  Cardiovascular:     Rate and Rhythm: Normal rate and regular rhythm.     Heart sounds: Normal heart sounds. No murmur heard. No friction rub. No gallop.   Pulmonary:     Effort: Pulmonary effort is normal. No respiratory distress.     Breath sounds: No stridor. Wheezing and rhonchi present. No rales.  Skin:    General: Skin is warm and dry.  Neurological:     Mental Status: He is alert and oriented to person, place, and time.  Psychiatric:        Mood and Affect: Mood normal.        Behavior: Behavior normal.        Thought Content: Thought content normal.     DG Chest 2 View  Result Date: 01/19/2021 CLINICAL DATA:  Cough, shortness of breath EXAM: CHEST - 2 VIEW COMPARISON:  07/13/2020 FINDINGS: Borderline heart size. Lungs clear. No effusions or edema. No acute bony abnormality. IMPRESSION: No active cardiopulmonary disease. Electronically Signed   By: Charlett Nose M.D.   On: 01/19/2021 10:07     Assessment and Plan :   PDMP not reviewed this encounter.  1. Reactive airway disease without complication, unspecified asthma severity, unspecified whether persistent   2. Shortness of breath   3. Wheezing   4. Cough     IM Solu-Medrol in clinic.  Recommended cough suppression medications, maintain Breo Ellipta and albuterol.  COVID-19 testing pending. Counseled patient on potential for adverse effects with  medications prescribed/recommended today, ER and return-to-clinic precautions discussed, patient verbalized understanding.    Wallis Bamberg, PA-C 01/19/21 1021

## 2021-01-23 NOTE — Progress Notes (Signed)
HPI: FU CP and dyspnea. Cardiac catheterization October 2012 revealed normal LV function and 20% right coronary artery. ProBNP May 27, 2020 was 21.  Echocardiogram September 2021 showed normal LV function and no significant valvular heart disease.  Coronary CTA September 2021 showed calcium score 54 and minimal nonobstructive coronary disease.  CTA October 2021 showed no pulmonary embolus.  Venous Dopplers October 2021 showed no evidence of DVT.  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  Chest x-ray April 2022 showed no active cardiopulmonary disease.  Since last seen the patient has dyspnea with more extreme activities but not with routine activities. It is relieved with rest. It is not associated with chest pain. There is no orthopnea, PND. There is no syncope or palpitations. There is no exertional chest pain.  Minimal pedal edema.   Current Outpatient Medications  Medication Sig Dispense Refill  . albuterol (PROAIR HFA) 108 (90 Base) MCG/ACT inhaler ProAir HFA 90 mcg/actuation aerosol inhaler  INHALE 1 TO 2 PUFFS EVERY 4 TO 6 HOURS AS NEEDED FOR WHEEZING 8 g 0  . B Complex-C (B-COMPLEX WITH VITAMIN C) tablet Take 1 tablet by mouth daily.    . diphenoxylate-atropine (LOMOTIL) 2.5-0.025 MG tablet Lomotil 2.5 mg-0.025 mg tablet  take 2 po qid prn diarrhea    . fluticasone furoate-vilanterol (BREO ELLIPTA) 100-25 MCG/INH AEPB Inhale 1 puff into the lungs daily. 90 each 3  . furosemide (LASIX) 20 MG tablet Take 1 tablet (20 mg total) by mouth every other day. 45 tablet 3  . hydrOXYzine (ATARAX/VISTARIL) 25 MG tablet Take 1 tablet by mouth 2 (two) times daily.    [name] lisinopril (ZESTRIL) 10 MG tablet Take 10 mg by mouth daily.    . Multiple Minerals-Vitamins (CALCIUM-MAGNESIUM-ZINC-D3 PO) Take 1 tablet by mouth daily.    . Potassium 99 MG TABS Take 1 tablet by mouth every other day.    . rosuvastatin (CRESTOR) 20 MG tablet Take  10 mg by mouth daily.     [name] Specialty Vitamins Products (PROSTATE PO) Take 1 tablet by mouth daily.    [name] UNABLE TO FIND Take 2 tablets by mouth daily. Med Name: [name]     No current facility-administered medications for this visit.     Past Medical History:  Diagnosis Date  . Allergic rhinitis   . Anemia   . Arthritis    rhuematoid and osteo arthritis  . Chronic headache    migraines  . Diverticulitis   . GERD (gastroesophageal reflux disease)   . History of benign prostatic hypertrophy   . Hyperlipidemia   . Hypertension   . Restless leg   . Tricuspid regurgitation   . Unspecified asthma(493.90)     Past Surgical History:  Procedure Laterality Date  . INGUINAL HERNIA REPAIR    . KNEE ARTHROSCOPY    . PATELLA-FEMORAL ARTHROPLASTY Left 07/14/2016   Procedure: LEFT KNEE PATELLA-FEMORAL  REPLACEMENT ARTHROPLASTY;  Surgeon: Loreta Ave, MD;  Location: [facility];  Service: Orthopedics;  Laterality: Left;  . TENDON REPAIR    . TONSILLECTOMY      Social History   Socioeconomic History  . Marital status: Single    Spouse name: Not on file  . Number of children: Not on file  . Years of education: Not on file  . Highest education level: Not on file  Occupational History  . Not on file  Tobacco Use  . Smoking status: Former Smoker  Packs/day: 3.00    Years: 1.00    Pack years: 3.00    Types: Cigarettes  . Smokeless tobacco: Never Used  . Tobacco comment: stopped at age 75  Vaping Use  . Vaping Use: Never used  Substance and Sexual Activity  . Alcohol use: Yes    Comment: 2 drinks per day  . Drug use: No  . Sexual activity: Never  Other Topics Concern  . Not on file  Social History Narrative   HSG, working on college degree      single. Was employed at [employer] until December.         Social Determinants of Health   Financial Resource Strain: Not on file  Food Insecurity: Not on file  Transportation Needs: Not on file  Physical  Activity: Not on file  Stress: Not on file  Social Connections: Not on file  Intimate Partner Violence: Not on file    Family History  Problem Relation Age of Onset  . Prostate cancer Father   . Hypertension Father   . Heart failure Father   . Leukemia Father   . Lung cancer Mother   . Obesity Mother   . Colon cancer Maternal Grandmother   . Diabetes Other        FH- maternal side  . Prostate cancer Other        FH-maternal side  . Esophageal cancer Neg Hx   . Rectal cancer Neg Hx   . Stomach cancer Neg Hx     ROS: Knee arthralgias but no fevers or chills, productive cough, hemoptysis, dysphasia, odynophagia, melena, hematochezia, dysuria, hematuria, rash, seizure activity, orthopnea, PND, claudication. Remaining systems are negative.  Physical Exam: Well-developed well-nourished in no acute distress.  Skin is warm and dry.  HEENT is normal.  Neck is supple.  Chest is clear to auscultation with normal expansion.  Cardiovascular exam is regular rate and rhythm.  Abdominal exam nontender or distended. No masses palpated. Extremities show trace edema. neuro grossly intact  ECG-normal sinus rhythm at a rate of 80, no ST changes.  Personally reviewed  A/P  1 coronary artery disease-Minor on recent cardiac CTA.  Plan to continue medical therapy.  2 dyspnea -Echocardiogram showed normal LV function and no obstructive coronary disease on CTA.  He is followed by Dr. Delton Coombes who feels that some of his dyspnea is likely related to restrictive disease and weight gain.  Some improvement in symptoms with Breo.  3 hypertension-blood pressure controlled.  Continue present medical regimen.  4 hyperlipidemia-continue statin.  Olga Millers, MD

## 2021-01-28 ENCOUNTER — Ambulatory Visit (INDEPENDENT_AMBULATORY_CARE_PROVIDER_SITE_OTHER): Payer: No Typology Code available for payment source | Admitting: Cardiology

## 2021-01-28 ENCOUNTER — Other Ambulatory Visit: Payer: Self-pay

## 2021-01-28 ENCOUNTER — Encounter: Payer: Self-pay | Admitting: Cardiology

## 2021-01-28 VITALS — BP 120/62 | HR 80 | Ht 66.5 in | Wt 251.0 lb

## 2021-01-28 DIAGNOSIS — R0602 Shortness of breath: Secondary | ICD-10-CM

## 2021-01-28 DIAGNOSIS — I2510 Atherosclerotic heart disease of native coronary artery without angina pectoris: Secondary | ICD-10-CM

## 2021-01-28 DIAGNOSIS — E7800 Pure hypercholesterolemia, unspecified: Secondary | ICD-10-CM | POA: Diagnosis not present

## 2021-01-28 DIAGNOSIS — I10 Essential (primary) hypertension: Secondary | ICD-10-CM

## 2021-01-28 NOTE — Patient Instructions (Signed)

## 2021-02-04 ENCOUNTER — Telehealth: Payer: Self-pay | Admitting: *Deleted

## 2021-02-04 NOTE — Telephone Encounter (Signed)
   Primary Cardiologist: Olga Millers, MD  Chart reviewed as part of pre-operative protocol coverage. Given past medical history and time since last visit, based on ACC/AHA guidelines, Maveric Debono would be at acceptable risk for the planned procedure without further cardiovascular testing.   His RCRI is a class I risk, 0.4% risk of major cardiac event.  I will route this recommendation to the requesting party via Epic fax function and remove from pre-op pool.  Please call with questions.  Andre Turner. Lois Ostrom NP-C    02/04/2021, 7:39 AM [facility] [address] Office [phone] Fax [phone]

## 2021-02-04 NOTE — Telephone Encounter (Signed)
   [facility] Pre-operative Risk Assessment    Patient Name: Andre Turner  DOB: 1961-08-30  MRN: [id number]   HEARTCARE STAFF: - Please ensure there is not already an duplicate clearance open for this procedure. - Under Visit Info/Reason for Call, type in Other and utilize the format Clearance MM/DD/YY or Clearance TBD. Do not use dashes or single digits. - If request is for dental extraction, please clarify the # of teeth to be extracted.  Request for surgical clearance:  1. What type of surgery is being performed? Left total knee revision   2. When is this surgery scheduled? TBD   3. What type of clearance is required (medical clearance vs. Pharmacy clearance to hold med vs. Both)? medical  4. Are there any medications that need to be held prior to surgery and how long?none   5. Practice name and name of physician performing surgery? [facility]  6. What is the office phone number? [phone]   7.   What is the office fax number? [phone]  8.   Anesthesia type (None, local, MAC, general) ? spinal   Fredia Beets 02/04/2021, 7:20 AM  _________________________________________________________________   (provider comments below)

## 2021-02-05 ENCOUNTER — Telehealth: Payer: Self-pay | Admitting: Emergency Medicine

## 2021-02-09 ENCOUNTER — Encounter: Payer: Self-pay | Admitting: Emergency Medicine

## 2021-02-09 ENCOUNTER — Other Ambulatory Visit: Payer: Self-pay

## 2021-02-09 ENCOUNTER — Ambulatory Visit (INDEPENDENT_AMBULATORY_CARE_PROVIDER_SITE_OTHER): Payer: No Typology Code available for payment source | Admitting: Emergency Medicine

## 2021-02-09 ENCOUNTER — Telehealth: Payer: Self-pay | Admitting: Emergency Medicine

## 2021-02-09 DIAGNOSIS — J984 Other disorders of lung: Secondary | ICD-10-CM

## 2021-02-09 DIAGNOSIS — J4520 Mild intermittent asthma, uncomplicated: Secondary | ICD-10-CM | POA: Diagnosis not present

## 2021-02-09 NOTE — Patient Instructions (Addendum)
Your exam today shows improvement since your recent flareup in April.  No barriers to proceeding with your knee surgery.  We will notify your orthopedic surgeon that you are at low risk to proceed. Please continue Breo 1 inhalation once daily.  Rinse and gargle after using. Keep your albuterol available use 2 puffs if needed for shortness of breath, chest tightness, wheezing. Work hard on diet, exercise and weight loss. Follow with Dr. Delton Coombes in 12 months or sooner if you have any problems.

## 2021-02-09 NOTE — Telephone Encounter (Signed)
OV noted from 02/09/21 containing risk assessment was sent to fax for Dr. Rolly Salter office.

## 2021-02-09 NOTE — Assessment & Plan Note (Signed)
Due to his obesity.  Encourage diet, exercise, weight loss

## 2021-02-09 NOTE — Assessment & Plan Note (Addendum)
He had flaring symptoms and April.  Is typically well controlled.  His symptoms have resolved and are now back to usual baseline after being seen in urgent care.  He is on an ACE inhibitor but is tolerating.  We could consider changing at some point in the future if symptoms become persistent.  Plan to continue his Breo, albuterol if needed.  He is low risk for surgery or general anesthesia due to the asthma. No current contraindications to setting up the surgery and proceeding.

## 2021-02-09 NOTE — Progress Notes (Signed)
Subjective:    Patient ID: Andre Turner, male    DOB: Jul 02, 1961, 60 y.o.   MRN: [id number]  HPI 60 year old former smoker (3 pack years) with a history of rheumatoid arthritis, hypertension, allergic rhinitis, GERD, RLS.  He carries a history of possible asthma, has never really been worked up.   He is referred today for evaluation of shortness of breath.  He had COVID-19 pneumonia in April 2029. Has dyspnea that has been evolving in the last 2 yrs. He had a partial knee replacement and afterwards the knee was unstable, changed his level of exertion. He has gained about 70 lbs over the 2 yrs. When he is working, lifting objects, he has to stop due to dyspnea. Lots of trouble bending over.  He felt even worse when he had COVID as above. May be a bit better than during the acute illness but still clearly abnormal. Sometimes hears some wheeze, especially at night. No real cough. Occasional CP when he is exerting, associated with SOB. Seems to resolve with rest. May snore, no witnessed apneas to his knowledge.   CT abd/pelvis ([facility]) 05/04/2020 reviewed by me showed dependent atelectasis at both bases, as subpleural 3 mm left lower lobe nodule, small hiatal hernia, no overt evidence of interstitial lung disease post Covid.    ROV 02/09/21 --60 year old man with a minimal tobacco exposure, history of RA, hypertension (ACE inhibitor), GERD, rhinitis, mixed obstruction and restriction by PFT suggestive of possible mild asthma.  He had a clinical benefit from Breo   60 yo former minimal minimal smoker w HTN, RA, GERD, rhinitis, GERD.  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  He was tried on empiric Breo to see if he would get benefit.  He had COVID in March / April 2021, then again in Winter 2021.  He was seen in the urgent care 4/12 with flaring symptoms including productive cough, wheeze, dyspnea.  He was treated with Depo-Medrol x1. No other flares since last  year.  Remains on Breo, albuterol use has decreased, about 3x last month.  Remains on lisinopril Planning for L knee replacement in [place], needs to be cleared for this.   PFT 08/14/2020 reviewed, showed spirometry suggestive of mild mixed disease but normal volumes.  No significant evidence for obstruction on his flow volume loop.  Review of Systems As per HPI  Past Medical History:  Diagnosis Date  . Allergic rhinitis   . Anemia   . Arthritis    rhuematoid and osteo arthritis  . Chronic headache    migraines  . Diverticulitis   . GERD (gastroesophageal reflux disease)   . History of benign prostatic hypertrophy   . Hyperlipidemia   . Hypertension   . Restless leg   . Tricuspid regurgitation   . Unspecified asthma(493.90)      Family History  Problem Relation Age of Onset  . Prostate cancer Father   . Hypertension Father   . Heart failure Father   . Leukemia Father   . Lung cancer Mother   . Obesity Mother   . Colon cancer Maternal Grandmother   . Diabetes Other        FH- maternal side  . Prostate cancer Other        FH-maternal side  . Esophageal cancer Neg Hx   . Rectal cancer Neg Hx   . Stomach cancer Neg Hx     Mother >> lung cancer  Social History   Socioeconomic History  .  Marital status: Single    Spouse name: Not on file  . Number of children: Not on file  . Years of education: Not on file  . Highest education level: Not on file  Occupational History  . Not on file  Tobacco Use  . Smoking status: Former Smoker    Packs/day: 3.00    Years: 1.00    Pack years: 3.00    Types: Cigarettes  . Smokeless tobacco: Never Used  . Tobacco comment: stopped at age 30  Vaping Use  . Vaping Use: Never used  Substance and Sexual Activity  . Alcohol use: Yes    Comment: 2 drinks per day  . Drug use: No  . Sexual activity: Never  Other Topics Concern  . Not on file  Social History Narrative   HSG, working on college degree      single. Was employed at [employer]  until December.         Social Determinants of Health   Financial Resource Strain: Not on file  Food Insecurity: Not on file  Transportation Needs: Not on file  Physical Activity: Not on file  Stress: Not on file  Social Connections: Not on file  Intimate Partner Violence: Not on file    Has worked [employer] representative, [employer], [employer].  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  From [place]  Allergies  Allergen Reactions  . Celebrex [Celecoxib] Itching  . Percocet [Oxycodone-Acetaminophen]   . Sulfa Antibiotics Nausea And Vomiting and Itching  . Sulfonamide Derivatives Hives    REACTION: causes nausea , itchy  . Tetracycline Nausea Only  . Latex Rash     Outpatient Medications Prior to Visit  Medication Sig Dispense Refill  . albuterol (PROAIR HFA) 108 (90 Base) MCG/ACT inhaler ProAir HFA 90 mcg/actuation aerosol inhaler  INHALE 1 TO 2 PUFFS EVERY 4 TO 6 HOURS AS NEEDED FOR WHEEZING 8 g 0  . B Complex-C (B-COMPLEX WITH VITAMIN C) tablet Take 1 tablet by mouth daily.    . diphenoxylate-atropine (LOMOTIL) 2.5-0.025 MG tablet Lomotil 2.5 mg-0.025 mg tablet  take 2 po qid prn diarrhea    . fluticasone furoate-vilanterol (BREO ELLIPTA) 100-25 MCG/INH AEPB Inhale 1 puff into the lungs daily. 90 each 3  . furosemide (LASIX) 20 MG tablet Take 1 tablet (20 mg total) by mouth every other day. 45 tablet 3  . hydrOXYzine (ATARAX/VISTARIL) 25 MG tablet Take 1 tablet by mouth 2 (two) times daily.    [name] lisinopril (ZESTRIL) 10 MG tablet Take 10 mg by mouth daily.    . Multiple Minerals-Vitamins (CALCIUM-MAGNESIUM-ZINC-D3 PO) Take 1 tablet by mouth daily.    . Potassium 99 MG TABS Take 1 tablet by mouth every other day.    . rosuvastatin (CRESTOR) 20 MG tablet Take 10 mg by mouth daily.     [name] Specialty Vitamins Products (PROSTATE PO) Take 1 tablet by mouth daily.    [name] UNABLE TO FIND Take 2 tablets by mouth daily. Med Name: Mervyn Gay puama      No facility-administered medications prior to visit.        Objective:   Physical Exam Vitals:   02/09/21 1040  BP: 138/78  Pulse: 83  Temp: 97.6 F (36.4 C)  TempSrc: Temporal  SpO2: 97%  Weight: 251 lb 12.8 oz (114.2 kg)  Height: 5\' 7"  (1.702 m)   Gen: Pleasant, obese man, in no distress,  normal affect  ENT: No lesions,  mouth clear,  oropharynx clear, no postnasal drip  Neck: No JVD, no stridor  Lungs: No use of accessory muscles, small breaths, no crackles or wheezing on normal respiration, no wheeze on forced expiration  Cardiovascular: RRR, heart sounds normal, no murmur or gallops, 1+ R ankle edema, none on L  Musculoskeletal: No deformities, no cyanosis or clubbing  Neuro: alert, awake, non focal  Skin: Warm, no lesions or rash      Assessment & Plan:  Mild intermittent asthma He had flaring symptoms and April.  Is typically well controlled.  His symptoms have resolved and are now back to usual baseline after being seen in urgent care.  He is on an ACE inhibitor but is tolerating.  We could consider changing at some point in the future if symptoms become persistent.  Plan to continue his Breo, albuterol if needed.  He is low risk for surgery or general anesthesia due to the asthma. No current contraindications to setting up the surgery and proceeding.   Restrictive lung disease Due to his obesity.  Encourage diet, exercise, weight loss  Levy Pupa, MD, PhD 02/09/2021, 1:35 PM [facility] [phone] or if no answer [phone]

## 2021-02-09 NOTE — Telephone Encounter (Signed)
Patient seen on 02/09/2021 -pr

## 2021-02-17 ENCOUNTER — Ambulatory Visit (HOSPITAL_COMMUNITY)
Admission: EM | Admit: 2021-02-17 | Discharge: 2021-02-17 | Disposition: A | Discharge status: Home / Self Care | Payer: No Typology Code available for payment source | Attending: Internal Medicine | Admitting: Internal Medicine

## 2021-02-17 ENCOUNTER — Ambulatory Visit (INDEPENDENT_AMBULATORY_CARE_PROVIDER_SITE_OTHER): Payer: No Typology Code available for payment source

## 2021-02-17 ENCOUNTER — Encounter (HOSPITAL_COMMUNITY): Payer: Self-pay

## 2021-02-17 ENCOUNTER — Other Ambulatory Visit: Payer: Self-pay

## 2021-02-17 DIAGNOSIS — Z7951 Long term (current) use of inhaled steroids: Secondary | ICD-10-CM | POA: Insufficient documentation

## 2021-02-17 DIAGNOSIS — R062 Wheezing: Secondary | ICD-10-CM | POA: Insufficient documentation

## 2021-02-17 DIAGNOSIS — Z87891 Personal history of nicotine dependence: Secondary | ICD-10-CM | POA: Insufficient documentation

## 2021-02-17 DIAGNOSIS — R0789 Other chest pain: Secondary | ICD-10-CM

## 2021-02-17 DIAGNOSIS — R079 Chest pain, unspecified: Secondary | ICD-10-CM | POA: Diagnosis present

## 2021-02-17 DIAGNOSIS — R0602 Shortness of breath: Secondary | ICD-10-CM

## 2021-02-17 DIAGNOSIS — Z8616 Personal history of COVID-19: Secondary | ICD-10-CM | POA: Insufficient documentation

## 2021-02-17 DIAGNOSIS — Z885 Allergy status to narcotic agent status: Secondary | ICD-10-CM | POA: Insufficient documentation

## 2021-02-17 DIAGNOSIS — J101 Influenza due to other identified influenza virus with other respiratory manifestations: Secondary | ICD-10-CM | POA: Insufficient documentation

## 2021-02-17 DIAGNOSIS — Z20822 Contact with and (suspected) exposure to covid-19: Secondary | ICD-10-CM | POA: Insufficient documentation

## 2021-02-17 DIAGNOSIS — Z28311 Partially vaccinated for covid-19: Secondary | ICD-10-CM | POA: Insufficient documentation

## 2021-02-17 DIAGNOSIS — Z79899 Other long term (current) drug therapy: Secondary | ICD-10-CM | POA: Insufficient documentation

## 2021-02-17 DIAGNOSIS — Z886 Allergy status to analgesic agent status: Secondary | ICD-10-CM | POA: Insufficient documentation

## 2021-02-17 DIAGNOSIS — Z881 Allergy status to other antibiotic agents status: Secondary | ICD-10-CM | POA: Insufficient documentation

## 2021-02-17 DIAGNOSIS — Z882 Allergy status to sulfonamides status: Secondary | ICD-10-CM | POA: Diagnosis not present

## 2021-02-17 LAB — POC INFLUENZA A AND B ANTIGEN (URGENT CARE ONLY)
INFLUENZA A ANTIGEN, POC: POSITIVE — AB
INFLUENZA B ANTIGEN, POC: NEGATIVE

## 2021-02-17 LAB — SARS CORONAVIRUS 2 (TAT 6-24 HRS): SARS Coronavirus 2: NEGATIVE

## 2021-02-17 MED ORDER — OSELTAMIVIR PHOSPHATE 75 MG PO CAPS: 75.0000 mg | ORAL_CAPSULE | Freq: Two times a day (BID) | ORAL | 0 refills | Status: DC

## 2021-02-17 MED ORDER — METHYLPREDNISOLONE ACETATE 80 MG/ML IJ SUSP
INTRAMUSCULAR | Status: AC
  Filled 2021-02-17: qty 1

## 2021-02-17 MED ORDER — ALBUTEROL SULFATE HFA 108 (90 BASE) MCG/ACT IN AERS
2.0000 | INHALATION_SPRAY | Freq: Once | RESPIRATORY_TRACT | Status: AC
  Administered 2021-02-17: 2 via RESPIRATORY_TRACT

## 2021-02-17 MED ORDER — ALBUTEROL SULFATE HFA 108 (90 BASE) MCG/ACT IN AERS
INHALATION_SPRAY | RESPIRATORY_TRACT | Status: AC
  Filled 2021-02-17: qty 6.7

## 2021-02-17 MED ORDER — METHYLPREDNISOLONE ACETATE 80 MG/ML IJ SUSP: 60.0000 mg | Freq: Once | INTRAMUSCULAR | Status: DC

## 2021-02-17 NOTE — Discharge Instructions (Addendum)
Your chest x-ray was normal.  You tested positive for influenza A so we are going to start Tamiflu.  Please continue your albuterol and Breo Ellipta as previously prescribed.  Rest and drink plenty of fluid.  If you have any worsening symptoms please go to the emergency room.

## 2021-02-17 NOTE — ED Triage Notes (Addendum)
Pt c/o congestion, chest pain on both sides, cough, fever, diarrhea, some shob x3 days. Pt describes feeling that pain is in "my lungs". Audible wheezing noted.

## 2021-02-17 NOTE — ED Provider Notes (Signed)
[facility]    CSN: [id number] Arrival date & time: 02/17/21  1055      History   Chief Complaint Chief Complaint  Patient presents with  . Cough  . Chest Pain  . Nasal Congestion  . shob  . Wheezing    HPI Andre Turner is a 60 y.o. male.   Patient presents today with a 2-day history of cough.  Reports associated drainage, chest tightness, shortness of breath, wheezing.  He denies formal diagnosis of asthma but has had similar symptoms with illness in the past.  He has been using albuterol inhaler intermittently with improvement but not resolution of symptoms.  He denies any known sick contacts with similar symptoms.  Has had COVID twice in the past most recently 5 months ago.  He is up-to-date on COVID vaccinations but has not had booster.  He did not receive flu shot this year.  Denies any recent antibiotic use.  He has been using over-the-counter medications without improvement of symptoms.  Denies history of COPD or smoking.  Does have allergies but is not take medication for this.  He is having difficulty with daily activities as a result of symptoms.     Past Medical History:  Diagnosis Date  . Allergic rhinitis   . Anemia   . Arthritis    rhuematoid and osteo arthritis  . Chronic headache    migraines  . Diverticulitis   . GERD (gastroesophageal reflux disease)   . History of benign prostatic hypertrophy   . Hyperlipidemia   . Hypertension   . Restless leg   . Tricuspid regurgitation   . Unspecified asthma(493.90)     Patient Active Problem List   Diagnosis Date Noted  . URI (upper respiratory infection) 10/15/2020  . Restrictive lung disease 08/17/2020  . Shortness of breath 05/21/2020  . Hypertension 07/13/2011  . Hyperlipidemia 07/13/2011  . Skin lesions, generalized 06/05/2011  . Erectile dysfunction 06/05/2011  . ALLERGIC RHINITIS 06/25/2007  . Mild intermittent asthma 06/25/2007  . HEADACHE, CHRONIC 06/25/2007  . BENIGN  PROSTATIC HYPERTROPHY, HX OF 06/25/2007    Past Surgical History:  Procedure Laterality Date  . INGUINAL HERNIA REPAIR    . KNEE ARTHROSCOPY    . PATELLA-FEMORAL ARTHROPLASTY Left 07/14/2016   Procedure: LEFT KNEE PATELLA-FEMORAL  REPLACEMENT ARTHROPLASTY;  Surgeon: Loreta Ave, MD;  Location: [facility];  Service: Orthopedics;  Laterality: Left;  . TENDON REPAIR    . TONSILLECTOMY         Home Medications    Prior to Admission medications   Medication Sig Start Date End Date Taking? Authorizing Provider  albuterol (PROAIR HFA) 108 (90 Base) MCG/ACT inhaler ProAir HFA 90 mcg/actuation aerosol inhaler  INHALE 1 TO 2 PUFFS EVERY 4 TO 6 HOURS AS NEEDED FOR WHEEZING 07/13/20  Yes Linwood Dibbles, MD  B Complex-C (B-COMPLEX WITH VITAMIN C) tablet Take 1 tablet by mouth daily.   Yes [provider]  diphenoxylate-atropine (LOMOTIL) 2.5-0.025 MG tablet Lomotil 2.5 mg-0.025 mg tablet  take 2 po qid prn diarrhea   Yes [provider]  fluticasone furoate-vilanterol (BREO ELLIPTA) 100-25 MCG/INH AEPB Inhale 1 puff into the lungs daily. 01/04/21  Yes Nyoka Cowden, MD  hydrOXYzine (ATARAX/VISTARIL) 25 MG tablet Take 1 tablet by mouth 2 (two) times daily.   Yes [provider]  lisinopril (ZESTRIL) 10 MG tablet Take 10 mg by mouth daily.   Yes [provider]  Multiple Minerals-Vitamins (CALCIUM-MAGNESIUM-ZINC-D3 PO) Take 1  tablet by mouth daily.   Yes [provider]  oseltamivir (TAMIFLU) 75 MG capsule Take 1 capsule (75 mg total) by mouth every 12 (twelve) hours. 02/17/21  Yes Kali Deadwyler K, PA-C  rosuvastatin (CRESTOR) 20 MG tablet Take 10 mg by mouth daily.    Yes [provider]  Specialty Vitamins Products (PROSTATE PO) Take 1 tablet by mouth daily.   Yes [provider]  UNABLE TO FIND Take 2 tablets by mouth daily. Med Name: [name].   Yes [provider]  furosemide (LASIX) 20 MG tablet Take 1  tablet (20 mg total) by mouth every other day. 05/27/20   Lewayne Bunting, MD  Potassium 99 MG TABS Take 1 tablet by mouth every other day.    [provider]    Family History Family History  Problem Relation Age of Onset  . Prostate cancer Father   . Hypertension Father   . Heart failure Father   . Leukemia Father   . Lung cancer Mother   . Obesity Mother   . Colon cancer Maternal Grandmother   . Diabetes Other        FH- maternal side  . Prostate cancer Other        FH-maternal side  . Esophageal cancer Neg Hx   . Rectal cancer Neg Hx   . Stomach cancer Neg Hx     Social History Social History   Tobacco Use  . Smoking status: Former Smoker    Packs/day: 3.00    Years: 1.00    Pack years: 3.00    Types: Cigarettes  . Smokeless tobacco: Never Used  . Tobacco comment: stopped at age 60  Vaping Use  . Vaping Use: Never used  Substance Use Topics  . Alcohol use: Yes    Comment: 2 drinks per day  . Drug use: No     Allergies   Celebrex [celecoxib], Percocet [oxycodone-acetaminophen], Sulfa antibiotics, Sulfonamide derivatives, Tetracycline, and Latex   Review of Systems Review of Systems  Constitutional: Positive for activity change. Negative for appetite change, fatigue and fever.  HENT: Positive for congestion and sinus pressure. Negative for sneezing and sore throat.   Respiratory: Positive for cough, chest tightness, shortness of breath and wheezing.   Cardiovascular: Negative for chest pain.  Gastrointestinal: Negative for abdominal pain, diarrhea, nausea and vomiting.  Musculoskeletal: Negative for arthralgias and myalgias.  Neurological: Negative for dizziness, light-headedness and headaches.     Physical Exam Triage Vital Signs ED Triage Vitals  Enc Vitals Group     BP 02/17/21 1121 120/75     Pulse Rate 02/17/21 1121 82     Resp 02/17/21 1121 18     Temp 02/17/21 1121 98.5 F (36.9 C)     Temp src --      SpO2 02/17/21 1121 96 %      Weight --      Height --      Head Circumference --      Peak Flow --      Pain Score 02/17/21 1109 2     Pain Loc --      Pain Edu? --      Excl. in GC? --    No data found.  Updated Vital Signs BP 120/75   Pulse 82   Temp 98.5 F (36.9 C)   Resp 18   SpO2 96%   Visual Acuity Right Eye Distance:   Left Eye Distance:   Bilateral Distance:  Right Eye Near:   Left Eye Near:    Bilateral Near:     Physical Exam Vitals reviewed.  Constitutional:      General: He is awake.     Appearance: Normal appearance. He is normal weight. He is not ill-appearing.     Comments: Very pleasant male appears stated age in no acute distress  HENT:     Head: Normocephalic and atraumatic.     Right Ear: Ear canal and external ear normal. A middle ear effusion is present. Tympanic membrane is not erythematous or bulging.     Left Ear: Ear canal and external ear normal. A middle ear effusion is present. Tympanic membrane is not erythematous or bulging.     Nose: Nose normal.     Right Sinus: No maxillary sinus tenderness or frontal sinus tenderness.     Left Sinus: No maxillary sinus tenderness or frontal sinus tenderness.     Mouth/Throat:     Pharynx: Uvula midline. Posterior oropharyngeal erythema present. No oropharyngeal exudate.     Comments: Drainage present posterior oropharynx Cardiovascular:     Rate and Rhythm: Normal rate and regular rhythm.     Heart sounds: No murmur heard.   Pulmonary:     Effort: Pulmonary effort is normal. No accessory muscle usage or respiratory distress.     Breath sounds: No stridor. Wheezing and rhonchi present. No rales.     Comments: Scattered wheezing and rhonchi throughout lung fields. Abdominal:     General: Bowel sounds are normal.     Palpations: Abdomen is soft.     Tenderness: There is no abdominal tenderness.  Lymphadenopathy:     Head:     Right side of head: No submental, submandibular or tonsillar adenopathy.     Left side of  head: No submental, submandibular or tonsillar adenopathy.     Cervical: No cervical adenopathy.  Neurological:     Mental Status: He is alert.  Psychiatric:        Behavior: Behavior is cooperative.      UC Treatments / Results  Labs (all labs ordered are listed, but only abnormal results are displayed) Labs Reviewed  POC INFLUENZA A AND B ANTIGEN (URGENT CARE ONLY) - Abnormal; Notable for the following components:      Result Value   INFLUENZA A ANTIGEN, POC POSITIVE (*)    All other components within normal limits  SARS CORONAVIRUS 2 (TAT 6-24 HRS)    EKG   Radiology DG Chest 2 View  Result Date: 02/17/2021 CLINICAL DATA:  Shortness of breath with chest tightness EXAM: CHEST - 2 VIEW COMPARISON:  January 19, 2021 FINDINGS: Lungs are clear. Heart is upper normal in size with pulmonary vascularity normal. No adenopathy. No pneumothorax. No bone lesions. IMPRESSION: Lungs clear.  Heart upper normal in size. Electronically Signed   By: Bretta Bang III M.D.   On: 02/17/2021 13:18    Procedures Procedures (including critical care time)  Medications Ordered in UC Medications  albuterol (VENTOLIN HFA) 108 (90 Base) MCG/ACT inhaler 2 puff (2 puffs Inhalation Given 02/17/21 1310)    Initial Impression / Assessment and Plan / UC Course  I have reviewed the triage vital signs and the nursing notes.  Pertinent labs & imaging results that were available during my care of the patient were reviewed by me and considered in my medical decision making (see chart for details).     EKG obtained given chest tightness showed normal sinus rhythm with ventricular  rate of 74 bpm and no acute findings similar to 07/13/2020 tracing with the exception of nonspecific ST changes in aVL.  Patient was given albuterol in office with improvement of symptoms.  He was positive for flu A and was started on Tamiflu.  COVID test is pending.  Chest x-ray showed no acute findings.  Patient was not given any  steroids he reported previously having side effect with oral steroids causing lower extremity weakness.  He does have albuterol and Breo available at home and will continue using these medications to manage symptoms.  He was provided work excuse note.  Strict return precautions given to which patient expressed understanding.  Final Clinical Impressions(s) / UC Diagnoses   Final diagnoses:  Influenza A  Wheezing     Discharge Instructions     Your chest x-ray was normal.  You tested positive for influenza A so we are going to start Tamiflu.  Please continue your albuterol and Breo Ellipta as previously prescribed.  Rest and drink plenty of fluid.  If you have any worsening symptoms please go to the emergency room.    ED Prescriptions    Medication Sig Dispense Auth. Provider   oseltamivir (TAMIFLU) 75 MG capsule Take 1 capsule (75 mg total) by mouth every 12 (twelve) hours. 10 capsule Kentaro Alewine, Noberto Retort, PA-C     PDMP not reviewed this encounter.   Jeani Hawking, PA-C 02/17/21 1336

## 2021-03-22 ENCOUNTER — Telehealth: Payer: Self-pay | Admitting: Emergency Medicine

## 2021-03-22 DIAGNOSIS — R911 Solitary pulmonary nodule: Secondary | ICD-10-CM

## 2021-03-22 NOTE — Telephone Encounter (Signed)
Called and spoke with patient. He stated that he recently completed a CXR at his PCP's office (Scott Long PA-C with [facility]). Per patient, Lorin Picket saw a suspicious spot on his lungs and wanted him to have a CT. He wants RB to order the CT. I advised him that without a copy of that CXR report and disk, we will not be able to do anything.   I have contacted Scott's office to get a copy of the report and disk. No answered the doc line extension so I left a VM. I have also faxed a fax cover sheet to their office.   Will forward to [place] and RB to be on the look-out for the disk and report.

## 2021-03-24 NOTE — Telephone Encounter (Signed)
ATC pt LVM to return call to office. Pended order for Super D CT.

## 2021-03-24 NOTE — Telephone Encounter (Signed)
Please go ahead and order a superD CT for possible pulmonary nodule, and get him an OV w [place] to review

## 2021-03-24 NOTE — Telephone Encounter (Signed)
Pt returning phone call from office . Please advise . Pt states he will be at work by 1 pm in case there is no answe

## 2021-03-25 NOTE — Telephone Encounter (Signed)
Called and spoke with the pt and notified of response per Dr Delton Coombes  He verbalized understanding  Order placed for CT and appt was scheduled

## 2021-03-30 ENCOUNTER — Other Ambulatory Visit: Payer: Self-pay

## 2021-03-30 ENCOUNTER — Ambulatory Visit (INDEPENDENT_AMBULATORY_CARE_PROVIDER_SITE_OTHER)
Admission: RE | Admit: 2021-03-30 | Discharge: 2021-03-30 | Disposition: A | Discharge status: Home / Self Care | Payer: No Typology Code available for payment source | Source: Ambulatory Visit | Attending: Pulmonary Disease | Admitting: Pulmonary Disease

## 2021-03-30 DIAGNOSIS — R911 Solitary pulmonary nodule: Secondary | ICD-10-CM

## 2021-04-08 ENCOUNTER — Encounter: Payer: Self-pay | Admitting: Emergency Medicine

## 2021-04-08 ENCOUNTER — Other Ambulatory Visit: Payer: Self-pay

## 2021-04-08 ENCOUNTER — Ambulatory Visit (INDEPENDENT_AMBULATORY_CARE_PROVIDER_SITE_OTHER): Payer: No Typology Code available for payment source | Admitting: Emergency Medicine

## 2021-04-08 VITALS — BP 148/72 | HR 91 | Temp 98.3°F | Ht 67.0 in | Wt 248.2 lb

## 2021-04-08 DIAGNOSIS — J4520 Mild intermittent asthma, uncomplicated: Secondary | ICD-10-CM

## 2021-04-08 DIAGNOSIS — R9389 Abnormal findings on diagnostic imaging of other specified body structures: Secondary | ICD-10-CM

## 2021-04-08 DIAGNOSIS — R918 Other nonspecific abnormal finding of lung field: Secondary | ICD-10-CM

## 2021-04-08 NOTE — Progress Notes (Signed)
Subjective:    Patient ID: Andre Turner, male    DOB: 05/26/1961, 60 y.o.   MRN: [id number]  HPI 60 year old former smoker (3 pack years) with a history of rheumatoid arthritis, hypertension, allergic rhinitis, GERD, RLS.  He carries a history of possible asthma, has never really been worked up.   ROV 04/08/21 --60 year old gentleman with history of former tobacco and asthma, mild mixed disease noted on pulmonary function testing.  Past medical history also significant for hypertension, RA, GERD, rhinitis.[name]  He remains on ACE inhibitor.  His maintenance therapy is with Breo.  Suspect component of restriction due to obesity.  He had flaring symptoms in April, was improved when I saw him 02/09/2021 and planning for knee surgery - didn't get done due to insurance conflict. He was on breo, stopped it about 1 month ago after he developed bronchitis sx as below. He wasn't sure that it helped him very much. Has albuterol available.   It looks like he was diagnosed with influenza A 02/17/2021.  A chest x-ray done in urgent care 02/17/2021 reviewed by me was clear. He was treated for a bronchitis a few weeks later, had another CXR performed by his PCP that raised question of a possible abnormality, so we ordered CT chest to better eval as below:  CT scan of chest 03/31/2021 reviewed by me, shows some subtle groundglass change in the left upper lobe, peripheral right middle lobe of unclear significance.  Also 2 small pulmonary nodules less than 4 mm  Review of Systems As per HPI     Objective:   Physical Exam Vitals:   04/08/21 1043  BP: (!) 148/72  Pulse: 91  Temp: 98.3 F (36.8 C)  TempSrc: Temporal  SpO2: 97%  Weight: 248 lb 3.2 oz (112.6 kg)  Height: 5\' 7"  (1.702 m)   Gen: Pleasant, obese man, in no distress,  normal affect, freq throat clearing  ENT: No lesions,  mouth clear,  oropharynx clear, no postnasal drip  Neck: No JVD, no stridor but he does have some UA noise on a  forced exp  Lungs: No use of accessory muscles, small breaths, no crackles or wheezing on normal respiration, no wheeze on forced expiration  Cardiovascular: RRR, heart sounds normal, no murmur or gallops  Musculoskeletal: No deformities, no cyanosis or clubbing  Neuro: alert, awake, non focal  Skin: Warm, no lesions or rash      Assessment & Plan:  Mild intermittent asthma Mild if present based on PFT, normal airflows but possible mixed disease.  A lot of his symptoms include cough and upper airway irritation.  Discussed possible change lisinopril to ARB with him today.  He wants to check with Dr. .  I am happy to do that for him.  He did not get much benefit from [facility] and I think we can stop it.  In fact it may be an upper airway irritant itself.  He has albuterol which he can keep available to use if needed.  Abnormal CT of the chest Subtle focus of groundglass infiltrate in the left upper lobe, not really a true nodule.  Also some right middle lobe peripheral groundglass change.  He does have a history of RA which is a likely explanation.  Merits follow-up.  He also has 2 small pulmonary nodules.  He is moderate risk patient given his family history.  We will plan to repeat his CT chest in 1 year to look for interval stability.  Time spent 40  minutes reviewing CT chest, symptoms, medication changes.  Levy Pupa, MD, PhD 04/08/2021, 11:14 AM [facility] [phone] or if no answer [phone]

## 2021-04-08 NOTE — Assessment & Plan Note (Signed)
Mild if present based on PFT, normal airflows but possible mixed disease.  A lot of his symptoms include cough and upper airway irritation.  Discussed possible change lisinopril to ARB with him today.  He wants to check with Dr. Jens Som.  I am happy to do that for him.  He did not get much benefit from [facility] and I think we can stop it.  In fact it may be an upper airway irritant itself.  He has albuterol which he can keep available to use if needed.

## 2021-04-08 NOTE — Patient Instructions (Addendum)
We will plan to repeat your CT scan of the chest without contrast in June 2023 Agree with stopping Breo. Keep your albuterol available use 2 puffs if needed for shortness of breath, chest tightness, wheezing We will talk to Dr. Jens Som about possibly changing your lisinopril to a similar alternative to help with your throat irritation and cough. Follow with Dr Delton Coombes in 6 months or sooner if you have any problems

## 2021-04-08 NOTE — Assessment & Plan Note (Addendum)
Subtle focus of groundglass infiltrate in the left upper lobe, not really a true nodule.  Also some right middle lobe peripheral groundglass change.  He does have a history of RA which is a likely explanation.  Merits follow-up.  He also has 2 small pulmonary nodules.  He is moderate risk patient given his family history.  We will plan to repeat his CT chest in 1 year to look for interval stability.

## 2021-04-09 ENCOUNTER — Telehealth: Payer: Self-pay | Admitting: *Deleted

## 2021-04-09 MED ORDER — LOSARTAN POTASSIUM 50 MG PO TABS: 50.0000 mg | ORAL_TABLET | Freq: Every day | ORAL | 3 refills | Status: DC

## 2021-04-09 NOTE — Telephone Encounter (Signed)
Left message for patient of medication change.

## 2021-04-09 NOTE — Telephone Encounter (Signed)
-----   Message from Lewayne Bunting, MD sent at 04/09/2021  6:39 AM EDT ----- Regarding: RE: common pt No problem from my standpoint; I will ask my nurse to make change from lisinopril to losartan 50 mg daily. Olga Millers  ----- Message ----- From: Leslye Peer, MD Sent: 04/08/2021  11:08 AM EDT To: Lewayne Bunting, MD Subject: common pt                                      Hi Arlys John -  I saw Excell Seltzer this am, told him I would ask you about possible change from lisinopril to an ARB because he is dealing with throat irritation and cough.  If you think it's ok I'm happy to order.   Thanks, Rob

## 2021-05-07 ENCOUNTER — Telehealth: Payer: Self-pay | Admitting: Emergency Medicine

## 2021-05-07 NOTE — Telephone Encounter (Signed)
Pt stated that it is in regards to his blood pressure medicine he stated he wanted to tell Dr. Delton Coombes thank you so much because it has helped his breathing tremendously.  Pls regard; [phone]

## 2021-05-07 NOTE — Telephone Encounter (Signed)
ATC patient to let him know that we got the message to send to Dr. Delton Coombes. Will forward to Dr. Delton Coombes. Nothing further needed at this thim.    Dr. Delton Coombes per patient:  Pt stated that it is in regards to his blood pressure medicine he stated he wanted to tell Dr. Delton Coombes thank you so much because it has helped his breathing tremendously.

## 2021-05-10 NOTE — Telephone Encounter (Signed)
Thank you very much 

## 2021-05-14 ENCOUNTER — Emergency Department (HOSPITAL_BASED_OUTPATIENT_CLINIC_OR_DEPARTMENT_OTHER)
Admission: EM | Admit: 2021-05-14 | Discharge: 2021-05-15 | Disposition: A | Discharge status: Home / Self Care | Payer: No Typology Code available for payment source | Attending: Emergency Medicine | Admitting: Emergency Medicine

## 2021-05-14 ENCOUNTER — Encounter (HOSPITAL_BASED_OUTPATIENT_CLINIC_OR_DEPARTMENT_OTHER): Payer: Self-pay

## 2021-05-14 ENCOUNTER — Emergency Department (HOSPITAL_BASED_OUTPATIENT_CLINIC_OR_DEPARTMENT_OTHER): Payer: No Typology Code available for payment source | Admitting: Radiology

## 2021-05-14 DIAGNOSIS — Z91040 Latex allergy status: Secondary | ICD-10-CM | POA: Insufficient documentation

## 2021-05-14 DIAGNOSIS — J4520 Mild intermittent asthma, uncomplicated: Secondary | ICD-10-CM | POA: Insufficient documentation

## 2021-05-14 DIAGNOSIS — I10 Essential (primary) hypertension: Secondary | ICD-10-CM | POA: Diagnosis not present

## 2021-05-14 DIAGNOSIS — S8991XA Unspecified injury of right lower leg, initial encounter: Secondary | ICD-10-CM | POA: Diagnosis present

## 2021-05-14 DIAGNOSIS — S8002XA Contusion of left knee, initial encounter: Secondary | ICD-10-CM | POA: Diagnosis not present

## 2021-05-14 DIAGNOSIS — Y92410 Unspecified street and highway as the place of occurrence of the external cause: Secondary | ICD-10-CM | POA: Diagnosis not present

## 2021-05-14 DIAGNOSIS — S8001XA Contusion of right knee, initial encounter: Secondary | ICD-10-CM | POA: Diagnosis not present

## 2021-05-14 DIAGNOSIS — Z87891 Personal history of nicotine dependence: Secondary | ICD-10-CM | POA: Insufficient documentation

## 2021-05-14 DIAGNOSIS — S8000XA Contusion of unspecified knee, initial encounter: Secondary | ICD-10-CM

## 2021-05-14 DIAGNOSIS — Z79899 Other long term (current) drug therapy: Secondary | ICD-10-CM | POA: Insufficient documentation
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
## 2021-05-15 MED ORDER — TRAMADOL HCL 50 MG PO TABS: 50.0000 mg | ORAL_TABLET | Freq: Four times a day (QID) | ORAL | 0 refills | Status: DC | PRN
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
## 2021-05-15 NOTE — ED Notes (Signed)
Pt verbalizes understanding of discharge instructions. Opportunity for questioning and answers were provided. Armand removed by staff, pt discharged from ED to home. Educated to f/u with Ortho 
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
## 2021-11-22 ENCOUNTER — Telehealth: Payer: Self-pay | Admitting: *Deleted

## 2021-11-22 ENCOUNTER — Encounter: Payer: Self-pay | Admitting: Cardiology

## 2021-11-23 NOTE — Telephone Encounter (Signed)
Patient already has a visit with Joni Reining, NP, for pre-op evaluation scheduled for 12/10/2021. Will clearance form to Samara Deist so that she is aware and remove from pre-op pool.  Corrin Parker, PA-C 11/23/2021 1:19 PM

## 2021-11-23 NOTE — Telephone Encounter (Signed)
° °  Pre-operative Risk Assessment    Patient Name: Andre Turner  DOB: 07-Aug-1961 MRN: [id number]      Request for Surgical Clearance    Procedure:   left total knee revision  Date of Surgery:  Clearance 01/26/22                                 Surgeon:  Dr Linus Orn  Surgeon's Group or Practice Name:  [facility] Phone number:  [phone] Fax number:  [phone]   Type of Clearance Requested:   - Medical    Type of Anesthesia:  Spinal   Additional requests/questions:  Please advise surgeon/provider what medications should be held.  Cala Bradford   11/23/2021, 11:32 AM

## 2021-11-24 ENCOUNTER — Telehealth: Payer: Self-pay | Admitting: Emergency Medicine

## 2021-11-24 NOTE — Telephone Encounter (Signed)
Needs to be seen by RB or APP

## 2021-11-24 NOTE — Telephone Encounter (Signed)
Fax received [facility]eth [facility] to perform a Left total knee revision on patient.  Patient needs surgery clearance. Patient was seen on 04/08/2021. Office protocol is a risk assessment can be sent to surgeon if patient has been seen in 60 days or less.   Sending to Dr. Delton CoombesByrum for risk assessment or recommendations if patient needs to be seen in office prior to surgical procedure.

## 2021-11-25 NOTE — Telephone Encounter (Signed)
ATC patient--no answer with no option leave vm.  Will call back.

## 2021-11-26 NOTE — Telephone Encounter (Signed)
Patient has been scheduled for OV with Byrum on 12/29/21

## 2021-12-09 NOTE — Progress Notes (Signed)
Cardiology Clinic Note   Patient Name: Andre Turner Date of Encounter: 12/10/2021  Primary Care Provider:  Shanon Rosser, PA-C Primary Cardiologist:  Kirk Ruths, MD  Patient Profile    Andre Turner is a 61 year old male patient with known history of CAD, with cardiac catheterization in October 2012 revealing normal LV function and a 20% right coronary artery stenosis.  Follow-up echocardiogram September 2021 revealing normal LV function and no significant valvular disease.  Coronary CTA September 2021 revealed calcium score of 54 with minimal nonobstructive CAD.    Last seen by Dr. Stanford Breed on 01/28/2021 with complaints of worsening shortness of breath but not with routine activities.  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  He is followed by Dr. Lamonte Sakai pulmonology for ongoing management.  Past Medical History    Past Medical History:  Diagnosis Date   Allergic rhinitis    Anemia    Arthritis    rhuematoid and osteo arthritis   Chronic headache    migraines   Diverticulitis    GERD (gastroesophageal reflux disease)    History of benign prostatic hypertrophy    Hyperlipidemia    Hypertension    Restless leg    Tricuspid regurgitation    Unspecified asthma(493.90)    Past Surgical History:  Procedure Laterality Date   INGUINAL HERNIA REPAIR     KNEE ARTHROSCOPY     PATELLA-FEMORAL ARTHROPLASTY Left 07/14/2016   Procedure: LEFT KNEE PATELLA-FEMORAL  REPLACEMENT ARTHROPLASTY;  Surgeon: Ninetta Lights, MD;  Location: [place];  Service: Orthopedics;  Laterality: Left;   TENDON REPAIR     TONSILLECTOMY      Allergies  Allergies  Allergen Reactions   Lisinopril-Hydrochlorothiazide Shortness Of Breath   Metoprolol Anaphylaxis   Celebrex [Celecoxib] Itching   Percocet [Oxycodone-Acetaminophen]    Sulfa Antibiotics Nausea And Vomiting and Itching   Sulfonamide Derivatives Hives     REACTION: causes nausea , itchy   Tetracycline Nausea Only   Latex Rash    History of Present Illness    Andre Turner is a pleasant male who comes today for preoperative evaluation for left total knee revision by Dr. Judeth Cornfield  at [place] [facility] [place] at [facility] with Spinal Anesthesia.  Surgery is planned for January 26, 2022.  Has known history of CAD with with coronary CTA in September 2021 with calcium score of 54 revealing minimal nonobstructive CAD.  Since being seen last she was taken off of lisinopril as this was causing issues with his breathing status and changed to losartan which greatly improved his breathing status and allowed him to be more active.  He was also placed on metoprolol temporarily which caused his breathing to worsen as well and he was taken off of this.  Both have been added to his allergy list.  He works at [employer], and remains active while there.  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  He denies any chest pain, shortness of breath, dizziness, or fatigue.  He is limited by his left knee causing him to have some pain, with swelling if he is on his feet too long, also some feelings of instability and popping.  Home Medications    Current Outpatient Medications  Medication Sig Dispense Refill   albuterol (PROAIR HFA) 108 (90 Base) MCG/ACT inhaler ProAir HFA 90 mcg/actuation aerosol  inhaler  INHALE 1 TO 2 PUFFS EVERY 4 TO 6 HOURS AS NEEDED FOR WHEEZING 8 g 0   allopurinol (ZYLOPRIM) 100 MG tablet Take 100 mg by mouth daily. Currently taking 300 mg     B Complex-C (B-COMPLEX WITH VITAMIN C) tablet Take 1 tablet by mouth daily.     diphenoxylate-atropine (LOMOTIL) 2.5-0.025 MG tablet Lomotil 2.5 mg-0.025 mg tablet  take 2 po qid prn diarrhea     furosemide (LASIX) 20 MG tablet Take 1 tablet (20 mg total) by mouth every other day. 45 tablet 3    gabapentin (NEURONTIN) 100 MG capsule Take 100 mg by mouth 3 (three) times daily as needed.     hydrOXYzine (ATARAX/VISTARIL) 25 MG tablet Take 1 tablet by mouth 2 (two) times daily.     Multiple Minerals-Vitamins (CALCIUM-MAGNESIUM-ZINC-D3 PO) Take 1 tablet by mouth daily.     Potassium 99 MG TABS Take 1 tablet by mouth every other day.     rosuvastatin (CRESTOR) 20 MG tablet Take 10 mg by mouth daily.      Specialty Vitamins Products (PROSTATE PO) Take 1 tablet by mouth daily.     UNABLE TO FIND Take 2 tablets by mouth daily. Med Name: [name].     losartan (COZAAR) 50 MG tablet Take 1 tablet (50 mg total) by mouth daily. 90 tablet 3   No current facility-administered medications for this visit.     Family History    Family History  Problem Relation Age of Onset   Prostate cancer Father    Hypertension Father    Heart failure Father    Leukemia Father    Lung cancer Mother    Obesity Mother    Colon cancer Maternal Grandmother    Diabetes Other        FH- maternal side   Prostate cancer Other        FH-maternal side   Esophageal cancer Neg Hx    Rectal cancer Neg Hx    Stomach cancer Neg Hx    He indicated that his mother is deceased. He indicated that his father is deceased. He indicated that his sister is alive. He indicated that his maternal grandmother is deceased. He indicated that his maternal grandfather is deceased. He indicated that his paternal grandmother is deceased. He indicated that his paternal grandfather is deceased. He indicated that the status of his neg hx is unknown.  Social History    Social History   Socioeconomic History   Marital status: Single    Spouse name: Not on file   Number of children: Not on file   Years of education: Not on file   Highest education level: Not on file  Occupational History   Not on file  Tobacco Use   Smoking status: Former    Packs/day: 3.00    Years: 1.00    Pack years: 3.00    Types: Cigarettes   Smokeless  tobacco: Never   Tobacco comments:    stopped at age 56  Vaping Use   Vaping Use: Never used  Substance and Sexual Activity   Alcohol use: Yes    Comment: 2 drinks per day   Drug use: No   Sexual activity: Never  Other Topics Concern   Not on file  Social History Narrative   HS[employer], working on college degree      single. Was employed at [employer] until December.         Social Determinants of Health  Financial Resource Strain: Not on file  Food Insecurity: Not on file  Transportation Needs: Not on file  Physical Activity: Not on file  Stress: Not on file  Social Connections: Not on file  Intimate Partner Violence: Not on file     Review of Systems    General:  No chills, fever, night sweats or weight changes.  Cardiovascular:  No chest pain, dyspnea on exertion, edema, orthopnea, palpitations, paroxysmal nocturnal dyspnea. Dermatological: No rash, lesions/masses Respiratory: No cough, dyspnea, greatly improved with medication changes Urologic: No hematuria, dysuria Abdominal:   No nausea, vomiting, diarrhea, bright red blood per rectum, melena, or hematemesis Neurologic:  No visual changes, wkns, changes in mental status. All other systems reviewed and are otherwise negative except as noted above.     Physical Exam    VS:  BP 140/70 (BP Location: Left Arm, Patient Position: Sitting, Cuff Size: Normal)    Pulse 74    Resp 20    Ht 5\' 7"  (1.702 m)    Wt 251 lb (113.9 kg)    SpO2 94%    BMI 39.31 kg/m  , BMI Body mass index is 39.31 kg/m.     GEN: Well nourished, well developed, in no acute distress.  Central obesity HEENT: normal. Neck: Supple, no JVD, carotid bruits, or masses. Cardiac: RRR, no murmurs, rubs, or gallops. No clubbing, cyanosis, positive in his right ankle edema.  Radials/DP/PT 2+ and equal bilaterally.  Respiratory:  Respirations regular and unlabored, clear to auscultation bilaterally. GI: Soft, nontender, nondistended, BS + x 4. MS: no deformity or  atrophy. Skin: warm and dry, no rash.  Significant dandruff Neuro:  Strength and sensation are intact. Psych: Normal affect.  Accessory Clinical Findings    ECG personally reviewed by me today- NSR rate of 74 bpm. - No acute changes  Lab Results  Component Value Date   WBC 10.4 07/13/2020   HGB 12.7 (L) 07/13/2020   HCT 36.8 (L) 07/13/2020   MCV 95.8 07/13/2020   PLT 252 07/13/2020   Lab Results  Component Value Date   CREATININE 1.34 (H) 07/13/2020   BUN 20 07/13/2020   NA 136 07/13/2020   K 4.9 07/13/2020   CL 107 07/13/2020   CO2 20 (L) 07/13/2020   Lab Results  Component Value Date   ALT 40 07/13/2020   AST 71 (H) 07/13/2020   ALKPHOS 79 07/13/2020   BILITOT 0.5 07/13/2020   Lab Results  Component Value Date   CHOL 238 (H) 06/02/2011   HDL 36 (L) 06/02/2011   LDLCALC  06/02/2011     Comment:       Not calculated due to Triglyceride >400. Suggest ordering Direct LDL (Unit Code: [id number]).   Total Cholesterol/HDL Ratio:CHD Risk                        Coronary Heart Disease Risk Table                                        Men       Women          1/2 Average Risk              3.4        3.3              Average Risk  5.0        4.4           2X Average Risk              9.6        7.1           3X Average Risk             23.4       11.0 Use the calculated Patient Ratio above and the CHD Risk table  to determine the patient's CHD Risk. ATP III Classification (LDL):       < 100        mg/dL         Optimal      100 - 129     mg/dL         Near or Above Optimal      130 - 159     mg/dL         Borderline High      160 - 189     mg/dL         High       > 190        mg/dL         Very High     TRIG 437 (H) 06/02/2011   CHOLHDL 6.6 06/02/2011    No results found for: HGBA1C  Review of Prior Studies: Coronary CTA 06/24/2020 1. Coronary calcium score of 54. This was 61st percentile for age and sex matched controls.   2. Normal coronary origin  with right dominance.   3. Minimal, non-obstructive CAD in the LAD/RCA (<25%).   RECOMMENDATIONS: 1. Minimal non-obstructive CAD (0-24%). Consider non-atherosclerotic causes of chest pain. Consider preventive therapy and risk factor modification.   Eleonore Chiquito, MD  Echocardiogram 06/16/2020 1. Left ventricular ejection fraction, by estimation, is 60 to 65%. The  left ventricle has normal function. The left ventricle has no regional  wall motion abnormalities. Left ventricular diastolic parameters were  normal. Abnormal GLS -13.9%, but  software did not track the endocardium well so likely inaccurate.   2. Right ventricular systolic function is normal. The right ventricular  size is normal. There is normal pulmonary artery systolic pressure. The  estimated right ventricular systolic pressure is 99991111 mmHg.   3. The aortic valve is tricuspid. Aortic valve regurgitation is not  visualized. No aortic stenosis is present.   4. The mitral valve is normal in structure. No evidence of mitral valve  regurgitation. No evidence of mitral stenosis.   5. The inferior vena cava is normal in size with greater than 50%  respiratory variability, suggesting right atrial pressure of 3 mmHg.  Assessment & Plan   1.  Preoperative cardiology evaluation: Due for total left knee replacement January 26, 2022, by Dr. Tennis Must.  Able to complete >4.0 METS. Per Revised Cardiac Risk Index, consider. He is well optimized from a cardiac standpoint. Therefore, based on ACC/AHA guidelines, patient would be at acceptable risk for the planned procedure without further cardiovascular testing.   2.  Hypertension: Slightly elevated at this office visit on losartan 50 mg daily.  If remains elevated on follow-up visits consider increasing losartan dose to 100 mg daily.  He is also on furosemide 20 mg every other day which she should continue.  Labs are being ordered by preoperative physician for clearance.  Blood pressure  is normally lower at home.  He will need to follow-up at home  to evaluate his blood pressure as he recovers from knee surgery.  3.  Hyperlipidemia: Remains on rosuvastatin 20 mg daily.  Labs are followed by PCP.  If not completed by next follow-up visit in 6 months we will need to do so.  4.  Mild obesity: Would increase his activity once he recovers from the surgery to help with weight loss, along with dietary restrictions for overall optimization of health.  Current medicines are reviewed at length with the patient today.  I have spent  45 min's  dedicated to the care of this patient on the date of this encounter to include pre-visit review of records, assessment, management and diagnostic testing,with shared decision making.   Signed, Phill Myron. West Pugh, ANP, AACC   12/10/2021 9:18 AM    [address] [place] [address] Office [phone] Fax [phone]  Notice: This dictation was prepared with Dragon dictation along with smaller phrase technology. Any transcriptional errors that result from this process are unintentional and may not be corrected upon review.

## 2021-12-10 ENCOUNTER — Ambulatory Visit: Payer: BC Managed Care – PPO | Admitting: Adult Health

## 2021-12-10 ENCOUNTER — Encounter: Payer: Self-pay | Admitting: Adult Health

## 2021-12-10 ENCOUNTER — Other Ambulatory Visit: Payer: Self-pay

## 2021-12-10 VITALS — BP 140/70 | HR 74 | Resp 20 | Ht 67.0 in | Wt 251.0 lb

## 2021-12-10 DIAGNOSIS — E782 Mixed hyperlipidemia: Secondary | ICD-10-CM | POA: Diagnosis not present

## 2021-12-10 DIAGNOSIS — Z01818 Encounter for other preprocedural examination: Secondary | ICD-10-CM | POA: Diagnosis not present

## 2021-12-10 DIAGNOSIS — J984 Other disorders of lung: Secondary | ICD-10-CM

## 2021-12-10 DIAGNOSIS — I10 Essential (primary) hypertension: Secondary | ICD-10-CM | POA: Diagnosis not present

## 2021-12-10 NOTE — Patient Instructions (Signed)
Medication Instructions:  No Changes  *If you need a refill on your cardiac medications before your next appointment, please call your pharmacy*   Lab Work: No Labs If you have labs (blood work) drawn today and your tests are completely normal, you will receive your results only by: MyChart Message (if you have MyChart) OR A paper copy in the mail If you have any lab test that is abnormal or we need to change your treatment, we will call you to review the results.   Testing/Procedures: No Testing   Follow-Up: At [facility], you and your health needs are our priority.  As part of our continuing mission to provide you with exceptional heart care, we have created designated Provider Care Teams.  These Care Teams include your primary Cardiologist (physician) and Advanced Practice Providers (APPs -  Physician Assistants and Nurse Practitioners) who all work together to provide you with the care you need, when you need it.  We recommend signing up for the patient portal called "MyChart".  Sign up information is provided on this After Visit Summary.  MyChart is used to connect with patients for Virtual Visits (Telemedicine).  Patients are able to view lab/test results, encounter notes, upcoming appointments, etc.  Non-urgent messages can be sent to your provider as well.   To learn more about what you can do with MyChart, go to [email or web address].    Your next appointment:   6 month(s)  The format for your next appointment:   In Person  Provider:   Brian Crenshaw, MD        

## 2021-12-29 ENCOUNTER — Other Ambulatory Visit: Payer: Self-pay

## 2021-12-29 ENCOUNTER — Ambulatory Visit: Payer: BC Managed Care – PPO | Admitting: Emergency Medicine

## 2021-12-29 ENCOUNTER — Encounter: Payer: Self-pay | Admitting: Emergency Medicine

## 2021-12-29 DIAGNOSIS — J4520 Mild intermittent asthma, uncomplicated: Secondary | ICD-10-CM

## 2021-12-29 DIAGNOSIS — R9389 Abnormal findings on diagnostic imaging of other specified body structures: Secondary | ICD-10-CM | POA: Diagnosis not present

## 2021-12-29 NOTE — Assessment & Plan Note (Signed)
Overall stable with reassuring pulmonary function testing.  He does flare frequently, about 3-4 times annually but a lot of this sounds like upper airway and cough, corresponds with the allergy season.  He is not flaring currently.  He is tolerating being off maintenance therapy I do not see any clear indication to restart at this time.  Continue albuterol as needed which he uses rarely. ? ?We discussed his upcoming surgery.  He is at low risk for complications as long as he is not having flaring asthma symptoms.  We will forward this note to [facility] and to the patient's orthopedist. ?

## 2021-12-29 NOTE — Addendum Note (Signed)
Addended by: Dorisann Frames R on: 12/29/2021 03:56 PM ? ? Modules accepted: Orders ? ?

## 2021-12-29 NOTE — Patient Instructions (Addendum)
Keep albuterol available to use 2 puffs when needed for shortness of breath, chest tightness, wheezing. ?You are low risk for complications for your upcoming orthopedic surgery.  We will send a copy of our notes to your orthopedist. ?Get your repeat CT scan of the chest in June 2023 as planned. ?Follow Dr. Delton Coombes in June after your CT so we can review the results together. ?

## 2021-12-29 NOTE — Progress Notes (Signed)
? ?Subjective:  ? ? Patient ID: Andre Turner, male    DOB: 27-Mar-1961, 61 y.o.   MRN: [id number] ? ?HPI ?61 year old former smoker (3 pack years) with a history of rheumatoid arthritis, hypertension, allergic rhinitis, GERD, RLS.  He carries a history of possible asthma, has never really been worked up.  ? ?ROV 04/08/21 --61 year old gentleman with history of former tobacco and asthma, mild mixed disease noted on pulmonary function testing.  Past medical history also significant for hypertension, RA, GERD, rhinitis.Andre Turner  He remains on ACE inhibitor.  His maintenance therapy is with Breo.  Suspect component of restriction due to obesity.  He had flaring symptoms in April, was improved when I saw him 02/09/2021 and planning for knee surgery - didn't get done due to insurance conflict. He was on breo, stopped it about 1 month ago after he developed bronchitis sx as below. He wasn't sure that it helped him very much. Has albuterol available.  ? ?It looks like he was diagnosed with influenza A 02/17/2021.  A chest x-ray done in urgent care 02/17/2021 reviewed by me was clear. He was treated for a bronchitis a few weeks later, had another CXR performed by his PCP that raised question of a possible abnormality, so we ordered CT chest to better eval as below: ? ?CT scan of chest 03/31/2021 reviewed by me, shows some subtle groundglass change in the left upper lobe, peripheral right middle lobe of unclear significance.  Also 2 small pulmonary nodules less than 4 mm ? ?ROV 12/29/2021 --Andre Turner is 73 and has a history of mild mixed obstruction and restriction on pulmonary function testing consistent with asthmatic COPD.  PMH also significant for hypertension, RA, GERD, rhinitis.  He has some very subtle focal groundglass in the left upper lobe and a right middle lobe GG focus of unclear etiology, 2 small pulmonary nodules.  Plan was a repeat CT chest in June 2023. ?He reports that his breathing is stable.  ?Am cough and  the need to clear his mucous from chest, sinuses.  ?He had an acute flare with cough, was seen at [facility] - treated with abx + pred. He has 3x a year, Spring and Fall.  ?Rare albuterol use - not every day, once a month.  ?His ACE-I was changed to losartan  ? ?Review of Systems ?As per HPI ? ?   ?Objective:  ? Physical Exam ?Vitals:  ? 12/29/21 1524  ?BP: (!) 142/76  ?Pulse: 86  ?Temp: 97.9 ?F (36.6 ?C)  ?TempSrc: Oral  ?SpO2: 95%  ?Weight: 256 lb 9.6 oz (116.4 kg)  ?Height: 5\' 7"  (1.702 m)  ? ?Gen: Pleasant, obese man, in no distress,  normal affect, freq throat clearing ? ?ENT: No lesions,  mouth clear,  oropharynx clear, no postnasal drip ? ?Neck: No JVD, no stridor  ? ?Lungs: No use of accessory muscles, small breaths, no crackles or wheezing on normal respiration, no wheeze on forced expiration ? ?Cardiovascular: RRR, heart sounds normal, no murmur or gallops ? ?Musculoskeletal: No deformities, no cyanosis or clubbing ? ?Neuro: alert, awake, non focal ? ?Skin: Warm, no lesions or rash ? ? ?   ?Assessment & Plan:  ?Mild intermittent asthma ?Overall stable with reassuring pulmonary function testing.  He does flare frequently, about 3-4 times annually but a lot of this sounds like upper airway and cough, corresponds with the allergy season.  He is not flaring currently.  He is tolerating being off maintenance therapy I do not see  any clear indication to restart at this time.  Continue albuterol as needed which he uses rarely. ? ?We discussed his upcoming surgery.  He is at low risk for complications as long as he is not having flaring asthma symptoms.  We will forward this note to [facility] and to the patient's orthopedist. ? ?Abnormal CT of the chest ?Small foci of groundglass, small nodules with plans to repeat his CT chest in June 2023 for interval stability. ? ? ?Andre Pupa, MD, PhD ?12/29/2021, 3:53 PM ?[facility] ?[phone] or if no answer [phone] ? ?

## 2021-12-29 NOTE — Assessment & Plan Note (Signed)
Small foci of groundglass, small nodules with plans to repeat his CT chest in June 2023 for interval stability. ?

## 2021-12-30 NOTE — Telephone Encounter (Signed)
OV note and clearance form has been faxed back to [facility]. Nothing further needed at this time.  ?

## 2022-01-14 ENCOUNTER — Other Ambulatory Visit: Payer: Self-pay | Admitting: Physician Assistant

## 2022-01-14 ENCOUNTER — Ambulatory Visit
Admission: RE | Admit: 2022-01-14 | Discharge: 2022-01-14 | Disposition: A | Discharge status: Home / Self Care | Payer: BC Managed Care – PPO | Source: Ambulatory Visit | Attending: Physician Assistant | Admitting: Physician Assistant

## 2022-01-14 DIAGNOSIS — R791 Abnormal coagulation profile: Secondary | ICD-10-CM

## 2022-01-14 MED ORDER — IOPAMIDOL (ISOVUE-370) INJECTION 76%
75.0000 mL | Freq: Once | INTRAVENOUS | Status: AC | PRN
  Administered 2022-01-14: 75 mL via INTRAVENOUS

## 2022-01-24 HISTORY — PX: REPLACEMENT TOTAL KNEE: SUR1224

## 2022-02-10 ENCOUNTER — Telehealth: Payer: Self-pay | Admitting: Emergency Medicine

## 2022-02-11 NOTE — Telephone Encounter (Signed)
Dr. Delton CoombesByrum, please advise on this if pt still needs to have CT in June. ?

## 2022-02-14 NOTE — Telephone Encounter (Signed)
Pt aware of response per Dr Delton Coombes  ?I could not cancel the CT bc it was already scheduled  ?Sending back to [facility] ?

## 2022-02-14 NOTE — Telephone Encounter (Signed)
Let him know I think we can cancel the June CT scan. He should follow with me as planned to review the scan from April ?

## 2022-02-16 NOTE — Telephone Encounter (Signed)
Ct has been cancelled

## 2022-04-08 ENCOUNTER — Other Ambulatory Visit: Payer: BC Managed Care – PPO

## 2022-05-18 ENCOUNTER — Encounter (INDEPENDENT_AMBULATORY_CARE_PROVIDER_SITE_OTHER): Payer: Self-pay

## 2022-05-24 ENCOUNTER — Telehealth: Payer: Self-pay | Admitting: Hematology

## 2022-05-24 NOTE — Telephone Encounter (Signed)
Scheduled appt per 8/14 referral. Pt is aware of appt date and time. Pt is aware to arrive 15 mins prior to appt time and to bring and updated insurance card. Pt is aware of appt location.   

## 2022-06-07 ENCOUNTER — Telehealth: Payer: Self-pay | Admitting: Oncology

## 2022-06-07 NOTE — Telephone Encounter (Signed)
R/s pt's appt time. Pts aware of new appt time.

## 2022-06-08 NOTE — Progress Notes (Unsigned)
[facility] Cancer Initial Visit:  Patient Care Team: Lindaann Pascal, PA-C as PCP - General (Physician Assistant) Lewayne Bunting, MD as PCP - Cardiology (Cardiology) Verlin Dike., MD as Surgeon (Sports Medicine)  CHIEF COMPLAINTS/PURPOSE OF CONSULTATION:  Oncology History   No history exists.    HISTORY OF PRESENTING ILLNESS: Andre Turner 61 y.o. male is here because of leukocytosis and anemia  August 21 2020:  Colonoscopy normal    MRI 24 2023: WBC 9.6 hemoglobin 13.9 platelet count 262; 40 segs 51 lymphs 7 monos 2 eos 1 basophil. May 18, 2022 WBC 11.6 hemoglobin 13.0 MCV 93 platelet count 313; 54 segs 38 lymphs absolute lymphocyte count 4443).  PSA 0.9 hemoglobin A1c 5.7 uric acid 3.6 Ferritin 238  June 10 2022:  [facility] Hematology Consult  Patient has been taking oral iron for about a year per instruction of PCP.  Has not had upper endoscopy.  Voice has been a bit hoarse.  Patient has RA for which he takes calcium, vitamin D, magnesium and glucosamine.  Not taking steroids or biologics.  He has pain in shoulders, hips, knees, ankles from RA.  He also has gout for which he takes allopurinol with last attack was 5 weeks ago involved right foot and left big toe.    Social:  Works at [employer].  Tobacco none.  EtOH has a beer or two every other day.    Review of Systems  Constitutional:  Positive for fatigue. Negative for chills, fever and unexpected weight change.       Occasional mild night sweats  HENT:   Positive for voice change. Negative for mouth sores, sore throat and trouble swallowing.   Eyes:  Negative for eye problems and icterus.  Respiratory:  Negative for chest tightness, cough, hemoptysis and wheezing.        DOE walking up a few flights of steps  Cardiovascular:  Negative for chest pain and palpitations.       Has swelling of legs worse on standing long periods of time  Gastrointestinal:   Negative for abdominal distention, abdominal pain, blood in stool, constipation, diarrhea, nausea and vomiting.  Genitourinary:  Negative for dysuria, frequency and hematuria.        Nocturia x 2  Musculoskeletal:  Positive for arthralgias, back pain and myalgias.  Skin:  Positive for rash. Negative for itching.       Occasional rash on legs  Neurological:  Positive for numbness. Negative for headaches, seizures and speech difficulty.       Occasional numbness of fingertips  Hematological:  Negative for adenopathy. Does not bruise/bleed easily.    MEDICAL HISTORY: Past Medical History:  Diagnosis Date   Allergic rhinitis    Anemia    Arthritis    rhuematoid and osteo arthritis   Chronic headache    migraines   Diverticulitis    GERD (gastroesophageal reflux disease)    History of benign prostatic hypertrophy    Hyperlipidemia    Hypertension    Restless leg    Tricuspid regurgitation    Unspecified asthma(493.90)     SURGICAL HISTORY: Past Surgical History:  Procedure Laterality Date   INGUINAL HERNIA REPAIR     KNEE ARTHROSCOPY     PATELLA-FEMORAL ARTHROPLASTY Left 07/14/2016   Procedure: LEFT KNEE PATELLA-FEMORAL  REPLACEMENT ARTHROPLASTY;  Surgeon: Loreta Ave, MD;  Location: [facility];  Service: Orthopedics;  Laterality: Left;   TENDON REPAIR  TONSILLECTOMY      SOCIAL HISTORY: Social History   Socioeconomic History   Marital status: Single    Spouse name: Not on file   Number of children: Not on file   Years of education: Not on file   Highest education level: Not on file  Occupational History   Not on file  Tobacco Use   Smoking status: Former    Packs/day: 3.00    Years: 1.00    Total pack years: 3.00    Types: Cigarettes   Smokeless tobacco: Never   Tobacco comments:    stopped at age 32  Vaping Use   Vaping Use: Never used  Substance and Sexual Activity   Alcohol use: Yes    Comment: 2 drinks per day   Drug use: No    Sexual activity: Never  Other Topics Concern   Not on file  Social History Narrative   HSG, working on college degree      single. Was employed at [employer] until December.         Social Determinants of Health   Financial Resource Strain: Not on file  Food Insecurity: Not on file  Transportation Needs: Not on file  Physical Activity: Not on file  Stress: Not on file  Social Connections: Not on file  Intimate Partner Violence: Not on file    FAMILY HISTORY Family History  Problem Relation Age of Onset   Prostate cancer Father    Hypertension Father    Heart failure Father    Leukemia Father    Lung cancer Mother    Obesity Mother    Colon cancer Maternal Grandmother    Diabetes Other        FH- maternal side   Prostate cancer Other        FH-maternal side   Esophageal cancer Neg Hx    Rectal cancer Neg Hx    Stomach cancer Neg Hx     ALLERGIES:  is allergic to lisinopril-hydrochlorothiazide, metoprolol, celebrex [celecoxib], percocet [oxycodone-acetaminophen], sulfa antibiotics, sulfonamide derivatives, tetracycline, and latex.  MEDICATIONS:  Current Outpatient Medications  Medication Sig Dispense Refill   allopurinol (ZYLOPRIM) 100 MG tablet Take 100 mg by mouth 2 (two) times daily.     B Complex-C (B-COMPLEX WITH VITAMIN C) tablet Take 1 tablet by mouth daily.     diphenoxylate-atropine (LOMOTIL) 2.5-0.025 MG tablet Take 1 tablet by mouth daily.     furosemide (LASIX) 20 MG tablet Take 1 tablet (20 mg total) by mouth every other day. (Patient taking differently: Take 20 mg by mouth daily as needed.) 45 tablet 3   gabapentin (NEURONTIN) 100 MG capsule Take 200 mg by mouth daily as needed.     hydrOXYzine (ATARAX/VISTARIL) 25 MG tablet Take 1 tablet by mouth as needed.     losartan (COZAAR) 50 MG tablet Take 75 mg by mouth daily.     Multiple Minerals-Vitamins (CALCIUM-MAGNESIUM-ZINC-D3 PO) Take 1 tablet by mouth daily.     Potassium 99 MG TABS Take 1 tablet by mouth as  needed. For Cramping     rosuvastatin (CRESTOR) 20 MG tablet Take 10 mg by mouth daily.      Specialty Vitamins Products (PROSTATE PO) Take 1 tablet by mouth daily.     UNABLE TO FIND Take 2 tablets by mouth daily. Med Name: [name].     albuterol (PROAIR HFA) 108 (90 Base) MCG/ACT inhaler ProAir HFA 90 mcg/actuation aerosol inhaler  INHALE 1 TO 2 PUFFS EVERY 4 TO  6 HOURS AS NEEDED FOR WHEEZING 8 g 0   No current facility-administered medications for this visit.    PHYSICAL EXAMINATION:  ECOG PERFORMANCE STATUS: 0 - Asymptomatic   Vitals:   06/10/22 1303  BP: 131/81  Pulse: 79  Resp: 16  Temp: 97.7 F (36.5 C)  SpO2: 98%    Filed Weights   06/10/22 1303  Weight: 257 lb 8 oz (116.8 kg)     Physical Exam Vitals and nursing note reviewed.  Constitutional:      General: He is not in acute distress.    Appearance: Normal appearance. He is obese. He is not ill-appearing, toxic-appearing or diaphoretic.  HENT:     Head: Normocephalic and atraumatic.     Nose: No congestion or rhinorrhea.     Mouth/Throat:     Mouth: Mucous membranes are moist.     Pharynx: Oropharynx is clear. No oropharyngeal exudate or posterior oropharyngeal erythema.  Eyes:     General: No scleral icterus.    Extraocular Movements: Extraocular movements intact.     Conjunctiva/sclera: Conjunctivae normal.     Pupils: Pupils are equal, round, and reactive to light.  Cardiovascular:     Rate and Rhythm: Normal rate and regular rhythm.     Heart sounds: Normal heart sounds. No murmur heard.    No friction rub. No gallop.  Pulmonary:     Effort: Pulmonary effort is normal. No respiratory distress.     Breath sounds: Normal breath sounds. No stridor. No wheezing, rhonchi or rales.  Chest:     Chest wall: No tenderness.  Abdominal:     General: There is no distension.     Palpations: Abdomen is soft. There is no mass.     Tenderness: There is no abdominal tenderness. There is no guarding or  rebound.     Hernia: No hernia is present.  Musculoskeletal:        General: No tenderness, deformity or signs of injury.     Cervical back: No rigidity or tenderness.     Right lower leg: Edema present.     Left lower leg: Edema present.     Comments: Mild swelling of both hands  Skin:    General: Skin is warm.     Coloration: Skin is not jaundiced or pale.     Findings: No bruising, erythema or rash.  Neurological:     General: No focal deficit present.     Mental Status: He is alert and oriented to person, place, and time.     Cranial Nerves: No cranial nerve deficit.     Motor: No weakness.  Psychiatric:        Mood and Affect: Mood normal.        Behavior: Behavior normal.        Thought Content: Thought content normal.        Judgment: Judgment normal.      LABORATORY DATA: I have personally reviewed the data as listed:  No visits with results within 1 Month(s) from this visit.  Latest known visit with results is:  Admission on 02/17/2021, Discharged on 02/17/2021  Component Date Value Ref Range Status   INFLUENZA A ANTIGEN, POC 02/17/2021 POSITIVE (A)  NEGATIVE Final   INFLUENZA B ANTIGEN, POC 02/17/2021 NEGATIVE  NEGATIVE Final   SARS Coronavirus 2 02/17/2021 NEGATIVE  NEGATIVE Final   Comment: (NOTE) SARS-CoV-2 target nucleic acids are NOT DETECTED.  The SARS-CoV-2 RNA is generally detectable in upper and lower respiratory  specimens during the acute phase of infection. Negative results do not preclude SARS-CoV-2 infection, do not rule out co-infections with other pathogens, and should not be used as the sole basis for treatment or other patient management decisions. Negative results must be combined with clinical observations, patient history, and epidemiological information. The expected result is Negative.  Fact Sheet for Patients: HairSlick.no  Fact Sheet for Healthcare  Providers: quierodirigir.com  This test is not yet approved or cleared by the [place] FDA and  has been authorized for detection and/or diagnosis of SARS-CoV-2 by FDA under an Emergency Use Authorization (EUA). This EUA will remain  in effect (meaning this test can be used) for the duration of the COVID-19 declaration under Se                          ction 564(b)(1) of the Act, 21 U.S.C. section 360bbb-3(b)(1), unless the authorization is terminated or revoked sooner.  Performed at [facility], [address] [place], [place] [address]     RADIOGRAPHIC STUDIES: I have personally reviewed the radiological images as listed and agree with the findings in the report  No results found.  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  Different lymphocyte subsets (T cells, B cells, or NK cells) may be increased depending on the particular etiology.  Will begin by obtaining flow cytometry on peripheral blood to determine if this is a clonal process.  Will also obtain CBC with diff and smear for morphology  Anemia:  Patient has been on chronic iron therapy to manage.  Colonoscopy in the past was negative.  Refer to GI for consideration of EGD.      Cancer Staging  No matching staging information was found for the patient.   No problem-specific Assessment & Plan notes found for this encounter.   No orders of the defined types were placed in this encounter.   All questions were answered. The patient knows to call the clinic with any problems, questions or concerns.  This note was electronically signed.    Loni Muse, MD  06/10/2022 1:41 PM

## 2022-06-10 ENCOUNTER — Inpatient Hospital Stay: Payer: BC Managed Care – PPO | Attending: Hematology | Admitting: Oncology

## 2022-06-10 ENCOUNTER — Encounter: Payer: BC Managed Care – PPO | Admitting: Hematology

## 2022-06-10 ENCOUNTER — Inpatient Hospital Stay: Payer: BC Managed Care – PPO

## 2022-06-10 ENCOUNTER — Other Ambulatory Visit: Payer: Self-pay

## 2022-06-10 DIAGNOSIS — Z806 Family history of leukemia: Secondary | ICD-10-CM

## 2022-06-10 DIAGNOSIS — Z808 Family history of malignant neoplasm of other organs or systems: Secondary | ICD-10-CM | POA: Diagnosis not present

## 2022-06-10 DIAGNOSIS — D72820 Lymphocytosis (symptomatic): Secondary | ICD-10-CM | POA: Insufficient documentation

## 2022-06-10 DIAGNOSIS — D649 Anemia, unspecified: Secondary | ICD-10-CM | POA: Insufficient documentation

## 2022-06-10 DIAGNOSIS — Z8042 Family history of malignant neoplasm of prostate: Secondary | ICD-10-CM | POA: Diagnosis not present

## 2022-06-10 DIAGNOSIS — R5383 Other fatigue: Secondary | ICD-10-CM | POA: Insufficient documentation

## 2022-06-10 DIAGNOSIS — Z79899 Other long term (current) drug therapy: Secondary | ICD-10-CM | POA: Diagnosis not present

## 2022-06-10 DIAGNOSIS — D509 Iron deficiency anemia, unspecified: Secondary | ICD-10-CM | POA: Insufficient documentation

## 2022-06-10 DIAGNOSIS — M069 Rheumatoid arthritis, unspecified: Secondary | ICD-10-CM | POA: Insufficient documentation

## 2022-06-10 DIAGNOSIS — E669 Obesity, unspecified: Secondary | ICD-10-CM | POA: Diagnosis not present

## 2022-06-10 DIAGNOSIS — Z87891 Personal history of nicotine dependence: Secondary | ICD-10-CM | POA: Insufficient documentation

## 2022-06-10 DIAGNOSIS — D500 Iron deficiency anemia secondary to blood loss (chronic): Secondary | ICD-10-CM

## 2022-06-10 DIAGNOSIS — Z801 Family history of malignant neoplasm of trachea, bronchus and lung: Secondary | ICD-10-CM | POA: Insufficient documentation

## 2022-06-10 LAB — TECHNOLOGIST SMEAR REVIEW

## 2022-06-10 LAB — CMP (CANCER CENTER ONLY)
ALT: 29 U/L (ref 0–44)
AST: 25 U/L (ref 15–41)
Albumin: 4.5 g/dL (ref 3.5–5.0)
Alkaline Phosphatase: 82 U/L (ref 38–126)
Anion gap: 4 — ABNORMAL LOW (ref 5–15)
BUN: 19 mg/dL (ref 8–23)
CO2: 28 mmol/L (ref 22–32)
Calcium: 9.5 mg/dL (ref 8.9–10.3)
Chloride: 103 mmol/L (ref 98–111)
Creatinine: 0.95 mg/dL (ref 0.61–1.24)
GFR, Estimated: >60 mL/min (ref >60)
Glucose, Bld: 100 mg/dL — ABNORMAL HIGH (ref 70–99)
Potassium: 4.2 mmol/L (ref 3.5–5.1)
Sodium: 135 mmol/L (ref 135–145)
Total Bilirubin: 0.6 mg/dL (ref 0.3–1.2)
Total Protein: 7.2 g/dL (ref 6.5–8.1)

## 2022-06-10 LAB — VITAMIN B12: Vitamin B-12: 464 pg/mL (ref 180–914)

## 2022-06-10 LAB — FERRITIN: Ferritin: 193 ng/mL (ref 24–336)

## 2022-06-10 LAB — CBC WITH DIFFERENTIAL (CANCER CENTER ONLY)
Abs Immature Granulocytes: 0.02 10*3/uL (ref 0.00–0.07)
Basophils Absolute: 0.1 10*3/uL (ref 0.0–0.1)
Basophils Relative: 1 %
Eosinophils Absolute: 0.2 10*3/uL (ref 0.0–0.5)
Eosinophils Relative: 2 %
HCT: 39.5 % (ref 39.0–52.0)
Hemoglobin: 13.8 g/dL (ref 13.0–17.0)
Immature Granulocytes: 0 %
Lymphocytes Relative: 46 %
Lymphs Abs: 4.2 10*3/uL — ABNORMAL HIGH (ref 0.7–4.0)
MCH: 32.2 pg (ref 26.0–34.0)
MCHC: 34.9 g/dL (ref 30.0–36.0)
MCV: 92.1 fL (ref 80.0–100.0)
Monocytes Absolute: 0.7 10*3/uL (ref 0.1–1.0)
Monocytes Relative: 8 %
Neutro Abs: 3.8 10*3/uL (ref 1.7–7.7)
Neutrophils Relative %: 43 %
Platelet Count: 265 10*3/uL (ref 150–400)
RBC: 4.29 MIL/uL (ref 4.22–5.81)
RDW: 13.9 % (ref 11.5–15.5)
WBC Count: 8.9 10*3/uL (ref 4.0–10.5)
nRBC: 0 % (ref 0.0–0.2)

## 2022-06-10 LAB — FOLATE: Folate: 34.9 ng/mL (ref >5.9)

## 2022-06-14 ENCOUNTER — Telehealth: Payer: Self-pay | Admitting: Oncology

## 2022-06-14 LAB — SURGICAL PATHOLOGY

## 2022-06-14 NOTE — Telephone Encounter (Signed)
Scheduled appt per 9/1 los. Pt aware.  

## 2022-06-15 LAB — FLOW CYTOMETRY

## 2022-06-17 NOTE — Progress Notes (Unsigned)
HPI: FU CAD, hypertension and hyperlipidemia. Cardiac catheterization October 2012 revealed normal LV function and 20% right coronary artery. Echocardiogram September 2021 showed normal LV function and no significant valvular heart disease.  Coronary CTA September 2021 showed calcium score 54 and minimal nonobstructive coronary disease. 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 Chest x-ray April 2022 showed no active cardiopulmonary disease. Venous Dopplers 4/23 showed no evidence of DVT. CTA 4/23 showed no pulmonary embolus. Since last seen he denies dyspnea, chest pain, palpitations or syncope.  Chronic pedal edema right greater than left.  Current Outpatient Medications  Medication Sig Dispense Refill   albuterol (PROAIR HFA) 108 (90 Base) MCG/ACT inhaler ProAir HFA 90 mcg/actuation aerosol inhaler  INHALE 1 TO 2 PUFFS EVERY 4 TO 6 HOURS AS NEEDED FOR WHEEZING 8 g 0   allopurinol (ZYLOPRIM) 100 MG tablet Take 100 mg by mouth 2 (two) times daily.     B Complex-C (B-COMPLEX WITH VITAMIN C) tablet Take 1 tablet by mouth daily.     diphenoxylate-atropine (LOMOTIL) 2.5-0.025 MG tablet Take 1 tablet by mouth daily.     furosemide (LASIX) 20 MG tablet Take 1 tablet (20 mg total) by mouth every other day. (Patient taking differently: Take 20 mg by mouth daily as needed.) 45 tablet 3   gabapentin (NEURONTIN) 100 MG capsule Take 200 mg by mouth daily as needed.     HYDROcodone-acetaminophen (NORCO/VICODIN) 5-325 MG tablet Take 1 tablet by mouth every 6 (six) hours as needed.     hydrOXYzine (ATARAX/VISTARIL) 25 MG tablet Take 1 tablet by mouth as needed.     losartan (COZAAR) 50 MG tablet Take 75 mg by mouth daily.     Multiple Minerals-Vitamins (CALCIUM-MAGNESIUM-ZINC-D3 PO) Take 1 tablet by mouth daily.     Potassium 99 MG TABS Take 1 tablet by mouth as needed. For Cramping     rosuvastatin (CRESTOR) 20 MG tablet Take 10 mg by mouth  daily.      Specialty Vitamins Products (PROSTATE PO) Take 1 tablet by mouth daily.     UNABLE TO FIND Take 2 tablets by mouth daily. Med Name: [name].     No current facility-administered medications for this visit.     Past Medical History:  Diagnosis Date   Allergic rhinitis    Anemia    Arthritis    rhuematoid and osteo arthritis   Chronic headache    migraines   Diverticulitis    GERD (gastroesophageal reflux disease)    History of benign prostatic hypertrophy    Hyperlipidemia    Hypertension    Restless leg    Tricuspid regurgitation    Unspecified asthma(493.90)     Past Surgical History:  Procedure Laterality Date   INGUINAL HERNIA REPAIR     KNEE ARTHROSCOPY     PATELLA-FEMORAL ARTHROPLASTY Left 07/14/2016   Procedure: LEFT KNEE PATELLA-FEMORAL  REPLACEMENT ARTHROPLASTY;  Surgeon: Loreta Ave, MD;  Location: [facility];  Service: Orthopedics;  Laterality: Left;   TENDON REPAIR     TONSILLECTOMY      Social History   Socioeconomic History   Marital status: Single    Spouse name: Not on file   Number of children: Not on file   Years of education: Not on file   Highest education level: Not on file  Occupational History   Not on file  Tobacco Use   Smoking status: Former    Packs/day: 3.00  Years: 1.00    Total pack years: 3.00    Types: Cigarettes   Smokeless tobacco: Never   Tobacco comments:    stopped at age 22  Vaping Use   Vaping Use: Never used  Substance and Sexual Activity   Alcohol use: Yes    Comment: 2 drinks per day   Drug use: No   Sexual activity: Never  Other Topics Concern   Not on file  Social History Narrative   HSG, working on college degree      single. Was employed at [employer] until December.         Social Determinants of Corporate investment banker Strain: Not on file  Food Insecurity: Not on file  Transportation Needs: Not on file  Physical Activity: Not on file  Stress: Not on file   Social Connections: Not on file  Intimate Partner Violence: Not on file    Family History  Problem Relation Age of Onset   Prostate cancer Father    Hypertension Father    Heart failure Father    Leukemia Father    Lung cancer Mother    Obesity Mother    Colon cancer Maternal Grandmother    Diabetes Other        FH- maternal side   Prostate cancer Other        FH-maternal side   Esophageal cancer Neg Hx    Rectal cancer Neg Hx    Stomach cancer Neg Hx     ROS: Back pain but no fevers or chills, productive cough, hemoptysis, dysphasia, odynophagia, melena, hematochezia, dysuria, hematuria, rash, seizure activity, orthopnea, PND, pedal edema, claudication. Remaining systems are negative.  Physical Exam: Well-developed well-nourished in no acute distress.  Skin is warm and dry.  HEENT is normal.  Neck is supple.  Chest is clear to auscultation with normal expansion.  Cardiovascular exam is regular rate and rhythm.  Abdominal exam nontender or distended. No masses palpated. Extremities show 1+ RLE edema. neuro grossly intact  A/P  1 coronary artery disease-Minor on previous cardiac CTA. Continue medical therapy.   2 dyspnea -Extensive previous evaluation. Echocardiogram showed normal LV function and no obstructive coronary disease on CTA.  He is followed by Dr. Delton Coombes who feels that some of his dyspnea is likely related to restrictive disease and weight gain.     3 hypertension-BP elevated.  Increase losartan to 100 mg daily and follow.  We will add additional medications if needed.  4 hyperlipidemia-continue statin.  We will have most recent lipids forwarded to [place] from primary care.  He did not tolerate higher doses of Crestor due to myalgias.  If LDL not at goal we will add Zetia.  Olga Millers, MD

## 2022-06-20 ENCOUNTER — Encounter: Payer: Self-pay | Admitting: Cardiology

## 2022-06-20 ENCOUNTER — Ambulatory Visit: Payer: BC Managed Care – PPO | Attending: Cardiology | Admitting: Cardiology

## 2022-06-20 VITALS — BP 142/64 | HR 85 | Ht 67.0 in | Wt 266.0 lb

## 2022-06-20 DIAGNOSIS — E7800 Pure hypercholesterolemia, unspecified: Secondary | ICD-10-CM | POA: Diagnosis not present

## 2022-06-20 DIAGNOSIS — I2510 Atherosclerotic heart disease of native coronary artery without angina pectoris: Secondary | ICD-10-CM | POA: Diagnosis not present

## 2022-06-20 DIAGNOSIS — R0602 Shortness of breath: Secondary | ICD-10-CM | POA: Diagnosis not present

## 2022-06-20 DIAGNOSIS — I10 Essential (primary) hypertension: Secondary | ICD-10-CM

## 2022-06-20 MED ORDER — LOSARTAN POTASSIUM 100 MG PO TABS: 100.0000 mg | ORAL_TABLET | Freq: Every day | ORAL | 3 refills | Status: DC

## 2022-06-20 NOTE — Patient Instructions (Signed)
Medication Instructions:   INCREASE LOSARTAN TO 100 MG ONCE DAILY= 2 OF THE 50 MG TABLETS ONCE DAILY  *If you need a refill on your cardiac medications before your next appointment, please call your pharmacy*   Follow-Up: At [facility], you and your health needs are our priority.  As part of our continuing mission to provide you with exceptional heart care, we have created designated Provider Care Teams.  These Care Teams include your primary Cardiologist (physician) and Advanced Practice Providers (APPs -  Physician Assistants and Nurse Practitioners) who all work together to provide you with the care you need, when you need it.  We recommend signing up for the patient portal called "MyChart".  Sign up information is provided on this After Visit Summary.  MyChart is used to connect with patients for Virtual Visits (Telemedicine).  Patients are able to view lab/test results, encounter notes, upcoming appointments, etc.  Non-urgent messages can be sent to your provider as well.   To learn more about what you can do with MyChart, go to [email or web address].    Your next appointment:   12 month(s)  The format for your next appointment:   In Person  Provider:   Olga Millers, MD

## 2022-07-05 NOTE — Progress Notes (Signed)
[facility] Initial Visit:  Patient Care Team: Shanon Rosser, PA-C as PCP - General (Physician Assistant) Lelon Perla, MD as PCP - Cardiology (Cardiology) Avie Echevaria., MD as Surgeon (Sports Medicine) Barbee Cough, MD as Attending Physician (Hematology)  CHIEF COMPLAINTS/PURPOSE OF CONSULTATION:  Oncology History   No history exists.    HISTORY OF PRESENTING ILLNESS: Andre Turner 61 y.o. male is here because of leukocytosis and anemia  August 21 2020:  Colonoscopy normal  Mar 02 2022: WBC 9.6 hemoglobin 13.9 platelet count 262; 40 segs 51 lymphs 7 monos 2 eos 1 basophil. May 18, 2022 WBC 11.6 hemoglobin 13.0 MCV 93 platelet count 313; 54 segs 38 lymphs absolute lymphocyte count 4443).  PSA 0.9 hemoglobin A1c 5.7 uric acid 3.6 Ferritin 238  June 10 2022:  [facility] Hematology Consult  Patient has been taking oral iron for about a year per instruction of PCP.  Has not had upper endoscopy.  Voice has been a bit hoarse.  Patient has RA for which he takes calcium, vitamin D, magnesium and glucosamine.  Not taking steroids or biologics.  He has pain in shoulders, hips, knees, ankles from RA.  He also has gout for which he takes allopurinol with last attack was 5 weeks ago involved right foot and left big toe.    Social:  Works at [employer], sport and exercise.  Tobacco none.  EtOH has a beer or two every other day.  WBC 8.9 hemoglobin 13.8 platelet count 263; 43 segs 46 lymphs 8 monos 2 eosinophils absolute lymphocyte count was 4.2 variant lymphs and large platelets seen Flow cytometry showed no immunophenotypic evidence of lymphoproliferative disorder (no monoclonal B cells or immuno phenotypically abnormal T cells) CMP notable for glucose of 100 B12 464 folate 34.9 ferritin 193  July 08 2022:  Scheduled follow up for management of leukocytosis and anemia.   Reviewed results of labs with patient.  He has not yet been  contacted by GI for consideration of endoscopy to evaluate continued need for oral iron.     Review of Systems  Constitutional:  Positive for fatigue. Negative for chills, fever and unexpected weight change.       Occasional mild night sweats  HENT:   Positive for voice change. Negative for mouth sores, sore throat and trouble swallowing.   Eyes:  Negative for eye problems and icterus.  Respiratory:  Negative for chest tightness, cough, hemoptysis and wheezing.        DOE walking up a few flights of steps  Cardiovascular:  Negative for chest pain and palpitations.       Has swelling of legs worse on standing long periods of time  Gastrointestinal:  Negative for abdominal distention, abdominal pain, blood in stool, constipation, diarrhea, nausea and vomiting.  Genitourinary:  Negative for dysuria, frequency and hematuria.        Nocturia x 2  Musculoskeletal:  Positive for arthralgias, back pain and myalgias.  Skin:  Positive for rash. Negative for itching.       Occasional rash on legs  Neurological:  Positive for numbness. Negative for headaches, seizures and speech difficulty.       Occasional numbness of fingertips  Hematological:  Negative for adenopathy. Does not bruise/bleed easily.    MEDICAL HISTORY: Past Medical History:  Diagnosis Date   Allergic rhinitis    Anemia    Arthritis    rhuematoid and osteo arthritis   Chronic headache  migraines   Diverticulitis    GERD (gastroesophageal reflux disease)    History of benign prostatic hypertrophy    Hyperlipidemia    Hypertension    Restless leg    Tricuspid regurgitation    Unspecified asthma(493.90)     SURGICAL HISTORY: Past Surgical History:  Procedure Laterality Date   INGUINAL HERNIA REPAIR     KNEE ARTHROSCOPY     PATELLA-FEMORAL ARTHROPLASTY Left 07/14/2016   Procedure: LEFT KNEE PATELLA-FEMORAL  REPLACEMENT ARTHROPLASTY;  Surgeon: Ninetta Lights, MD;  Location: [facility];  Service:  Orthopedics;  Laterality: Left;   TENDON REPAIR     TONSILLECTOMY      SOCIAL HISTORY: Social History   Socioeconomic History   Marital status: Single    Spouse name: Not on file   Number of children: Not on file   Years of education: Not on file   Highest education level: Not on file  Occupational History   Not on file  Tobacco Use   Smoking status: Former    Packs/day: 3.00    Years: 1.00    Total pack years: 3.00    Types: Cigarettes   Smokeless tobacco: Never   Tobacco comments:    stopped at age 29  Vaping Use   Vaping Use: Never used  Substance and Sexual Activity   Alcohol use: Yes    Comment: 2 drinks per day   Drug use: No   Sexual activity: Never  Other Topics Concern   Not on file  Social History Narrative   [employer], working on college degree      single. Was employed at [employer] until December.         Social Determinants of Health   Financial Resource Strain: Not on file  Food Insecurity: Not on file  Transportation Needs: Not on file  Physical Activity: Not on file  Stress: Not on file  Social Connections: Not on file  Intimate Partner Violence: Not on file    FAMILY HISTORY Family History  Problem Relation Age of Onset   Prostate cancer Father    Hypertension Father    Heart failure Father    Leukemia Father    Lung cancer Mother    Obesity Mother    Colon cancer Maternal Grandmother    Diabetes Other        FH- maternal side   Prostate cancer Other        FH-maternal side   Esophageal cancer Neg Hx    Rectal cancer Neg Hx    Stomach cancer Neg Hx     ALLERGIES:  is allergic to lisinopril-hydrochlorothiazide, metoprolol, celebrex [celecoxib], percocet [oxycodone-acetaminophen], sulfa antibiotics, sulfonamide derivatives, tetracycline, and latex.  MEDICATIONS:  Current Outpatient Medications  Medication Sig Dispense Refill   albuterol (PROAIR HFA) 108 (90 Base) MCG/ACT inhaler ProAir HFA 90 mcg/actuation aerosol inhaler  INHALE 1 TO 2  PUFFS EVERY 4 TO 6 HOURS AS NEEDED FOR WHEEZING 8 g 0   allopurinol (ZYLOPRIM) 100 MG tablet Take 100 mg by mouth 2 (two) times daily.     B Complex-C (B-COMPLEX WITH VITAMIN C) tablet Take 1 tablet by mouth daily.     diphenoxylate-atropine (LOMOTIL) 2.5-0.025 MG tablet Take 1 tablet by mouth daily.     furosemide (LASIX) 20 MG tablet Take 1 tablet (20 mg total) by mouth every other day. (Patient taking differently: Take 20 mg by mouth daily as needed.) 45 tablet 3   gabapentin (NEURONTIN) 100 MG capsule Take 200  mg by mouth daily as needed.     HYDROcodone-acetaminophen (NORCO/VICODIN) 5-325 MG tablet Take 1 tablet by mouth every 6 (six) hours as needed.     hydrOXYzine (ATARAX/VISTARIL) 25 MG tablet Take 1 tablet by mouth as needed.     losartan (COZAAR) 100 MG tablet Take 1 tablet (100 mg total) by mouth daily. 90 tablet 3   Multiple Minerals-Vitamins (CALCIUM-MAGNESIUM-ZINC-D3 PO) Take 1 tablet by mouth daily.     Potassium 99 MG TABS Take 1 tablet by mouth as needed. For Cramping     rosuvastatin (CRESTOR) 20 MG tablet Take 10 mg by mouth daily.      Specialty Vitamins Products (PROSTATE PO) Take 1 tablet by mouth daily.     UNABLE TO FIND Take 2 tablets by mouth daily. Med Name: Cheryl Flash.     No current facility-administered medications for this visit.    PHYSICAL EXAMINATION:  ECOG PERFORMANCE STATUS: 0 - Asymptomatic   There were no vitals filed for this visit.   There were no vitals filed for this visit.    Physical Exam Vitals and nursing note reviewed.  Constitutional:      General: He is not in acute distress.    Appearance: Normal appearance. He is obese. He is not ill-appearing, toxic-appearing or diaphoretic.  HENT:     Head: Normocephalic and atraumatic.     Nose: No congestion or rhinorrhea.     Mouth/Throat:     Mouth: Mucous membranes are moist.     Pharynx: Oropharynx is clear. No oropharyngeal exudate or posterior oropharyngeal erythema.  Eyes:      General: No scleral icterus.    Extraocular Movements: Extraocular movements intact.     Conjunctiva/sclera: Conjunctivae normal.     Pupils: Pupils are equal, round, and reactive to light.  Cardiovascular:     Rate and Rhythm: Normal rate and regular rhythm.     Heart sounds: Normal heart sounds. No murmur heard.    No friction rub. No gallop.  Pulmonary:     Effort: Pulmonary effort is normal. No respiratory distress.     Breath sounds: Normal breath sounds. No stridor. No wheezing, rhonchi or rales.  Chest:     Chest wall: No tenderness.  Abdominal:     General: There is no distension.     Palpations: Abdomen is soft. There is no mass.     Tenderness: There is no abdominal tenderness. There is no guarding or rebound.     Hernia: No hernia is present.  Musculoskeletal:        General: No tenderness, deformity or signs of injury.     Cervical back: No rigidity or tenderness.     Right lower leg: Edema present.     Left lower leg: Edema present.     Comments: Mild swelling of both hands  Skin:    General: Skin is warm.     Coloration: Skin is not jaundiced or pale.     Findings: No bruising, erythema or rash.  Neurological:     General: No focal deficit present.     Mental Status: He is alert and oriented to person, place, and time.     Cranial Nerves: No cranial nerve deficit.     Motor: No weakness.  Psychiatric:        Mood and Affect: Mood normal.        Behavior: Behavior normal.        Thought Content: Thought content normal.  Judgment: Judgment normal.     LABORATORY DATA: I have personally reviewed the data as listed:  Clinical Support on 06/10/2022  Component Date Value Ref Range Status   WBC MORPHOLOGY 06/10/2022 VARIANT LYMPHS   Final   RBC MORPHOLOGY 06/10/2022 MORPHOLOGY UNREMARKABLE   Final   Plt Morphology 06/10/2022 LARGE PLATELETS   Final   Clinical Information 06/10/2022 lymphocytosis   Final   Performed at [facility], [place] [address], [place], [place] [address]   Flow Cytometry 06/10/2022 See Scanned report in [facility]   Final   Performed at [facility], [place] [address], [place], [place] [address]   Vitamin B-12 06/10/2022 464  180 - 914 pg/mL Final   Comment: (NOTE) This assay is not validated for testing neonatal or myeloproliferative syndrome specimens for Vitamin B12 levels. Performed at [facility], [place] [address], [place], [place] [address]    Folate 06/10/2022 34.9  >5.9 ng/mL Final   Comment: RESULT CONFIRMED BY MANUAL DILUTION Performed at [place] [address], [place], [place] [address]    Ferritin 06/10/2022 193  24 - 336 ng/mL Final   Performed at [facility], [address] [place], [place] [address]   Sodium 06/10/2022 135  135 - 145 mmol/L Final   Potassium 06/10/2022 4.2  3.5 - 5.1 mmol/L Final   Chloride 06/10/2022 103  98 - 111 mmol/L Final   CO2 06/10/2022 28  22 - 32 mmol/L Final   Glucose, Bld 06/10/2022 100 (H)  70 - 99 mg/dL Final   Glucose reference range applies only to samples taken after fasting for at least 8 hours.   BUN 06/10/2022 19  8 - 23 mg/dL Final   Creatinine 06/10/2022 0.95  0.61 - 1.24 mg/dL Final   Calcium 06/10/2022 9.5  8.9 - 10.3 mg/dL Final   Total Protein 06/10/2022 7.2  6.5 - 8.1 g/dL Final   Albumin 06/10/2022 4.5  3.5 - 5.0 g/dL Final   AST 06/10/2022 25  15 - 41 U/L Final   ALT 06/10/2022 29  0 - 44 U/L Final   Alkaline Phosphatase 06/10/2022 82  38 - 126 U/L Final   Total Bilirubin 06/10/2022 0.6  0.3 - 1.2 mg/dL Final   GFR, Estimated 06/10/2022 >60  >60 mL/min Final   Comment: (NOTE) Calculated using the CKD-EPI Creatinine Equation (2021)    Anion gap 06/10/2022 4 (L)  5 - 15 Final   Performed at [facility], [place] [address], [place], [place] [address]   WBC Count 06/10/2022 8.9  4.0 - 10.5 K/uL Final    RBC 06/10/2022 4.29  4.22 - 5.81 MIL/uL Final   Hemoglobin 06/10/2022 13.8  13.0 - 17.0 g/dL Final   HCT 06/10/2022 39.5  39.0 - 52.0 % Final   MCV 06/10/2022 92.1  80.0 - 100.0 fL Final   MCH 06/10/2022 32.2  26.0 - 34.0 pg Final   MCHC 06/10/2022 34.9  30.0 - 36.0 g/dL Final   RDW 06/10/2022 13.9  11.5 - 15.5 % Final   Platelet Count 06/10/2022 265  150 - 400 K/uL Final   nRBC 06/10/2022 0.0  0.0 - 0.2 % Final   Neutrophils Relative % 06/10/2022 43  % Final   Neutro Abs 06/10/2022 3.8  1.7 - 7.7 K/uL Final   Lymphocytes Relative 06/10/2022 46  % Final   Lymphs Abs 06/10/2022 4.2 (H)  0.7 - 4.0 K/uL  Final   Monocytes Relative 06/10/2022 8  % Final   Monocytes Absolute 06/10/2022 0.7  0.1 - 1.0 K/uL Final   Eosinophils Relative 06/10/2022 2  % Final   Eosinophils Absolute 06/10/2022 0.2  0.0 - 0.5 K/uL Final   Basophils Relative 06/10/2022 1  % Final   Basophils Absolute 06/10/2022 0.1  0.0 - 0.1 K/uL Final   WBC Morphology 06/10/2022 VARIANT LYMPHS   Final   RBC Morphology 06/10/2022 MORPHOLOGY UNREMARKABLE   Final   Smear Review 06/10/2022 LARGE PLATELETS   Final   Immature Granulocytes 06/10/2022 0  % Final   Abs Immature Granulocytes 06/10/2022 0.02  0.00 - 0.07 K/uL Final   Performed at [facility], [place] [address] [place], [place] [address]   SURGICAL PATHOLOGY 06/10/2022    Final-Edited                   Value:Surgical Pathology CASE: [id number] PATIENT: Caffie Pinto Flow Pathology Report     Clinical history: Lymphocytosis     DIAGNOSIS:  Peripheral blood, flow cytometry: -  No immunophenotypic evidence of a lymphoproliferative disorder (no monoclonal B cells or immunophenotypically abnormal T cells detected).  GATING AND PHENOTYPIC ANALYSIS:  Gated population: Flow cytometric immunophenotyping is performed using antibodies to the antigens listed in the table below. Electronic gates are placed around a cell cluster displaying  light scatter properties corresponding to: lymphocytes  Abnormal Cells in gated population: N/A  Phenotype of Abnormal Cells: N/A                         Lymphoid Antigens       Myeloid Antigens Miscellaneous CD2  tested    CD10 tested    CD11b     ND   CD45 tested CD3  tested    CD19 tested    CD11c     ND   HLA-Dr    ND CD4  tested    CD20 tested    CD13 ND   CD34 tested CD5  tested    CD22 ND   CD14 ND   CD38 tested C                         D7  tested    CD79b     ND   CD15 ND   CD138     ND CD8  tested    CD103     ND   CD16 ND   TdT  ND CD25 ND   CD200     tested    CD33 ND   CD123     ND TCRab     ND   sKappa    tested    CD64 ND   CD41 ND TCRgd     tested    sLambda   tested    CD117     ND   CD61 ND CD56 tested    cKappa    ND   MPO  ND   CD71 ND CD57 ND   cLambda   ND        [id number]      GROSS DESCRIPTION:  One lavender top tube submitted from [facility] for lymphoma testing.    Final Diagnosis performed by Tilford Pillar DO.   Electronically signed 06/14/2022 Technical and / or Professional components performed at [facility], [place] [address], [place], [place] [address].  The  above tests were developed and their performance characteristics determined by the [facility] system for the physical and immunophenotypic characterization of cell populations. They have not been cleared by the U.S. Food and Drug administration. The  FDA has determined that such clearance or approval is no                         t necessary. This test is used for clinical purposes. It should not be  regarded as investigational or for research     RADIOGRAPHIC STUDIES: I have personally reviewed the radiological images as listed and agree with the findings in the report  No results found.  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  Different lymphocyte subsets (T cells, B cells, or NK cells) may be  increased depending on the particular etiology.   June 10 2022:  Flow cytometry on peripheral blood negative for monoclonal population.  Smear for morphology showed a variant population.  ALC on that date was 23 which is minimally elevated.  Will continue to follow.     Anemia:  Patient has been on chronic iron therapy to manage.  Colonoscopy in the past was negative.  Refer to GI for consideration of EGD.      Cancer Staging  No matching staging information was found for the patient.   No problem-specific Assessment & Plan notes found for this encounter.   No orders of the defined types were placed in this encounter.   All questions were answered. The patient knows to call the clinic with any problems, questions or concerns.  This note was electronically signed.    Barbee Cough, MD  07/05/2022 10:06 AM

## 2022-07-08 ENCOUNTER — Inpatient Hospital Stay (HOSPITAL_BASED_OUTPATIENT_CLINIC_OR_DEPARTMENT_OTHER): Payer: BC Managed Care – PPO | Admitting: Oncology

## 2022-07-08 VITALS — BP 124/78 | HR 81 | Temp 98.5°F | Resp 16 | Wt 256.0 lb

## 2022-07-08 DIAGNOSIS — D500 Iron deficiency anemia secondary to blood loss (chronic): Secondary | ICD-10-CM | POA: Diagnosis not present

## 2022-07-08 DIAGNOSIS — D72820 Lymphocytosis (symptomatic): Secondary | ICD-10-CM | POA: Diagnosis not present

## 2022-07-08 NOTE — Progress Notes (Signed)
Faxed GI referral order, pt's demographics, and Dr. Jeralyn Bennett last office note to [facility] [phone]).  Fax confirmation received.

## 2022-08-19 ENCOUNTER — Ambulatory Visit: Payer: BC Managed Care – PPO | Admitting: Cardiology

## 2022-10-14 ENCOUNTER — Other Ambulatory Visit: Payer: Self-pay

## 2022-10-14 ENCOUNTER — Encounter: Payer: Self-pay | Admitting: Oncology

## 2022-10-14 ENCOUNTER — Inpatient Hospital Stay: Payer: BC Managed Care – PPO | Attending: Oncology | Admitting: Oncology

## 2022-10-14 ENCOUNTER — Inpatient Hospital Stay: Payer: BC Managed Care – PPO

## 2022-10-14 VITALS — BP 155/79 | HR 80 | Temp 98.3°F | Resp 16 | Wt 260.3 lb

## 2022-10-14 DIAGNOSIS — D500 Iron deficiency anemia secondary to blood loss (chronic): Secondary | ICD-10-CM

## 2022-10-14 DIAGNOSIS — D72820 Lymphocytosis (symptomatic): Secondary | ICD-10-CM | POA: Diagnosis not present

## 2022-10-14 DIAGNOSIS — D649 Anemia, unspecified: Secondary | ICD-10-CM | POA: Insufficient documentation

## 2022-10-14 DIAGNOSIS — M159 Polyosteoarthritis, unspecified: Secondary | ICD-10-CM | POA: Diagnosis not present

## 2022-10-14 DIAGNOSIS — Z87891 Personal history of nicotine dependence: Secondary | ICD-10-CM | POA: Insufficient documentation

## 2022-10-14 DIAGNOSIS — D72829 Elevated white blood cell count, unspecified: Secondary | ICD-10-CM | POA: Diagnosis present

## 2022-10-14 LAB — CBC WITH DIFFERENTIAL (CANCER CENTER ONLY)
Abs Immature Granulocytes: 0.06 10*3/uL (ref 0.00–0.07)
Basophils Absolute: 0.1 10*3/uL (ref 0.0–0.1)
Basophils Relative: 1 %
Eosinophils Absolute: 0.1 10*3/uL (ref 0.0–0.5)
Eosinophils Relative: 1 %
HCT: 36.3 % — ABNORMAL LOW (ref 39.0–52.0)
Hemoglobin: 12.8 g/dL — ABNORMAL LOW (ref 13.0–17.0)
Immature Granulocytes: 1 %
Lymphocytes Relative: 59 %
Lymphs Abs: 5.6 10*3/uL — ABNORMAL HIGH (ref 0.7–4.0)
MCH: 33 pg (ref 26.0–34.0)
MCHC: 35.3 g/dL (ref 30.0–36.0)
MCV: 93.6 fL (ref 80.0–100.0)
Monocytes Absolute: 0.7 10*3/uL (ref 0.1–1.0)
Monocytes Relative: 7 %
Neutro Abs: 3 10*3/uL (ref 1.7–7.7)
Neutrophils Relative %: 31 %
Platelet Count: 247 10*3/uL (ref 150–400)
RBC: 3.88 MIL/uL — ABNORMAL LOW (ref 4.22–5.81)
RDW: 13.4 % (ref 11.5–15.5)
Smear Review: NORMAL
WBC Count: 9.5 10*3/uL (ref 4.0–10.5)
nRBC: 0 % (ref 0.0–0.2)

## 2022-10-14 LAB — FERRITIN: Ferritin: 271 ng/mL (ref 24–336)

## 2022-10-14 NOTE — Progress Notes (Unsigned)
[facility] Cancer Follow up Visit:  Patient Care Team: Lindaann Pascal, PA-C as PCP - General (Physician Assistant) Lewayne Bunting, MD as PCP - Cardiology (Cardiology) Verlin Dike., MD as Surgeon (Sports Medicine) Loni Muse, MD as Attending Physician (Hematology)  CHIEF COMPLAINTS/PURPOSE OF CONSULTATION:  HISTORY OF PRESENTING ILLNESS: Andre Turner 62 y.o. male is here because of leukocytosis and anemia  August 21 2020:  Colonoscopy normal  Mar 02 2022: WBC 9.6 hemoglobin 13.9 platelet count 262; 40 segs 51 lymphs 7 monos 2 eos 1 basophil. May 18, 2022 WBC 11.6 hemoglobin 13.0 MCV 93 platelet count 313; 54 segs 38 lymphs absolute lymphocyte count 4443).  PSA 0.9 hemoglobin A1c 5.7 uric acid 3.6 Ferritin 238  June 10 2022:  [facility] Hematology Consult  Patient has been taking oral iron for about a year per instruction of PCP.  Has not had upper endoscopy.  Voice has been a bit hoarse.  Patient has RA for which he takes calcium, vitamin D, magnesium and glucosamine.  Not taking steroids or biologics.  He has pain in shoulders, hips, knees, ankles from RA.  He also has gout for which he takes allopurinol with last attack was 5 weeks ago involved right foot and left big toe.    Social:  Works at [employer].  Tobacco none.  EtOH has a beer or two every other day.  WBC 8.9 hemoglobin 13.8 platelet count 263; 43 segs 46 lymphs 8 monos 2 eosinophils absolute lymphocyte count was 4.2 variant lymphs and large platelets seen Flow cytometry showed no immunophenotypic evidence of lymphoproliferative disorder (no monoclonal B cells or immuno phenotypically abnormal T cells) CMP notable for glucose of 100 B12 464 folate 34.9 ferritin 193  July 08 2022:     Reviewed results of labs with patient.  He has not yet been contacted by GI for consideration of endoscopy to evaluate continued need for oral iron.    October 14 2022:  Scheduled follow up for management of leukocytosis and anemia.   Review of Systems  Constitutional:  Positive for fatigue. Negative for chills, fever and unexpected weight change.       Occasional mild night sweats  HENT:   Positive for voice change. Negative for mouth sores, sore throat and trouble swallowing.   Eyes:  Negative for eye problems and icterus.  Respiratory:  Negative for chest tightness, cough, hemoptysis and wheezing.        DOE walking up a few flights of steps  Cardiovascular:  Negative for chest pain and palpitations.       Has swelling of legs worse on standing long periods of time  Gastrointestinal:  Negative for abdominal distention, abdominal pain, blood in stool, constipation, diarrhea, nausea and vomiting.  Genitourinary:  Negative for dysuria, frequency and hematuria.        Nocturia x 2  Musculoskeletal:  Positive for arthralgias, back pain and myalgias.  Skin:  Positive for rash. Negative for itching.       Occasional rash on legs  Neurological:  Positive for numbness. Negative for headaches, seizures and speech difficulty.       Occasional numbness of fingertips  Hematological:  Negative for adenopathy. Does not bruise/bleed easily.    MEDICAL HISTORY: Past Medical History:  Diagnosis Date   Allergic rhinitis    Anemia    Arthritis    rhuematoid and osteo arthritis   Chronic headache    migraines  Diverticulitis    GERD (gastroesophageal reflux disease)    History of benign prostatic hypertrophy    Hyperlipidemia    Hypertension    Restless leg    Tricuspid regurgitation    Unspecified asthma(493.90)     SURGICAL HISTORY: Past Surgical History:  Procedure Laterality Date   INGUINAL HERNIA REPAIR     KNEE ARTHROSCOPY     PATELLA-FEMORAL ARTHROPLASTY Left 07/14/2016   Procedure: LEFT KNEE PATELLA-FEMORAL  REPLACEMENT ARTHROPLASTY;  Surgeon: Loreta Ave, MD;  Location:  SURGERY CENTER;  Service: Orthopedics;  Laterality:  Left;   TENDON REPAIR     TONSILLECTOMY      SOCIAL HISTORY: Social History   Socioeconomic History   Marital status: Single    Spouse name: Not on file   Number of children: Not on file   Years of education: Not on file   Highest education level: Not on file  Occupational History   Not on file  Tobacco Use   Smoking status: Former    Packs/day: 3.00    Years: 1.00    Total pack years: 3.00    Types: Cigarettes   Smokeless tobacco: Never   Tobacco comments:    stopped at age 80  Vaping Use   Vaping Use: Never used  Substance and Sexual Activity   Alcohol use: Yes    Comment: 2 drinks per day   Drug use: No   Sexual activity: Never  Other Topics Concern   Not on file  Social History Narrative   HS[employer], working on college degree      single. Was employed at [employer] until December.         Social Determinants of Health   Financial Resource Strain: Not on file  Food Insecurity: Not on file  Transportation Needs: Not on file  Physical Activity: Not on file  Stress: Not on file  Social Connections: Not on file  Intimate Partner Violence: Not on file    FAMILY HISTORY Family History  Problem Relation Age of Onset   Prostate cancer Father    Hypertension Father    Heart failure Father    Leukemia Father    Lung cancer Mother    Obesity Mother    Colon cancer Maternal Grandmother    Diabetes Other        FH- maternal side   Prostate cancer Other        FH-maternal side   Esophageal cancer Neg Hx    Rectal cancer Neg Hx    Stomach cancer Neg Hx     ALLERGIES:  is allergic to lisinopril-hydrochlorothiazide, metoprolol, celebrex [celecoxib], percocet [oxycodone-acetaminophen], sulfa antibiotics, sulfonamide derivatives, tetracycline, and latex.  MEDICATIONS:  Current Outpatient Medications  Medication Sig Dispense Refill   albuterol (PROAIR HFA) 108 (90 Base) MCG/ACT inhaler ProAir HFA 90 mcg/actuation aerosol inhaler  INHALE 1 TO 2 PUFFS EVERY 4 TO 6 HOURS  AS NEEDED FOR WHEEZING 8 g 0   allopurinol (ZYLOPRIM) 100 MG tablet Take 100 mg by mouth 2 (two) times daily.     B Complex-C (B-COMPLEX WITH VITAMIN C) tablet Take 1 tablet by mouth daily.     diphenoxylate-atropine (LOMOTIL) 2.5-0.025 MG tablet Take 1 tablet by mouth daily.     furosemide (LASIX) 20 MG tablet Take 1 tablet (20 mg total) by mouth every other day. (Patient taking differently: Take 20 mg by mouth daily as needed.) 45 tablet 3   gabapentin (NEURONTIN) 100 MG capsule Take 200 mg by mouth  daily as needed.     HYDROcodone-acetaminophen (NORCO/VICODIN) 5-325 MG tablet Take 1 tablet by mouth every 6 (six) hours as needed.     hydrOXYzine (ATARAX/VISTARIL) 25 MG tablet Take 1 tablet by mouth as needed.     losartan (COZAAR) 100 MG tablet Take 1 tablet (100 mg total) by mouth daily. 90 tablet 3   Multiple Minerals-Vitamins (CALCIUM-MAGNESIUM-ZINC-D3 PO) Take 1 tablet by mouth daily.     Potassium 99 MG TABS Take 1 tablet by mouth as needed. For Cramping     rosuvastatin (CRESTOR) 20 MG tablet Take 10 mg by mouth daily.      Specialty Vitamins Products (PROSTATE PO) Take 1 tablet by mouth daily.     UNABLE TO FIND Take 2 tablets by mouth daily. Med Name: [name].     No current facility-administered medications for this visit.    PHYSICAL EXAMINATION:  ECOG PERFORMANCE STATUS: 0 - Asymptomatic   There were no vitals filed for this visit.   There were no vitals filed for this visit.    Physical Exam Vitals and nursing note reviewed.  Constitutional:      General: He is not in acute distress.    Appearance: Normal appearance. He is obese. He is not ill-appearing, toxic-appearing or diaphoretic.  HENT:     Head: Normocephalic and atraumatic.     Nose: No congestion or rhinorrhea.     Mouth/Throat:     Mouth: Mucous membranes are moist.     Pharynx: Oropharynx is clear. No oropharyngeal exudate or posterior oropharyngeal erythema.  Eyes:     General: No scleral  icterus.    Extraocular Movements: Extraocular movements intact.     Conjunctiva/sclera: Conjunctivae normal.     Pupils: Pupils are equal, round, and reactive to light.  Cardiovascular:     Rate and Rhythm: Normal rate and regular rhythm.     Heart sounds: Normal heart sounds. No murmur heard.    No friction rub. No gallop.  Pulmonary:     Effort: Pulmonary effort is normal. No respiratory distress.     Breath sounds: Normal breath sounds. No stridor. No wheezing, rhonchi or rales.  Chest:     Chest wall: No tenderness.  Abdominal:     General: There is no distension.     Palpations: Abdomen is soft. There is no mass.     Tenderness: There is no abdominal tenderness. There is no guarding or rebound.     Hernia: No hernia is present.  Musculoskeletal:        General: No tenderness, deformity or signs of injury.     Cervical back: No rigidity or tenderness.     Right lower leg: Edema present.     Left lower leg: Edema present.     Comments: Mild swelling of both hands  Skin:    General: Skin is warm.     Coloration: Skin is not jaundiced or pale.     Findings: No bruising, erythema or rash.  Neurological:     General: No focal deficit present.     Mental Status: He is alert and oriented to person, place, and time.     Cranial Nerves: No cranial nerve deficit.     Motor: No weakness.  Psychiatric:        Mood and Affect: Mood normal.        Behavior: Behavior normal.        Thought Content: Thought content normal.  Judgment: Judgment normal.      LABORATORY DATA: I have personally reviewed the data as listed:  No visits with results within 1 Month(s) from this visit.  Latest known visit with results is:  Clinical Support on 06/10/2022  Component Date Value Ref Range Status   WBC MORPHOLOGY 06/10/2022 VARIANT LYMPHS   Final   RBC MORPHOLOGY 06/10/2022 MORPHOLOGY UNREMARKABLE   Final   Plt Morphology 06/10/2022 LARGE PLATELETS   Final   Clinical Information  06/10/2022 lymphocytosis   Final   Performed at [facility], [address], [place], [place] [address]   Flow Cytometry 06/10/2022 See Scanned report in [place] Link   Final   Performed at [facility], [address], [place], [place] [address]   Vitamin B-12 06/10/2022 464  180 - 914 pg/mL Final   Comment: (NOTE) This assay is not validated for testing neonatal or myeloproliferative syndrome specimens for Vitamin B12 levels. Performed at [facility], [address] [place], [place] [address]    Folate 06/10/2022 34.9  >5.9 ng/mL Final   Comment: RESULT CONFIRMED BY MANUAL DILUTION Performed at [facility], [address] [place], [place] [address]    Ferritin 06/10/2022 193  24 - 336 ng/mL Final   Performed at [facility], [address] [place], [place] [address]   Sodium 06/10/2022 135  135 - 145 mmol/L Final   Potassium 06/10/2022 4.2  3.5 - 5.1 mmol/L Final   Chloride 06/10/2022 103  98 - 111 mmol/L Final   CO2 06/10/2022 28  22 - 32 mmol/L Final   Glucose, Bld 06/10/2022 100 (H)  70 - 99 mg/dL Final   Glucose reference range applies only to samples taken after fasting for at least 8 hours.   BUN 06/10/2022 19  8 - 23 mg/dL Final   Creatinine 93/81/0175 0.95  0.61 - 1.24 mg/dL Final   Calcium 08/03/8526 9.5  8.9 - 10.3 mg/dL Final   Total Protein 78/24/2353 7.2  6.5 - 8.1 g/dL Final   Albumin 61/44/3154 4.5  3.5 - 5.0 g/dL Final   AST 00/86/7619 25  15 - 41 U/L Final   ALT 06/10/2022 29  0 - 44 U/L Final   Alkaline Phosphatase 06/10/2022 82  38 - 126 U/L Final   Total Bilirubin 06/10/2022 0.6  0.3 - 1.2 mg/dL Final   GFR, Estimated 06/10/2022 >60  >60 mL/min Final   Comment: (NOTE) Calculated using the CKD-EPI Creatinine Equation (2021)    Anion gap 06/10/2022 4 (L)  5 - 15 Final   Performed at [facility], [address]  [place], [place] [address]   WBC Count 06/10/2022 8.9  4.0 - 10.5 K/uL Final   RBC 06/10/2022 4.29  4.22 - 5.81 MIL/uL Final   Hemoglobin 06/10/2022 13.8  13.0 - 17.0 g/dL Final   HCT 67/09/4579 39.5  39.0 - 52.0 % Final   MCV 06/10/2022 92.1  80.0 - 100.0 fL Final   MCH 06/10/2022 32.2  26.0 - 34.0 pg Final   MCHC 06/10/2022 34.9  30.0 - 36.0 g/dL Final   RDW 99/83/3825 13.9  11.5 - 15.5 % Final   Platelet Count 06/10/2022 265  150 - 400 K/uL Final   nRBC 06/10/2022 0.0  0.0 - 0.2 % Final   Neutrophils Relative % 06/10/2022 43  % Final   Neutro Abs 06/10/2022 3.8  1.7 - 7.7 K/uL Final  Lymphocytes Relative 06/10/2022 46  % Final   Lymphs Abs 06/10/2022 4.2 (H)  0.7 - 4.0 K/uL Final   Monocytes Relative 06/10/2022 8  % Final   Monocytes Absolute 06/10/2022 0.7  0.1 - 1.0 K/uL Final   Eosinophils Relative 06/10/2022 2  % Final   Eosinophils Absolute 06/10/2022 0.2  0.0 - 0.5 K/uL Final   Basophils Relative 06/10/2022 1  % Final   Basophils Absolute 06/10/2022 0.1  0.0 - 0.1 K/uL Final   WBC Morphology 06/10/2022 VARIANT LYMPHS   Final   RBC Morphology 06/10/2022 MORPHOLOGY UNREMARKABLE   Final   Smear Review 06/10/2022 LARGE PLATELETS   Final   Immature Granulocytes 06/10/2022 0  % Final   Abs Immature Granulocytes 06/10/2022 0.02  0.00 - 0.07 K/uL Final   Performed at [facility], [place] [address] [place], [place] [address]   SURGICAL PATHOLOGY 06/10/2022    Final-Edited                   Value:Surgical Pathology CASE: [id number] PATIENT: Caffie Pinto Flow Pathology Report     Clinical history: Lymphocytosis     DIAGNOSIS:  Peripheral blood, flow cytometry: -  No immunophenotypic evidence of a lymphoproliferative disorder (no monoclonal B cells or immunophenotypically abnormal T cells detected).  GATING AND PHENOTYPIC ANALYSIS:  Gated population: Flow cytometric immunophenotyping is performed using antibodies to the antigens listed in the  table below. Electronic gates are placed around a cell cluster displaying light scatter properties corresponding to: lymphocytes  Abnormal Cells in gated population: N/A  Phenotype of Abnormal Cells: N/A                         Lymphoid Antigens       Myeloid Antigens Miscellaneous CD2  tested    CD10 tested    CD11b     ND   CD45 tested CD3  tested    CD19 tested    CD11c     ND   HLA-Dr    ND CD4  tested    CD20 tested    CD13 ND   CD34 tested CD5  tested    CD22 ND   CD14 ND   CD38 tested C                         D7  tested    CD79b     ND   CD15 ND   CD138     ND CD8  tested    CD103     ND   CD16 ND   TdT  ND CD25 ND   CD200     tested    CD33 ND   CD123     ND TCRab     ND   sKappa    tested    CD64 ND   CD41 ND TCRgd     tested    sLambda   tested    CD117     ND   CD61 ND CD56 tested    cKappa    ND   MPO  ND   CD71 ND CD57 ND   cLambda   ND        CD235aND      GROSS DESCRIPTION:  One lavender top tube submitted from [facility] for lymphoma testing.    Final Diagnosis performed by Tilford Pillar DO.   Electronically signed 06/14/2022 Technical and /  or Professional components performed at [facility], [place] [address], [place], [place] [address].  The above tests were developed and their performance characteristics determined by the [facility] system for the physical and immunophenotypic characterization of cell populations. They have not been cleared by the U.S. Food and Drug administration. The  FDA has determined that such clearance or approval is no                         t necessary. This test is used for clinical purposes. It should not be  regarded as investigational or for research     RADIOGRAPHIC STUDIES: I have personally reviewed the radiological images as listed and agree with the findings in the report  No results found.  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  Different lymphocyte subsets (T cells, B cells, or NK cells) may be increased depending on the particular etiology.   June 10 2022:  Flow cytometry on peripheral blood negative for monoclonal population.  Smear for morphology showed a variant population.  ALC on that date was 54 which is minimally elevated.  Will continue to follow.     Anemia:  Patient has been on chronic iron therapy to manage.  Colonoscopy in the past was negative.  Refer to GI for consideration of EGD.      Cancer Staging  No matching staging information was found for the patient.   No problem-specific Assessment & Plan notes found for this encounter.   No orders of the defined types were placed in this encounter.   All questions were answered. The patient knows to call the clinic with any problems, questions or concerns.  This note was electronically signed.    Barbee Cough, MD  10/14/2022 8:31 AM

## 2022-10-16 NOTE — Progress Notes (Signed)
Please schedule an office appt with me or an APP.  

## 2022-10-19 DIAGNOSIS — M1990 Unspecified osteoarthritis, unspecified site: Secondary | ICD-10-CM | POA: Insufficient documentation

## 2022-11-04 ENCOUNTER — Ambulatory Visit: Payer: BC Managed Care – PPO | Admitting: Nurse Practitioner

## 2022-11-09 ENCOUNTER — Encounter: Payer: Self-pay | Admitting: Cardiology

## 2022-11-09 ENCOUNTER — Telehealth: Payer: Self-pay | Admitting: Cardiology

## 2022-11-09 NOTE — Telephone Encounter (Signed)
*  STAT* If patient is at the pharmacy, call can be transferred to refill team.   1. Which medications need to be refilled? (please list name of each medication and dose if known) losartan (COZAAR) 100 MG tablet   2. Which pharmacy/location (including street and city if local pharmacy) is medication to be sent to?  [facility] [place], [place] AT [address]    3. Do they need a 30 day or 90 day supply? [place]

## 2022-11-10 ENCOUNTER — Telehealth: Payer: Self-pay | Admitting: Cardiology

## 2022-11-10 DIAGNOSIS — I10 Essential (primary) hypertension: Secondary | ICD-10-CM

## 2022-11-10 MED ORDER — LOSARTAN POTASSIUM 100 MG PO TABS: 100.0000 mg | ORAL_TABLET | Freq: Every day | ORAL | 2 refills | Status: DC

## 2022-11-10 NOTE — Telephone Encounter (Signed)
Returned call to patient who states that he has been taking 2 tablets of Losartan daily. Patient confirmed that he has been taking 2 of the 100mg  of losartan daily for a while now and states that his BP has been running 120's/60's. Patient states he got a refill for Losartan 100mg  daily and reports he needs this increased. Advised patient that per last OV note patient was to increase to only 100mg  total daily. Patient states that this did not work and he was told to increase to 200mg  total daily. Advised patient that I did not see that in records. Will forward to MD for review and Advice.   Patient's last BMET- was September of 2023

## 2022-11-10 NOTE — Telephone Encounter (Signed)
Pt c/o medication issue:  1. Name of Medication: losartan (COZAAR) 100 MG tablet   2. How are you currently taking this medication (dosage and times per day)? Patient states he takes 2 tablets a day  3. Are you having a reaction (difficulty breathing--STAT)?   4. What is your medication issue? Patient states last time he was here it was increase to two tablets a day.  The recent script that was sent was incorrect.  He would like a new script sent to his pharmacy at  [place], [address] AT [place].  He said he is out of the medication.

## 2022-11-10 NOTE — Telephone Encounter (Signed)
Maximum dose of losartan is 100 mg daily.  Would not treat with higher doses.  Would follow blood pressure on that dose and we will add other medications if needed.  Check potassium and renal function. [place] patient of the above- patient will reduce down to 100mg  daily of losartan. Chart is up to date. Labs ordered. Patient aware of all instructions and verbalized understanding.

## 2022-11-10 NOTE — Telephone Encounter (Signed)
Spoke to patient .  Patient has take the original dose in 45 days instead of 90 days. Patient did not realize  he should taking 100 mg dose instead of 200 mg dose ( 2 of 100 mg)  Patient thought he was taking 50 mg dose -  2 tablet that would equal 100 mg . Patient did not realize the prescription was switch from 50 mg to 100 mg.   1)Option is see pharmacy can ask for an override. 2)  pay out of pocket for 45 days of medication 3 long into [facility] or single care 4) Contact community pharmacy  for [facility] to see what the price would be.  Patient verbalized understanding.

## 2022-11-10 NOTE — Telephone Encounter (Signed)
Pt c/o medication issue:  1. Name of Medication: losartan (COZAAR) 100 MG tablet   2. How are you currently taking this medication (dosage and times per day)? 1 tablet daily  3. Are you having a reaction (difficulty breathing--STAT)? no  4. What is your medication issue? Patient states he spoke with his insurance and they said they need the prescription sent to them and they would cover it. Blue [employer] shield. Subscriber ID: [id number]

## 2022-11-15 LAB — BASIC METABOLIC PANEL
BUN/Creatinine Ratio: 24 (ref 10–24)
BUN: 29 mg/dL — ABNORMAL HIGH (ref 8–27)
CO2: 20 mmol/L (ref 20–29)
Calcium: 9.2 mg/dL (ref 8.6–10.2)
Chloride: 101 mmol/L (ref 96–106)
Creatinine, Ser: 1.2 mg/dL (ref 0.76–1.27)
Glucose: 86 mg/dL (ref 70–99)
Potassium: 4.8 mmol/L (ref 3.5–5.2)
Sodium: 138 mmol/L (ref 134–144)
eGFR: 68 mL/min/{1.73_m2} (ref >59)

## 2022-11-17 ENCOUNTER — Encounter: Payer: Self-pay | Admitting: Nurse Practitioner

## 2022-11-17 ENCOUNTER — Ambulatory Visit: Payer: BC Managed Care – PPO | Admitting: Nurse Practitioner

## 2022-11-17 VITALS — BP 128/62 | HR 85 | Ht 66.0 in | Wt 258.0 lb

## 2022-11-17 DIAGNOSIS — D649 Anemia, unspecified: Secondary | ICD-10-CM

## 2022-11-17 MED ORDER — OMEPRAZOLE 20 MG PO CPDR: 20.0000 mg | DELAYED_RELEASE_CAPSULE | Freq: Every day | ORAL | 3 refills | Status: DC

## 2022-11-17 NOTE — Patient Instructions (Signed)
We have sent the following medications to your pharmacy for you to pick up at your convenience: Omeprazole 20 mg once daily 30-60 minutes before breakfast.  You have been scheduled for an endoscopy. Please follow written instructions given to you at your visit today. If you use inhalers (even only as needed), please bring them with you on the day of your procedure.  _______________________________________________________  If your blood pressure at your visit was 140/90 or greater, please contact your primary care physician to follow up on this.  _______________________________________________________  If you are age 31 or older, your body mass index should be between 23-30. Your Body mass index is 41.64 kg/m. If this is out of the aforementioned range listed, please consider follow up with your Primary Care Provider.  If you are age 58 or younger, your body mass index should be between 19-25. Your Body mass index is 41.64 kg/m. If this is out of the aformentioned range listed, please consider follow up with your Primary Care Provider.   ________________________________________________________  The [facility] providers would like to encourage you to use [facility] to communicate with providers for non-urgent requests or questions.  Due to long hold times on the telephone, sending your provider a message by [facility] may be a faster and more efficient way to get a response.  Please allow 48 business hours for a response.  Please remember that this is for non-urgent requests.  _______________________________________________________

## 2022-11-17 NOTE — Progress Notes (Signed)
Assessment    # 62 yo male with [place] anemia, ? History of iron deficiency since he has been on oral iron for a year or so.  Recent Hgb 12.8 ( down from 13.8 in Aug 2023) with ferritin of 271. No overt GI bleeding but sometimes sees dark stool after eating beef. He is on chronic Diclofenac  Normal screening colonoscopy in Nov 2021.   # Possible GERD with hoarse voice and pyrosis after eating certain foods   Plan   Schedule for an EGD to evaluate for an occult GI bleed in setting of Diclofenac. The risks and benefits of EGD with possible biopsies were discussed with the patient who agrees to proceed. He is dependent on Diclofenac so it is reasonable to add a daily PPI until PUD can be excluded. Should also help with GERD symptoms . Start Omeprazole 20 mg daily before breakfast  HPI    Chief complaint: anemia   Andre Turner is a 61 y.o. male known to Dr.Stark with a PMH of chronic anemia, RA, gout.  See PMH / [place] for additional details. Patient is referred for evaluation of anemia.   Patient tells me that he has been anemic for 30 years. He has been on oral iron for a year. Labs in [place] show that he has been intermittently anemic through the years. Ferritin has not been low. Hematology following him for anemia and lymphocytosis and inquiring about having an EGD. He hasn't seen any blood in his stool but does have dark stools sometimes after eating beef. He sometimes gets heartburn after eating tomato based foods but Pepcid helps. He mentions a hoarse voice sometimes which interferes with his singing.    Andre Turner has been on Diclofenac daily for 6 months.   Normal screening normal colonoscopy in 2021.   Andre Turner is having knee surgery tomorrow (meniscus).    Labs:     Latest Ref Rng & Units 10/14/2022   12:10 PM 06/10/2022    2:10 PM 07/13/2020    8:50 PM  CBC  WBC 4.0 - 10.5 K/uL 9.5  8.9  10.4   Hemoglobin 13.0 - 17.0 g/dL 12.8  13.8  12.7   Hematocrit 39.0 - 52.0 % 36.3   39.5  36.8   Platelets 150 - 400 K/uL 247  265  252        Latest Ref Rng & Units 06/10/2022    2:10 PM 07/13/2020    8:50 PM 10/09/2016    7:23 PM  Hepatic Function  Total Protein 6.5 - 8.1 g/dL 7.2  7.4  7.0   Albumin 3.5 - 5.0 g/dL 4.5  4.1  3.7   AST 15 - 41 U/L 25  71  24   ALT 0 - 44 U/L 29  40  25   Alk Phosphatase 38 - 126 U/L 82  79  83   Total Bilirubin 0.3 - 1.2 mg/dL 0.6  0.5  0.8      Past Medical History:  Diagnosis Date   Allergic rhinitis    Anemia    Arthritis    rhuematoid and osteo arthritis   Chronic headache    migraines   Diverticulitis    GERD (gastroesophageal reflux disease)    History of benign prostatic hypertrophy    Hyperlipidemia    Hypertension    Restless leg    Tricuspid regurgitation    Unspecified asthma(493.90)     Past Surgical History:  Procedure Laterality Date   INGUINAL  HERNIA REPAIR     KNEE ARTHROSCOPY     PATELLA-FEMORAL ARTHROPLASTY Left 07/14/2016   Procedure: LEFT KNEE PATELLA-FEMORAL  REPLACEMENT ARTHROPLASTY;  Surgeon: Ninetta Lights, MD;  Location: [facility];  Service: Orthopedics;  Laterality: Left;   TENDON REPAIR     TONSILLECTOMY      Current Medications, Allergies, Family History and Social History were reviewed in [employer] record.     Current Outpatient Medications  Medication Sig Dispense Refill   albuterol (PROAIR HFA) 108 (90 Base) MCG/ACT inhaler ProAir HFA 90 mcg/actuation aerosol inhaler  INHALE 1 TO 2 PUFFS EVERY 4 TO 6 HOURS AS NEEDED FOR WHEEZING 8 g 0   allopurinol (ZYLOPRIM) 100 MG tablet Take 100 mg by mouth daily.     B Complex-C (B-COMPLEX WITH VITAMIN C) tablet Take 1 tablet by mouth daily.     diphenoxylate-atropine (LOMOTIL) 2.5-0.025 MG tablet Take 1 tablet by mouth daily.     HYDROcodone-acetaminophen (NORCO/VICODIN) 5-325 MG tablet Take 1 tablet by mouth every 6 (six) hours as needed.     hydrOXYzine (ATARAX/VISTARIL) 25 MG tablet Take 1 tablet  by mouth as needed.     losartan (COZAAR) 100 MG tablet Take 1 tablet (100 mg total) by mouth daily. 90 tablet 2   Multiple Minerals-Vitamins (CALCIUM-MAGNESIUM-ZINC-D3 PO) Take 1 tablet by mouth daily.     Potassium 99 MG TABS Take 1 tablet by mouth as needed. For Cramping     rosuvastatin (CRESTOR) 20 MG tablet Take 10 mg by mouth daily.      Specialty Vitamins Products (PROSTATE PO) Take 1 tablet by mouth daily.     UNABLE TO FIND Take 2 tablets by mouth daily. Med Name: Cheryl Flash.     No current facility-administered medications for this visit.    Review of Systems: No chest pain. No shortness of breath. No urinary complaints.    Physical Exam  Wt Readings from Last 3 Encounters:  11/17/22 258 lb (117 kg)  10/14/22 260 lb 4.8 oz (118.1 kg)  07/08/22 256 lb (116.1 kg)    BP 128/62   Pulse 85   Ht 5\' 6"  (1.676 m)   Wt 258 lb (117 kg)   SpO2 95%   BMI 41.64 kg/m  Constitutional:  Pleasant, generally well appearing male in no acute distress. Psychiatric: Normal mood and affect. Behavior is normal. EENT: Pupils normal.  Conjunctivae are normal. No scleral icterus. Neck supple.  Cardiovascular: Normal rate, regular rhythm.  Pulmonary/chest: Effort normal and breath sounds normal. No wheezing, rales or rhonchi. Abdominal: Soft, nondistended, nontender. Bowel sounds active throughout. There are no masses palpable. No hepatomegaly. Neurological: Alert and oriented to person place and time.   Skin: Skin is warm and dry. No rashes noted.  Tye Savoy, NP  11/17/2022, 2:36 PM  Cc:  Wanita Chamberlain, MD

## 2022-11-18 HISTORY — PX: KNEE ARTHROSCOPY: SHX127

## 2022-11-21 NOTE — Progress Notes (Signed)
Reviewed and agree with management plan. EGD for evaluation of anemia, possible IDA.   Pricilla Riffle. Fuller Plan, MD [facility]

## 2022-12-01 ENCOUNTER — Encounter: Payer: Self-pay | Admitting: *Deleted

## 2022-12-09 ENCOUNTER — Encounter: Payer: Self-pay | Admitting: Gastroenterology

## 2022-12-09 ENCOUNTER — Ambulatory Visit (AMBULATORY_SURGERY_CENTER): Payer: BC Managed Care – PPO | Admitting: Gastroenterology

## 2022-12-09 VITALS — BP 117/60 | HR 78 | Temp 98.9°F | Resp 15 | Ht 66.0 in | Wt 258.0 lb

## 2022-12-09 DIAGNOSIS — K2100 Gastro-esophageal reflux disease with esophagitis, without bleeding: Secondary | ICD-10-CM

## 2022-12-09 DIAGNOSIS — D649 Anemia, unspecified: Secondary | ICD-10-CM

## 2022-12-09 DIAGNOSIS — K319 Disease of stomach and duodenum, unspecified: Secondary | ICD-10-CM

## 2022-12-09 MED ORDER — SODIUM CHLORIDE 0.9 % IV SOLN: 500.0000 mL | Freq: Once | INTRAVENOUS | Status: DC

## 2022-12-09 NOTE — Patient Instructions (Signed)
YOU HAD AN ENDOSCOPIC PROCEDURE TODAY AT THE [facility]:   Refer to the procedure report that was given to you for any specific questions about what was found during the examination.  If the procedure report does not answer your questions, please call your gastroenterologist to clarify.  If you requested that your care partner not be given the details of your procedure findings, then the procedure report has been included in a sealed envelope for you to review at your convenience later.  YOU SHOULD EXPECT: Some feelings of bloating in the abdomen. Passage of more gas than usual.  Walking can help get rid of the air that was put into your GI tract during the procedure and reduce the bloating. If you had a lower endoscopy (such as a colonoscopy or flexible sigmoidoscopy) you may notice spotting of blood in your stool or on the toilet paper. If you underwent a bowel prep for your procedure, you may not have a normal bowel movement for a few days.  Please Note:  You might notice some irritation and congestion in your nose or some drainage.  This is from the oxygen used during your procedure.  There is no need for concern and it should clear up in a day or so.  SYMPTOMS TO REPORT IMMEDIATELY:   Following upper endoscopy (EGD)  Vomiting of blood or coffee ground material  New chest pain or pain under the shoulder blades  Painful or persistently difficult swallowing  New shortness of breath  Fever of 100F or higher  Black, tarry-looking stools  For urgent or emergent issues, a gastroenterologist can be reached at any hour by calling [phone]. Do not use MyChart messaging for urgent concerns.    DIET:  We do recommend a small meal at first, but then you may proceed to your regular diet.  Drink plenty of fluids but you should avoid alcoholic beverages for 24 hours.  ACTIVITY:  You should plan to take it easy for the rest of today and you should NOT DRIVE or use heavy machinery  until tomorrow (because of the sedation medicines used during the test).    FOLLOW UP: Our staff will call the number listed on your records the next business day following your procedure.  We will call around 7:15- 8:00 am to check on you and address any questions or concerns that you may have regarding the information given to you following your procedure. If we do not reach you, we will leave a message.     If any biopsies were taken you will be contacted by phone or by letter within the next 1-3 weeks.  Please call us at [phone] if you have not heard about the biopsies in 3 weeks.    SIGNATURES/CONFIDENTIALITY: You and/or your care partner have signed paperwork which will be entered into your electronic medical record.  These signatures attest to the fact that that the information above on your After Visit Summary has been reviewed and is understood.  Full responsibility of the confidentiality of this discharge information lies with you and/or your care-partner. 

## 2022-12-09 NOTE — Progress Notes (Signed)
See 11/17/2022 H&P, no changes

## 2022-12-09 NOTE — Progress Notes (Signed)
Sedate, gd SR's, VSS, report to RN 

## 2022-12-09 NOTE — Op Note (Signed)
[facility] Patient Name: Andre Turner Procedure Date: 12/09/2022 9:22 AM MRN: [id number] Endoscopist: Ladene Artist , MD, [id number] Age: 62 Referring MD:  Date of Birth: Dec 29, 1960 Gender: Male Account #: [id number] Procedure:                Upper GI endoscopy Indications:              Chronic anemia, suspected IDA Medicines:                Monitored Anesthesia Care Procedure:                Pre-Anesthesia Assessment:                           - Prior to the procedure, a History and Physical                            was performed, and patient medications and                            allergies were reviewed. The patient's tolerance of                            previous anesthesia was also reviewed. The risks                            and benefits of the procedure and the sedation                            options and risks were discussed with the patient.                            All questions were answered, and informed consent                            was obtained. Prior Anticoagulants: The patient has                            taken no anticoagulant or antiplatelet agents                            except for aspirin. ASA Grade Assessment: III - A                            patient with severe systemic disease. After                            reviewing the risks and benefits, the patient was                            deemed in satisfactory condition to undergo the                            procedure.  After obtaining informed consent, the endoscope was                            passed under direct vision. Throughout the                            procedure, the patient's blood pressure, pulse, and                            oxygen saturations were monitored continuously. The                            GIF [id number] was introduced through the                            mouth, and advanced to the second part of duodenum.                             The upper GI endoscopy was accomplished without                            difficulty. The patient tolerated the procedure                            well. Scope In: Scope Out: Findings:                 LA Grade B (one or more mucosal breaks greater than                            5 mm, not extending between the tops of two mucosal                            folds) esophagitis with no bleeding was found at                            the gastroesophageal junction.                           The Z-line was variable (2 cm) and was found at the                            gastroesophageal junction. Biopsies were taken with                            a cold forceps for histology.                           The exam of the esophagus was otherwise normal.                           Patchy mild inflammation characterized by erythema  and granularity was found in the entire examined                            stomach.                           The exam of the stomach was otherwise normal.                           The duodenal bulb and second portion of the                            duodenum were normal. Biopsies for histology were                            taken with a cold forceps for evaluation of celiac                            disease. Complications:            No immediate complications. Estimated Blood Loss:     Estimated blood loss was minimal. Impression:               - LA Grade B reflux esophagitis with no bleeding.                           - Z-line variable, at the gastroesophageal                            junction. Biopsied.                           - Gastritis. Biopsied.                           - Normal duodenal bulb and second portion of the                            duodenum. Biopsied. Recommendation:           - Patient has a contact number available for                            emergencies. The signs and symptoms of  potential                            delayed complications were discussed with the                            patient. Return to normal activities tomorrow.                            Written discharge instructions were provided to the                            patient.                           -  Resume previous diet.                           - Follow antireflux measures.                           - Continue present medications includinging                            omeprazole 20 mg qd.                           - Await pathology results.                           - Return to GI office in 1 month with PG or me. Ladene Artist, MD 12/09/2022 9:54:26 AM This report has been signed electronically.

## 2022-12-09 NOTE — Progress Notes (Signed)
Called to room to assist during endoscopic procedure.  Patient ID and intended procedure confirmed with present staff. Received instructions for my participation in the procedure from the performing physician.  

## 2022-12-09 NOTE — Progress Notes (Signed)
Pt. Reports he gets dizzy if he lays flat.

## 2022-12-12 ENCOUNTER — Telehealth: Payer: Self-pay | Admitting: *Deleted

## 2022-12-12 NOTE — Telephone Encounter (Signed)
  Follow up Call-     12/09/2022    9:01 AM 08/21/2020    1:05 PM  Call back number  Post procedure Call Back phone  # [phone]  Permission to leave phone message Yes Yes     Patient questions:  Do you have a fever, pain , or abdominal swelling? No. Pain Score  0 *  Have you tolerated food without any problems? Yes.    Have you been able to return to your normal activities? Yes.    Do you have any questions about your discharge instructions: Diet   No. Medications  No. Follow up visit  No.  Do you have questions or concerns about your Care? No.  Actions: * If pain score is 4 or above: No action needed, pain <4.

## 2022-12-15 ENCOUNTER — Encounter: Payer: Self-pay | Admitting: Gastroenterology

## 2023-01-09 ENCOUNTER — Ambulatory Visit: Payer: BC Managed Care – PPO | Admitting: Gastroenterology

## 2023-01-13 ENCOUNTER — Ambulatory Visit: Payer: BC Managed Care – PPO | Admitting: Physician Assistant

## 2023-02-11 ENCOUNTER — Encounter: Payer: Self-pay | Admitting: Cardiology

## 2023-02-13 ENCOUNTER — Other Ambulatory Visit: Payer: Self-pay

## 2023-02-13 MED ORDER — AMLODIPINE BESYLATE 10 MG PO TABS: 10.0000 mg | ORAL_TABLET | Freq: Every day | ORAL | 1 refills | Status: DC

## 2023-07-06 ENCOUNTER — Encounter (HOSPITAL_COMMUNITY): Payer: Self-pay

## 2023-07-06 ENCOUNTER — Ambulatory Visit (HOSPITAL_COMMUNITY)
Admission: EM | Admit: 2023-07-06 | Discharge: 2023-07-06 | Disposition: A | Discharge status: Home / Self Care | Payer: BC Managed Care – PPO | Attending: Nurse Practitioner | Admitting: Nurse Practitioner

## 2023-07-06 DIAGNOSIS — J4521 Mild intermittent asthma with (acute) exacerbation: Secondary | ICD-10-CM

## 2023-07-06 MED ORDER — IPRATROPIUM-ALBUTEROL 0.5-2.5 (3) MG/3ML IN SOLN
RESPIRATORY_TRACT | Status: AC
  Filled 2023-07-06: qty 3

## 2023-07-06 MED ORDER — IPRATROPIUM-ALBUTEROL 0.5-2.5 (3) MG/3ML IN SOLN
3.0000 mL | Freq: Once | RESPIRATORY_TRACT | Status: AC
  Administered 2023-07-06: 3 mL via RESPIRATORY_TRACT

## 2023-07-06 MED ORDER — ALBUTEROL SULFATE HFA 108 (90 BASE) MCG/ACT IN AERS: INHALATION_SPRAY | RESPIRATORY_TRACT | 0 refills | Status: DC

## 2023-07-06 MED ORDER — METHYLPREDNISOLONE ACETATE 40 MG/ML IJ SUSP
INTRAMUSCULAR | Status: AC
  Filled 2023-07-06: qty 1

## 2023-07-06 MED ORDER — PREDNISONE 20 MG PO TABS
40.0000 mg | ORAL_TABLET | Freq: Every day | ORAL | 0 refills | Status: AC
Start: 2023-07-06 — End: 2023-07-11

## 2023-07-06 MED ORDER — AZITHROMYCIN 250 MG PO TABS: ORAL_TABLET | ORAL | 0 refills | Status: DC

## 2023-07-06 MED ORDER — METHYLPREDNISOLONE ACETATE 40 MG/ML IJ SUSP
40.0000 mg | Freq: Once | INTRAMUSCULAR | Status: AC
  Administered 2023-07-06: 40 mg via INTRAMUSCULAR

## 2023-07-06 MED ORDER — METHYLPREDNISOLONE SODIUM SUCC 125 MG IJ SOLR
INTRAMUSCULAR | Status: AC
  Filled 2023-07-06: qty 2

## 2023-07-06 NOTE — Discharge Instructions (Addendum)
As we discussed, I suspect your asthma is exacerbated due to a viral upper respiratory infection.  However, I do not believe you are contagious at that any longer but I do think it is causing inflammation in your chest.  We gave you 2 breathing treatments today which helped open up your airway.  We also gave you an injection of steroid medicine to help with inflammation in your chest.  In addition, start taking the oral prednisone tomorrow morning.  You can also start the azithromycin and take as prescribed.  Start taking guaifenesin 600 mg twice daily and make sure you are drinking plenty of fluids.  Seek care in the emergency room if you develop shortness of breath, chest tightness, or wheezing that does not improve with the albuterol.

## 2023-07-06 NOTE — ED Provider Notes (Signed)
[facility]    CSN: [id number] Arrival date & time: 07/06/23  1527      History   Chief Complaint Chief Complaint  Patient presents with   Wheezing   Shortness of Breath    HPI Andre Turner is a 62 y.o. male.   Patient presents today with 1 week history of night sweats, congested cough, shortness of breath and wheezing, chest tightness, chest congestion, and decreased appetite.  Reports at first, he also had a sore throat but this is now better.  No known fevers, body aches or chills, chest pain, runny or stuffy nose, headache, ear pain, abdominal pain, nausea/vomiting, or diarrhea.  Energy levels have been stable per his report.  Has used albuterol inhaler with some improvement until today and symptoms have acutely worsened.  Denies history of diagnosed chronic lung disease, however he does report needing rescue inhaler intermittently since he had COVID-19 in 2020.    Past Medical History:  Diagnosis Date   Allergic rhinitis    Anemia    Arthritis    rhuematoid and osteo arthritis   Cataract    Chronic headache    migraines   Diverticulitis    GERD (gastroesophageal reflux disease)    History of benign prostatic hypertrophy    Hyperlipidemia    Hypertension    Restless leg    Tricuspid regurgitation    Unspecified asthma(493.90)     Patient Active Problem List   Diagnosis Date Noted   Osteoarthritis (arthritis due to wear and tear of joints) 10/19/2022   Lymphocytosis 06/10/2022   Iron deficiency anemia 06/10/2022   Abnormal CT of the chest 04/08/2021   URI (upper respiratory infection) 10/15/2020   Restrictive lung disease 08/17/2020   Shortness of breath 05/21/2020   Hypertension 07/13/2011   Hyperlipidemia 07/13/2011   Skin lesions, generalized 06/05/2011   Erectile dysfunction 06/05/2011   ALLERGIC RHINITIS 06/25/2007   Mild intermittent asthma 06/25/2007   HEADACHE, CHRONIC 06/25/2007   BENIGN PROSTATIC HYPERTROPHY, HX OF  06/25/2007    Past Surgical History:  Procedure Laterality Date   COLONOSCOPY     INGUINAL HERNIA REPAIR     KNEE ARTHROSCOPY Right 11/18/2022   PATELLA-FEMORAL ARTHROPLASTY Left 07/14/2016   Procedure: LEFT KNEE PATELLA-FEMORAL  REPLACEMENT ARTHROPLASTY;  Surgeon: Loreta Ave, MD;  Location: [facility];  Service: Orthopedics;  Laterality: Left;   REPLACEMENT TOTAL KNEE Left 01/24/2022   TENDON REPAIR     TONSILLECTOMY         Home Medications    Prior to Admission medications   Medication Sig Start Date End Date Taking? Authorizing Provider  azithromycin (ZITHROMAX) 250 MG tablet Take (2) tablets by mouth on day 1, then take (1) tablet by mouth on days 2-5. 07/06/23  Yes Valentino Nose, NP  predniSONE (DELTASONE) 20 MG tablet Take 2 tablets (40 mg total) by mouth daily with breakfast for 5 days. 07/06/23 07/11/23 Yes Valentino Nose, NP  albuterol (PROAIR HFA) 108 (90 Base) MCG/ACT inhaler ProAir HFA 90 mcg/actuation aerosol inhaler  INHALE 1 TO 2 PUFFS EVERY 4 TO 6 HOURS AS NEEDED FOR WHEEZING 07/06/23   Valentino Nose, NP  allopurinol (ZYLOPRIM) 100 MG tablet Take 100 mg by mouth daily.    [provider]  amLODipine (NORVASC) 10 MG tablet Take 1 tablet (10 mg total) by mouth daily. 02/13/23 03/15/23  Lewayne Bunting, MD  B Complex-C (B-COMPLEX WITH VITAMIN C) tablet Take 1 tablet by mouth daily.  [provider]  diclofenac (VOLTAREN) 75 MG EC tablet  10/31/22   [provider]  diphenoxylate-atropine (LOMOTIL) 2.5-0.025 MG tablet Take 1 tablet by mouth daily.    [provider]  famotidine (PEPCID) 20 MG tablet Take 1 tablet every day by oral route. Patient not taking: Reported on 12/09/2022 09/30/22   [provider]  furosemide (LASIX) 40 MG tablet  08/26/22   [provider]  gabapentin (NEURONTIN) 100 MG capsule Take by mouth. Patient not taking: Reported on 12/09/2022 01/08/22   [provider]  glucosamine-chondroitin 500-400 MG tablet Take by mouth. 03/11/21   [provider]  HYDROcodone-acetaminophen (NORCO/VICODIN) 5-325 MG tablet Take 1 tablet by mouth every 6 (six) hours as needed. Patient not taking: Reported on 12/09/2022 05/12/22   [provider]  hydrOXYzine (ATARAX/VISTARIL) 25 MG tablet Take 1 tablet by mouth as needed. Patient not taking: Reported on 12/09/2022    [provider]  Iron-FA-B Cmp-C-Biot-Probiotic (FUSION PLUS) CAPS Take 1 capsule by mouth daily. 07/25/22   [provider]  losartan (COZAAR) 100 MG tablet Take 1 tablet (100 mg total) by mouth daily. 11/10/22   Lewayne Bunting, MD  NON FORMULARY Cod liver Oil with Vit D - 1000 mg.    [provider]  NON FORMULARY Prostate Optimizer    [provider]  omeprazole (PRILOSEC) 20 MG capsule Take 1 capsule (20 mg total) by mouth daily. 11/17/22   Meredith Pel, NP  potassium gluconate (RA POTASSIUM GLUCONATE) 595 (99 K) MG TABS tablet Take by mouth. Patient not taking: Reported on 12/09/2022 07/15/22   [provider]  rosuvastatin (CRESTOR) 20 MG tablet Take 10 mg by mouth daily.     [provider]  Specialty Vitamins Products (PROSTATE PO) Take 1 tablet by mouth daily.    [provider]  UNABLE TO FIND Take 2 tablets by mouth daily. Med Name: [name].    [provider]    Family History Family History  Problem Relation Age of Onset   Colon cancer Mother    Lung cancer Mother    Obesity Mother    Prostate cancer Father    Hypertension Father    Heart failure Father    Leukemia Father    Colon cancer Maternal Grandmother    Diabetes Other        FH- maternal side   Prostate cancer Other        FH-maternal side   Esophageal cancer Neg Hx    Rectal cancer Neg Hx    Stomach cancer Neg Hx     Social History Social History   Tobacco Use   Smoking status: Former    Current packs/day: 3.00     Average packs/day: 3.0 packs/day for 1 year (3.0 ttl pk-yrs)    Types: Cigarettes   Smokeless tobacco: Never   Tobacco comments:    stopped at age 39  Vaping Use   Vaping status: Never Used  Substance Use Topics   Alcohol use: Yes    Comment: 1 drink per day   Drug use: No     Allergies   Lisinopril-hydrochlorothiazide, Meloxicam, Metoprolol, Celebrex [celecoxib], Percocet [oxycodone-acetaminophen], Sulfa antibiotics, Sulfonamide derivatives, Tetracycline, and Latex   Review of Systems Review of Systems Per HPI  Physical Exam Triage Vital Signs ED Triage Vitals  Encounter Vitals Group     BP      Systolic BP Percentile      Diastolic BP Percentile  Pulse      Resp      Temp      Temp src      SpO2      Weight      Height      Head Circumference      Peak Flow      Pain Score      Pain Loc      Pain Education      Exclude from Growth Chart    No data found.  Updated Vital Signs BP 131/79   Pulse 86   Temp 98.3 F (36.8 C) (Oral)   Resp (!) 22   SpO2 94%   Visual Acuity Right Eye Distance:   Left Eye Distance:   Bilateral Distance:    Right Eye Near:   Left Eye Near:    Bilateral Near:     Physical Exam Vitals and nursing note reviewed.  Constitutional:      General: He is not in acute distress.    Appearance: Normal appearance. He is not ill-appearing or toxic-appearing.  HENT:     Head: Normocephalic and atraumatic.     Right Ear: Tympanic membrane, ear canal and external ear normal.     Left Ear: Tympanic membrane, ear canal and external ear normal.     Nose: No congestion or rhinorrhea.     Mouth/Throat:     Mouth: Mucous membranes are moist.     Pharynx: Oropharynx is clear. Posterior oropharyngeal erythema present. No oropharyngeal exudate.  Eyes:     General: No scleral icterus.    Extraocular Movements: Extraocular movements intact.  Cardiovascular:     Rate and Rhythm: Normal rate and regular rhythm.  Pulmonary:     Effort:  Tachypnea and accessory muscle usage present.     Breath sounds: Wheezing present.     Comments: Audible wheezes Abdominal:     General: Abdomen is flat. Bowel sounds are normal. There is no distension.     Palpations: Abdomen is soft.  Musculoskeletal:     Cervical back: Normal range of motion and neck supple.  Lymphadenopathy:     Cervical: No cervical adenopathy.  Skin:    General: Skin is warm and dry.     Coloration: Skin is not jaundiced or pale.     Findings: No erythema or rash.  Neurological:     Mental Status: He is alert and oriented to person, place, and time.  Psychiatric:        Behavior: Behavior is cooperative.      UC Treatments / Results  Labs (all labs ordered are listed, but only abnormal results are displayed) Labs Reviewed - No data to display  EKG   Radiology No results found.  Procedures Procedures (including critical care time)  Medications Ordered in UC Medications  ipratropium-albuterol (DUONEB) 0.5-2.5 (3) MG/3ML nebulizer solution 3 mL (3 mLs Nebulization Given 07/06/23 1537)  methylPREDNISolone acetate (DEPO-MEDROL) injection 40 mg (40 mg Intramuscular Given 07/06/23 1544)  ipratropium-albuterol (DUONEB) 0.5-2.5 (3) MG/3ML nebulizer solution 3 mL (3 mLs Nebulization Given 07/06/23 1623)    Initial Impression / Assessment and Plan / UC Course  I have reviewed the triage vital signs and the nursing notes.  Pertinent labs & imaging results that were available during my care of the patient were reviewed by me and considered in my medical decision making (see chart for details).   Initially in triage, patient is in acute respiratory distress with audible wheezes and tachypnea.  SpO2  is 94% on room air, otherwise vital signs are stable.  DuoNeb breathing treatment and Depo-Medrol 40 mg IM were given in urgent care without much improvement after about 30 minutes.  Recheck showed patient still with audible wheezes and tachypnea.  Second DuoNeb  breathing treatment given.  1. Mild intermittent asthma with exacerbation Patient vital signs improved after second breathing treatment, patient no longer with audible wheezes.  SpO2 97% on room air prior to discharge and patient no longer tachypneic.  Start oral prednisone tomorrow morning, azithromycin.  Refill sent for albuterol inhaler.  Strict ER precautions reviewed with patient at length.  The patient was given the opportunity to ask questions.  All questions answered to their satisfaction.  The patient is in agreement to this plan.    Final Clinical Impressions(s) / UC Diagnoses   Final diagnoses:  Mild intermittent asthma with exacerbation     Discharge Instructions      As we discussed, I suspect your asthma is exacerbated due to a viral upper respiratory infection.  However, I do not believe you are contagious at that any longer but I do think it is causing inflammation in your chest.  We gave you 2 breathing treatments today which helped open up your airway.  We also gave you an injection of steroid medicine to help with inflammation in your chest.  In addition, start taking the oral prednisone tomorrow morning.  You can also start the azithromycin and take as prescribed.  Start taking guaifenesin 600 mg twice daily and make sure you are drinking plenty of fluids.  Seek care in the emergency room if you develop shortness of breath, chest tightness, or wheezing that does not improve with the albuterol.      ED Prescriptions     Medication Sig Dispense Auth. Provider   predniSONE (DELTASONE) 20 MG tablet Take 2 tablets (40 mg total) by mouth daily with breakfast for 5 days. 10 tablet Cathlean Marseilles A, NP   azithromycin (ZITHROMAX) 250 MG tablet Take (2) tablets by mouth on day 1, then take (1) tablet by mouth on days 2-5. 6 tablet Cathlean Marseilles A, NP   albuterol (PROAIR HFA) 108 (90 Base) MCG/ACT inhaler ProAir HFA 90 mcg/actuation aerosol inhaler  INHALE 1 TO 2 PUFFS  EVERY 4 TO 6 HOURS AS NEEDED FOR WHEEZING 8 g Valentino Nose, NP      PDMP not reviewed this encounter.   Valentino Nose, NP 07/06/23 [phone]

## 2023-07-06 NOTE — ED Triage Notes (Signed)
Pt c/o cough, congestion, wheezing, and SOB x1wk. Pt appears SOB with audible wheezing on arrival. NP at bedside and duo neb given.

## 2023-07-12 ENCOUNTER — Ambulatory Visit (INDEPENDENT_AMBULATORY_CARE_PROVIDER_SITE_OTHER): Payer: BC Managed Care – PPO | Admitting: Emergency Medicine

## 2023-07-12 ENCOUNTER — Encounter: Payer: Self-pay | Admitting: Emergency Medicine

## 2023-07-12 VITALS — BP 136/80 | HR 78 | Ht 66.0 in | Wt 244.8 lb

## 2023-07-12 DIAGNOSIS — J4520 Mild intermittent asthma, uncomplicated: Secondary | ICD-10-CM | POA: Diagnosis not present

## 2023-07-12 DIAGNOSIS — R9389 Abnormal findings on diagnostic imaging of other specified body structures: Secondary | ICD-10-CM

## 2023-07-12 NOTE — Assessment & Plan Note (Signed)
Post-COVID-19 Respiratory Symptoms Recent COVID-19 infection with persistent wheezing and cough. No audible wheezing on examination. Asthmatic COPD, currently using Albuterol as needed. -Continue Albuterol as needed. -Monitor frequency of Albuterol use and contact office if usage does not decrease as COVID-19 symptoms resolve. -Consider re-initiation of daily inhaler medication if symptoms persist.   General Health Maintenance -Defer flu shot until Fall 2025. -Defer COVID-19 vaccination for 3 months due to recent infection.

## 2023-07-12 NOTE — Patient Instructions (Signed)
VISIT SUMMARY:  During your recent visit, we discussed your recent bout of COVID-19 and the lingering respiratory symptoms you've been experiencing. We also reviewed your asthmatic COPD and venous insufficiency in your right leg. Your current medications and treatments seem to be managing these conditions well.  YOUR PLAN:  -POST-COVID-19 RESPIRATORY SYMPTOMS: You've been experiencing wheezing and coughing after your COVID-19 infection. Continue using your Albuterol inhaler as needed. If you find you're using it more frequently as your COVID-19 symptoms resolve, please contact our office. If these symptoms persist, we may consider restarting your daily inhaler medication.  -ASTHMATIC COPD: Your asthmatic COPD is stable with no changes in your medication.   -ABNORMAL CT CHEST: We will continue to monitor the areas of inflammation in your lungs. We plan to repeat a CT scan of your chest in April 2025 to keep an eye on these areas.  -VENOUS INSUFFICIENCY: You've been managing the swelling in your right leg with compression stockings, as advised by your venous specialist. Please continue to use these as directed.  -GENERAL HEALTH MAINTENANCE: We will defer your flu shot until Fall 2025 and your COVID-19 vaccination for 3 months due to your recent infection.  INSTRUCTIONS:  Please continue to monitor your respiratory symptoms and the frequency of your Albuterol use. If you notice any changes or if your symptoms persist, please contact our office. Continue to use your compression stockings as directed by your venous specialist. We will plan to repeat a CT scan of your chest in April 2025.

## 2023-07-12 NOTE — Assessment & Plan Note (Signed)
With some scattered groundglass, more focal nodularity and groundglass that is waxed and waned.  We will plan to repeat a CT scan of his chest in April 2025 to follow for interval stability.

## 2023-07-12 NOTE — Progress Notes (Signed)
Subjective:    Patient ID: Andre Turner, male    DOB: 26-Sep-1961, 62 y.o.   MRN: [id number]  HPI 62 year old former smoker (3 pack years) with a history of rheumatoid arthritis, hypertension, allergic rhinitis, GERD, RLS.  He carries a history of possible asthma, has never really been worked up.   ROV 07/12/2023 --  The 62 year old patient with a history of asthmatic COPD, hypertension, rheumatoid arthritis, GERD, and allergic rhinitis, presented for a follow-up visit. The patient has been monitored for subtle focal ground glass infiltrate in the left upper lobe and right middle lobe of unclear etiology on serial chest imaging.  The patient reported a recent bout of flu-like symptoms, which was later confirmed to be COVID-19. The onset of symptoms was approximately three weeks prior to the visit, with a positive test result confirmed two weeks ago. The patient sought care at an urgent care facility, where he received two breathing treatments and a course of erythromycin and prednisone. The patient reported improvement in symptoms following this treatment.  The patient's maintenance inhaler medications include albuterol as needed, and hydrocodone 10/325 for cough suppression. The patient reported increased use of albuterol due to residual wheezing and coughing post-COVID-19 infection. The patient also reported some shortness of breath when moving around, but noted that this has improved compared to two weeks ago.  The patient also reported wearing compression stockings due to venous insufficiency in the right leg, which causes fluid accumulation. The patient has consulted with a venous specialist for this issue.  The patient's last chest scan was in April 2023, which showed less inflammation and no change in the hazy nodules.  Review of Systems As per HPI     Objective:   Physical Exam Vitals:   07/12/23 1312  BP: 136/80  Pulse: 78  SpO2: 94%  Weight: 244 lb 12.8 oz (111 kg)   Height: 5\' 6"  (1.676 m)   Gen: Pleasant, obese man, in no distress,  normal affect, some intermittent cough  ENT: No lesions,  mouth clear,  oropharynx clear, no postnasal drip  Neck: No JVD, no stridor, strong voice  Lungs: No use of accessory muscles, few scattered rhonchi, right greater than left, no crackles or wheezes  Cardiovascular: RRR, heart sounds normal, no murmur or gallops  Musculoskeletal: No deformities, no cyanosis or clubbing  Neuro: alert, awake, non focal  Skin: Warm, no lesions or rash      Assessment & Plan:  Abnormal CT of the chest With some scattered groundglass, more focal nodularity and groundglass that is waxed and waned.  We will plan to repeat a CT scan of his chest in April 2025 to follow for interval stability.  Mild intermittent asthma Post-COVID-19 Respiratory Symptoms Recent COVID-19 infection with persistent wheezing and cough. No audible wheezing on examination. Asthmatic COPD, currently using Albuterol as needed. -Continue Albuterol as needed. -Monitor frequency of Albuterol use and contact office if usage does not decrease as COVID-19 symptoms resolve. -Consider re-initiation of daily inhaler medication if symptoms persist.   General Health Maintenance -Defer flu shot until Fall 2025. -Defer COVID-19 vaccination for 3 months due to recent infection.    Levy Pupa, MD, PhD 07/12/2023, 1:29 PM [facility] [phone] or if no answer [phone]

## 2023-07-23 ENCOUNTER — Ambulatory Visit (HOSPITAL_COMMUNITY)
Admission: EM | Admit: 2023-07-23 | Discharge: 2023-07-23 | Disposition: A | Discharge status: Home / Self Care | Payer: BC Managed Care – PPO | Attending: Internal Medicine | Admitting: Internal Medicine

## 2023-07-23 ENCOUNTER — Encounter (HOSPITAL_COMMUNITY): Payer: Self-pay

## 2023-07-23 DIAGNOSIS — R197 Diarrhea, unspecified: Secondary | ICD-10-CM

## 2023-07-23 DIAGNOSIS — Z87891 Personal history of nicotine dependence: Secondary | ICD-10-CM | POA: Diagnosis not present

## 2023-07-23 DIAGNOSIS — R109 Unspecified abdominal pain: Secondary | ICD-10-CM | POA: Insufficient documentation

## 2023-07-23 LAB — BASIC METABOLIC PANEL
Anion gap: 10 (ref 5–15)
BUN: 22 mg/dL (ref 8–23)
CO2: 24 mmol/L (ref 22–32)
Calcium: 9.1 mg/dL (ref 8.9–10.3)
Chloride: 101 mmol/L (ref 98–111)
Creatinine, Ser: 1.28 mg/dL — ABNORMAL HIGH (ref 0.61–1.24)
GFR, Estimated: >60 mL/min (ref >60)
Glucose, Bld: 86 mg/dL (ref 70–99)
Potassium: 4 mmol/L (ref 3.5–5.1)
Sodium: 135 mmol/L (ref 135–145)

## 2023-07-23 LAB — CBC
HCT: 40.7 % (ref 39.0–52.0)
Hemoglobin: 13.7 g/dL (ref 13.0–17.0)
MCH: 32.4 pg (ref 26.0–34.0)
MCHC: 33.7 g/dL (ref 30.0–36.0)
MCV: 96.2 fL (ref 80.0–100.0)
Platelets: 255 10*3/uL (ref 150–400)
RBC: 4.23 MIL/uL (ref 4.22–5.81)
RDW: 12.9 % (ref 11.5–15.5)
WBC: 11.9 10*3/uL — ABNORMAL HIGH (ref 4.0–10.5)
nRBC: 0 % (ref 0.0–0.2)

## 2023-07-23 LAB — C DIFFICILE QUICK SCREEN W PCR REFLEX
C Diff antigen: NEGATIVE
C Diff interpretation: NOT DETECTED
C Diff toxin: NEGATIVE

## 2023-07-23 NOTE — ED Triage Notes (Signed)
Patient needing metronidazole refilled. Patient having continuous diarrhea for 2 weeks. Patient states ran out of bottle water so drank tap for 4 days.

## 2023-07-23 NOTE — Discharge Instructions (Addendum)
Stool sample pending.  Staff will call if stool sample/blood work is abnormal. Continue drinking plenty of water. Follow-up with PCP.

## 2023-07-23 NOTE — ED Provider Notes (Signed)
[facility]    CSN: [id number] Arrival date & time: 07/23/23  1335      History   Chief Complaint Chief Complaint  Patient presents with   Medication Refill   Diarrhea    HPI Gaines Cartmell is a 62 y.o. male.   Tavarus Poteete is a 62 y.o. male presenting for chief complaint of persistent diarrhea for the last 2 weeks.  He has had consistent watery diarrhea multiple times per day for the last 2 weeks and suspects this may be related to recently drinking city water for the first time in years. He only drinks bottled water and had to drink city water for four days a few weeks ago. Diarrhea started after this and is associated with crampy abdominal discomfort that is relieved with bowel movements. This has happened in the past, most recently 10 years ago, and he was treated for infectious diarrhea with flagyl antibiotic which cleared all of it up. He was recently on azithromycin antibiotic for sinus infection, otherwise no recent medication changes, hospitalizations, recent travel, or sick contacts. Denies fevers, chills, nausea, vomiting, rash, blood/mucous to the stools, dizziness, syncope, and heart palpitations. Drinks 3-4 quarts of water per day, history of prediabetes. Has not attempted use of any OTC meds to help with symptoms.    Medication Refill Diarrhea   Past Medical History:  Diagnosis Date   Allergic rhinitis    Anemia    Arthritis    rhuematoid and osteo arthritis   Cataract    Chronic headache    migraines   Diverticulitis    GERD (gastroesophageal reflux disease)    History of benign prostatic hypertrophy    Hyperlipidemia    Hypertension    Restless leg    Tricuspid regurgitation    Unspecified asthma(493.90)     Patient Active Problem List   Diagnosis Date Noted   Osteoarthritis (arthritis due to wear and tear of joints) 10/19/2022   Lymphocytosis 06/10/2022   Iron deficiency anemia 06/10/2022   Abnormal CT of the  chest 04/08/2021   URI (upper respiratory infection) 10/15/2020   Restrictive lung disease 08/17/2020   Shortness of breath 05/21/2020   Hypertension 07/13/2011   Hyperlipidemia 07/13/2011   Skin lesions, generalized 06/05/2011   Erectile dysfunction 06/05/2011   Allergic rhinitis 06/25/2007   Mild intermittent asthma 06/25/2007   Headache 06/25/2007   BENIGN PROSTATIC HYPERTROPHY, HX OF 06/25/2007    Past Surgical History:  Procedure Laterality Date   COLONOSCOPY     INGUINAL HERNIA REPAIR     KNEE ARTHROSCOPY Right 11/18/2022   PATELLA-FEMORAL ARTHROPLASTY Left 07/14/2016   Procedure: LEFT KNEE PATELLA-FEMORAL  REPLACEMENT ARTHROPLASTY;  Surgeon: Loreta Ave, MD;  Location: [facility];  Service: Orthopedics;  Laterality: Left;   REPLACEMENT TOTAL KNEE Left 01/24/2022   TENDON REPAIR     TONSILLECTOMY         Home Medications    Prior to Admission medications   Medication Sig Start Date End Date Taking? Authorizing Provider  albuterol (PROAIR HFA) 108 (90 Base) MCG/ACT inhaler ProAir HFA 90 mcg/actuation aerosol inhaler  INHALE 1 TO 2 PUFFS EVERY 4 TO 6 HOURS AS NEEDED FOR WHEEZING 07/06/23  Yes Cathlean Marseilles A, NP  allopurinol (ZYLOPRIM) 100 MG tablet Take 100 mg by mouth daily.   Yes [provider]  B Complex-C (B-COMPLEX WITH VITAMIN C) tablet Take 1 tablet by mouth daily.   Yes [provider]  diphenoxylate-atropine (LOMOTIL) 2.5-0.025 MG  tablet Take 1 tablet by mouth daily.   Yes [provider]  glucosamine-chondroitin 500-400 MG tablet Take by mouth. 03/11/21  Yes [provider]  Iron-FA-B Cmp-C-Biot-Probiotic (FUSION PLUS) CAPS Take 1 capsule by mouth daily. 07/25/22  Yes [provider]  losartan (COZAAR) 100 MG tablet Take 1 tablet (100 mg total) by mouth daily. 11/10/22  Yes Crenshaw, Madolyn Frieze, MD  NON FORMULARY Cod liver Oil with Vit D - 1000 mg.   Yes [provider]  NON FORMULARY  Prostate Optimizer   Yes [provider]  rosuvastatin (CRESTOR) 20 MG tablet Take 10 mg by mouth daily.    Yes [provider]  Specialty Vitamins Products (PROSTATE PO) Take 1 tablet by mouth daily.   Yes [provider]  UNABLE TO FIND Take 2 tablets by mouth daily. Med Name: [name].   Yes [provider]  azithromycin (ZITHROMAX) 250 MG tablet Take (2) tablets by mouth on day 1, then take (1) tablet by mouth on days 2-5. 07/06/23   Valentino Nose, NP  diclofenac (VOLTAREN) 75 MG EC tablet  10/31/22   [provider]  omeprazole (PRILOSEC) 20 MG capsule Take 1 capsule (20 mg total) by mouth daily. 11/17/22   Meredith Pel, NP    Family History Family History  Problem Relation Age of Onset   Colon cancer Mother    Lung cancer Mother    Obesity Mother    Prostate cancer Father    Hypertension Father    Heart failure Father    Leukemia Father    Colon cancer Maternal Grandmother    Diabetes Other        FH- maternal side   Prostate cancer Other        FH-maternal side   Esophageal cancer Neg Hx    Rectal cancer Neg Hx    Stomach cancer Neg Hx     Social History Social History   Tobacco Use   Smoking status: Former    Current packs/day: 3.00    Average packs/day: 3.0 packs/day for 1 year (3.0 ttl pk-yrs)    Types: Cigarettes   Smokeless tobacco: Never   Tobacco comments:    stopped at age 66  Vaping Use   Vaping status: Never Used  Substance Use Topics   Alcohol use: Yes    Comment: 1 drink per day   Drug use: No     Allergies   Lisinopril-hydrochlorothiazide, Meloxicam, Metoprolol, Celebrex [celecoxib], Percocet [oxycodone-acetaminophen], Sulfa antibiotics, Sulfonamide derivatives, Tetracycline, and Latex   Review of Systems Review of Systems  Gastrointestinal:  Positive for diarrhea.  Per HPI   Physical Exam Triage Vital Signs ED Triage Vitals  Encounter Vitals Group     BP 07/23/23 1432 119/72      Systolic BP Percentile --      Diastolic BP Percentile --      Pulse Rate 07/23/23 1432 78     Resp 07/23/23 1432 18     Temp 07/23/23 1432 98.1 F (36.7 C)     Temp Source 07/23/23 1432 Oral     SpO2 07/23/23 1432 93 %     Weight 07/23/23 1431 244 lb 11.4 oz (111 kg)     Height 07/23/23 1431 5\' 6"  (1.676 m)     Head Circumference --      Peak Flow --      Pain Score 07/23/23 1430 0     Pain Loc --  Pain Education --      Exclude from Growth Chart --    No data found.  Updated Vital Signs BP 119/72 (BP Location: Left Arm)   Pulse 78   Temp 98.1 F (36.7 C) (Oral)   Resp 18   Ht 5\' 6"  (1.676 m)   Wt 244 lb 11.4 oz (111 kg)   SpO2 93%   BMI 39.50 kg/m   Visual Acuity Right Eye Distance:   Left Eye Distance:   Bilateral Distance:    Right Eye Near:   Left Eye Near:    Bilateral Near:     Physical Exam Vitals and nursing note reviewed.  Constitutional:      Appearance: He is not ill-appearing or toxic-appearing.  HENT:     Head: Normocephalic and atraumatic.     Right Ear: Hearing and external ear normal.     Left Ear: Hearing and external ear normal.     Nose: Nose normal.     Mouth/Throat:     Lips: Pink.  Eyes:     General: Lids are normal. Vision grossly intact. Gaze aligned appropriately.     Extraocular Movements: Extraocular movements intact.     Conjunctiva/sclera: Conjunctivae normal.  Cardiovascular:     Rate and Rhythm: Normal rate and regular rhythm.     Heart sounds: Normal heart sounds, S1 normal and S2 normal.  Pulmonary:     Effort: Pulmonary effort is normal. No respiratory distress.     Breath sounds: Normal breath sounds and air entry. No wheezing, rhonchi or rales.  Chest:     Chest wall: No tenderness.  Abdominal:     General: Abdomen is flat. Bowel sounds are normal.     Palpations: Abdomen is soft.     Tenderness: There is no abdominal tenderness. There is no right CVA tenderness, left CVA tenderness, guarding or rebound.   Musculoskeletal:     Cervical back: Neck supple.  Skin:    General: Skin is warm and dry.     Capillary Refill: Capillary refill takes less than 2 seconds.     Findings: No rash.  Neurological:     General: No focal deficit present.     Mental Status: He is alert and oriented to person, place, and time. Mental status is at baseline.     Cranial Nerves: No dysarthria or facial asymmetry.  Psychiatric:        Mood and Affect: Mood normal.        Speech: Speech normal.        Behavior: Behavior normal.        Thought Content: Thought content normal.        Judgment: Judgment normal.      UC Treatments / Results  Labs (all labs ordered are listed, but only abnormal results are displayed)   EKG   Radiology No results found.  Procedures Procedures (including critical care time)  Medications Ordered in UC Medications - No data to display  Initial Impression / Assessment and Plan / UC Course  I have reviewed the triage vital signs and the nursing notes.  Pertinent labs & imaging results that were available during my care of the patient were reviewed by me and considered in my medical decision making (see chart for details).   1. Diarrhea of presumed infectious origin Diarrhea presumably infectious, patient able to provide stool sample in clinic.  C diff and GI panel by PCR pending, staff will call with results if abnormal and  treat for infectious diarrhea as indicated.   CBC and BMP pending to evaluated for leukocytosis, electrolyte abnormality secondary to diarrhea, and renal function.   He is no longer drinking city water and has plenty of purified water at home for drinking. He is overall well appearing in clinic and with hemodynamically stable vital signs.  PCP follow-up encouraged in the next 2-3 days should symptoms fail to improve or worsen.  Measures to ensure adequate hydration discussed.   Counseled patient on potential for adverse effects with medications  prescribed/recommended today, strict ER and return-to-clinic precautions discussed, patient verbalized understanding.    Final Clinical Impressions(s) / UC Diagnoses   Final diagnoses:  Diarrhea of presumed infectious origin     Discharge Instructions      Stool sample pending.  Staff will call if stool sample/blood work is abnormal. Continue drinking plenty of water. Follow-up with PCP.     ED Prescriptions   None    PDMP not reviewed this encounter.   Carlisle Beers, [place] 07/23/23 2042

## 2023-07-25 ENCOUNTER — Telehealth (HOSPITAL_COMMUNITY): Payer: Self-pay | Admitting: Physician Assistant

## 2023-07-25 LAB — GASTROINTESTINAL PANEL BY PCR, STOOL (REPLACES STOOL CULTURE)

## 2023-07-25 NOTE — Telephone Encounter (Signed)
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  Follow-up as needed

## 2023-08-07 ENCOUNTER — Encounter: Payer: Self-pay | Admitting: Cardiology

## 2023-08-08 ENCOUNTER — Other Ambulatory Visit: Payer: Self-pay | Admitting: Cardiology

## 2023-09-29 ENCOUNTER — Ambulatory Visit (HOSPITAL_COMMUNITY)
Admission: EM | Admit: 2023-09-29 | Discharge: 2023-09-29 | Disposition: A | Discharge status: Home / Self Care | Payer: BC Managed Care – PPO

## 2023-09-29 DIAGNOSIS — J441 Chronic obstructive pulmonary disease with (acute) exacerbation: Secondary | ICD-10-CM

## 2023-09-29 MED ORDER — IPRATROPIUM-ALBUTEROL 0.5-2.5 (3) MG/3ML IN SOLN: 3.0000 mL | RESPIRATORY_TRACT | 0 refills | Status: DC | PRN

## 2023-09-29 MED ORDER — METHYLPREDNISOLONE SODIUM SUCC 125 MG IJ SOLR
INTRAMUSCULAR | Status: AC
  Filled 2023-09-29: qty 2

## 2023-09-29 MED ORDER — PREDNISONE 20 MG PO TABS: ORAL_TABLET | ORAL | 0 refills | Status: DC

## 2023-09-29 MED ORDER — IPRATROPIUM-ALBUTEROL 0.5-2.5 (3) MG/3ML IN SOLN
3.0000 mL | Freq: Once | RESPIRATORY_TRACT | Status: AC
  Administered 2023-09-29: 3 mL via RESPIRATORY_TRACT

## 2023-09-29 MED ORDER — IPRATROPIUM-ALBUTEROL 0.5-2.5 (3) MG/3ML IN SOLN
RESPIRATORY_TRACT | Status: AC
  Filled 2023-09-29: qty 3

## 2023-09-29 MED ORDER — BUDESONIDE-FORMOTEROL FUMARATE 160-4.5 MCG/ACT IN AERO: 2.0000 | INHALATION_SPRAY | Freq: Two times a day (BID) | RESPIRATORY_TRACT | 12 refills | Status: DC

## 2023-09-29 MED ORDER — AZITHROMYCIN 250 MG PO TABS: ORAL_TABLET | ORAL | 0 refills | Status: DC

## 2023-09-29 MED ORDER — METHYLPREDNISOLONE SODIUM SUCC 125 MG IJ SOLR
60.0000 mg | Freq: Once | INTRAMUSCULAR | Status: AC
  Administered 2023-09-29: 60 mg via INTRAMUSCULAR

## 2023-09-29 NOTE — ED Triage Notes (Signed)
Pt reports cough and sob x 1 week. Pt reports his asthma has flare-up. Pt is requesting a steroid injection.

## 2023-09-29 NOTE — ED Provider Notes (Signed)
[facility]    CSN: [id number] Arrival date & time: 09/29/23  1604      History   Chief Complaint Chief Complaint  Patient presents with   Cough   Asthma    HPI Andre Turner is a 62 y.o. male.   Patient has a history of COPD asthma overlap.  He has developed worsening of cough and shortness of breath.  Patient reports this will be his second exacerbation and less than 3 months.  He is normally seen by [facility] pulmonology but has not seen anyone recently.  His current inhalers include albuterol only.   Cough Cough characteristics:  Productive Sputum characteristics:  Green Associated symptoms: shortness of breath and wheezing   Asthma Associated symptoms include shortness of breath.    Past Medical History:  Diagnosis Date   Allergic rhinitis    Anemia    Arthritis    rhuematoid and osteo arthritis   Cataract    Chronic headache    migraines   Diverticulitis    GERD (gastroesophageal reflux disease)    History of benign prostatic hypertrophy    Hyperlipidemia    Hypertension    Restless leg    Tricuspid regurgitation    Unspecified asthma(493.90)     Patient Active Problem List   Diagnosis Date Noted   Osteoarthritis (arthritis due to wear and tear of joints) 10/19/2022   Lymphocytosis 06/10/2022   Iron deficiency anemia 06/10/2022   Abnormal CT of the chest 04/08/2021   URI (upper respiratory infection) 10/15/2020   Restrictive lung disease 08/17/2020   Shortness of breath 05/21/2020   Hypertension 07/13/2011   Hyperlipidemia 07/13/2011   Skin lesions, generalized 06/05/2011   Erectile dysfunction 06/05/2011   Allergic rhinitis 06/25/2007   Mild intermittent asthma 06/25/2007   Headache 06/25/2007   BENIGN PROSTATIC HYPERTROPHY, HX OF 06/25/2007    Past Surgical History:  Procedure Laterality Date   COLONOSCOPY     INGUINAL HERNIA REPAIR     KNEE ARTHROSCOPY Right 11/18/2022   PATELLA-FEMORAL ARTHROPLASTY Left  07/14/2016   Procedure: LEFT KNEE PATELLA-FEMORAL  REPLACEMENT ARTHROPLASTY;  Surgeon: Loreta Ave, MD;  Location: [facility];  Service: Orthopedics;  Laterality: Left;   REPLACEMENT TOTAL KNEE Left 01/24/2022   TENDON REPAIR     TONSILLECTOMY         Home Medications    Prior to Admission medications   Medication Sig Start Date End Date Taking? Authorizing Provider  albuterol (PROAIR HFA) 108 (90 Base) MCG/ACT inhaler ProAir HFA 90 mcg/actuation aerosol inhaler  INHALE 1 TO 2 PUFFS EVERY 4 TO 6 HOURS AS NEEDED FOR WHEEZING 07/06/23  Yes Cathlean Marseilles A, NP  allopurinol (ZYLOPRIM) 100 MG tablet Take 100 mg by mouth daily.   Yes [provider]  B Complex-C (B-COMPLEX WITH VITAMIN C) tablet Take 1 tablet by mouth daily.   Yes [provider]  budesonide-formoterol (SYMBICORT) 160-4.5 MCG/ACT inhaler Inhale 2 puffs into the lungs in the morning and at bedtime. Rinse and gargle after each use. 09/29/23  Yes Dannya Pitkin, Linde Gillis, NP  diphenoxylate-atropine (LOMOTIL) 2.5-0.025 MG tablet Take 1 tablet by mouth daily.   Yes [provider]  DULoxetine (CYMBALTA) 30 MG capsule Take 30 mg by mouth 2 (two) times daily. 04/10/23  Yes [provider]  fenofibrate (TRICOR) 48 MG tablet Take 1 tablet by mouth daily. 08/28/23  Yes [provider]  ipratropium-albuterol (DUONEB) 0.5-2.5 (3) MG/3ML SOLN Take 3 mLs by nebulization every 4 (  four) hours as needed. 09/29/23  Yes Donatella Walski, Linde Gillis, NP  Iron-FA-B Cmp-C-Biot-Probiotic (FUSION PLUS) CAPS Take 1 capsule by mouth daily. 07/25/22  Yes [provider]  losartan (COZAAR) 100 MG tablet TAKE 1 TABLET(100 MG) BY MOUTH DAILY 08/09/23  Yes Crenshaw, Madolyn Frieze, MD  NON FORMULARY Cod liver Oil with Vit D - 1000 mg.   Yes [provider]  NON FORMULARY Prostate Optimizer   Yes [provider]  predniSONE (DELTASONE) 20 MG tablet Take 2 tabs x 4 days and then 1 tab x 4 days,  then 1/2 tab x 4 days 09/29/23  Yes Seanmichael Salmons, Linde Gillis, NP  rosuvastatin (CRESTOR) 20 MG tablet Take 10 mg by mouth daily.    Yes [provider]  Specialty Vitamins Products (PROSTATE PO) Take 1 tablet by mouth daily.   Yes [provider]  UNABLE TO FIND Take 2 tablets by mouth daily. Med Name: [name].   Yes [provider]  azithromycin (ZITHROMAX) 250 MG tablet Take (2) tablets by mouth on day 1, then take (1) tablet by mouth on days 2-5. 09/29/23   Dorrance Sellick, Linde Gillis, NP  diclofenac (VOLTAREN) 75 MG EC tablet  10/31/22   [provider]  glucosamine-chondroitin 500-400 MG tablet Take by mouth. 03/11/21   [provider]  omeprazole (PRILOSEC) 20 MG capsule Take 1 capsule (20 mg total) by mouth daily. 11/17/22   Meredith Pel, NP    Family History Family History  Problem Relation Age of Onset   Colon cancer Mother    Lung cancer Mother    Obesity Mother    Prostate cancer Father    Hypertension Father    Heart failure Father    Leukemia Father    Colon cancer Maternal Grandmother    Diabetes Other        FH- maternal side   Prostate cancer Other        FH-maternal side   Esophageal cancer Neg Hx    Rectal cancer Neg Hx    Stomach cancer Neg Hx     Social History Social History   Tobacco Use   Smoking status: Former    Current packs/day: 3.00    Average packs/day: 3.0 packs/day for 1 year (3.0 ttl pk-yrs)    Types: Cigarettes   Smokeless tobacco: Never   Tobacco comments:    stopped at age 49  Vaping Use   Vaping status: Never Used  Substance Use Topics   Alcohol use: Yes    Comment: 1 drink per day   Drug use: No     Allergies   Lisinopril-hydrochlorothiazide, Meloxicam, Metoprolol, Celebrex [celecoxib], Percocet [oxycodone-acetaminophen], Sulfa antibiotics, Sulfonamide derivatives, Tetracycline, and Latex   Review of Systems Review of Systems  Constitutional:  Positive for fatigue.  HENT:  Positive for  congestion.   Respiratory:  Positive for cough, shortness of breath and wheezing.      Physical Exam Triage Vital Signs ED Triage Vitals  Encounter Vitals Group     BP 09/29/23 1633 127/66     Systolic BP Percentile --      Diastolic BP Percentile --      Pulse Rate 09/29/23 1633 93     Resp 09/29/23 1633 18     Temp 09/29/23 1633 98.5 F (36.9 C)     Temp Source 09/29/23 1633 Oral     SpO2 09/29/23 1633 96 %     Weight --      Height --  Head Circumference --      Peak Flow --      Pain Score 09/29/23 1635 0     Pain Loc --      Pain Education --      Exclude from Growth Chart --    No data found.  Updated Vital Signs BP 127/66 (BP Location: Left Arm)   Pulse 93   Temp 98.5 F (36.9 C) (Oral)   Resp 18   SpO2 96%   Visual Acuity Right Eye Distance:   Left Eye Distance:   Bilateral Distance:    Right Eye Near:   Left Eye Near:    Bilateral Near:     Physical Exam HENT:     Nose: Congestion present.  Pulmonary:     Breath sounds: Examination of the right-upper field reveals wheezing. Examination of the left-upper field reveals wheezing. Examination of the right-lower field reveals decreased breath sounds and wheezing. Examination of the left-lower field reveals decreased breath sounds and wheezing. Decreased breath sounds and wheezing present.  Neurological:     Mental Status: He is alert.      UC Treatments / Results  Labs (all labs ordered are listed, but only abnormal results are displayed) Labs Reviewed - No data to display  EKG   Radiology No results found.  Procedures Procedures (including critical care time)  Medications Ordered in UC Medications  ipratropium-albuterol (DUONEB) 0.5-2.5 (3) MG/3ML nebulizer solution 3 mL (3 mLs Nebulization Given 09/29/23 1738)  methylPREDNISolone sodium succinate (SOLU-MEDROL) 125 mg/2 mL injection 60 mg (60 mg Intramuscular Given 09/29/23 1736)    Initial Impression / Assessment and Plan / UC  Course  I have reviewed the triage vital signs and the nursing notes.  Pertinent labs & imaging results that were available during my care of the patient were reviewed by me and considered in my medical decision making (see chart for details).   Patient has a positive history for COPD asthma overlap syndrome.  He is normally followed by [facility] pulmonology but has not had a follow-up appointment with them recently.  We will treat COPD exacerbation at this time as he is having excessive mucus production with increased shortness of breath and wheezing.  Breathing treatment given in clinic along with a DuoNeb treatment.  Patient does not have nebulizer at home we will go ahead and order a nebulizer machine with some DuoNebs. His only current inhaler is albuterol he states Virgel Bouquet has not been effective we will order Symbicort inhaler at this time. Reassessment of lungs after breathing treatment showed increased airflow and decreased wheezing.  He has rhonchus bilaterally.  Patient to be treated with antibiotics as well as prednisone.  Nebulizer will be sent home.  He is encouraged to follow-up with pulmonary or PCP for worsening exacerbations.  Education given on red flag symptoms.  Final Clinical Impressions(s) / UC Diagnoses   Final diagnoses:  COPD exacerbation [facility])     Discharge Instructions      Prednisone:    Take 2 tabs x 4 days and then 1 tab x 4 days, then 1/2 tab x 4 days Zpak  antibiotic as ordered Symbicort inhaler 2 puffs bid - Rinse mouth and gargle after each use Duo Neb - nebulizer treatments up to 4 times daily. Follow up with pulmonology      ED Prescriptions     Medication Sig Dispense Auth. Provider   azithromycin (ZITHROMAX) 250 MG tablet Take (2) tablets by mouth on day 1, then take (  1) tablet by mouth on days 2-5. 6 tablet Honor Fairbank M, NP   predniSONE (DELTASONE) 20 MG tablet Take 2 tabs x 4 days and then 1 tab x 4 days, then 1/2 tab x 4 days 14 tablet Cloie Wooden,  Lakecia Deschamps M, NP   ipratropium-albuterol (DUONEB) 0.5-2.5 (3) MG/3ML SOLN Take 3 mLs by nebulization every 4 (four) hours as needed. 180 mL Ladd Cen M, NP   budesonide-formoterol (SYMBICORT) 160-4.5 MCG/ACT inhaler Inhale 2 puffs into the lungs in the morning and at bedtime. Rinse and gargle after each use. 1 each Gabbie Marzo, Linde Gillis, NP      PDMP not reviewed this encounter.   Nelda Marseille, NP 09/29/23 [phone]

## 2023-09-29 NOTE — Discharge Instructions (Addendum)
Prednisone:    Take 2 tabs x 4 days and then 1 tab x 4 days, then 1/2 tab x 4 days Zpak  antibiotic as ordered Symbicort inhaler 2 puffs bid - Rinse mouth and gargle after each use Duo Neb - nebulizer treatments up to 4 times daily. Follow up with pulmonology

## 2023-11-21 NOTE — Progress Notes (Deleted)
   Cardiology Office Note    Date:  11/21/2023  ID:  Andre Turner, DOB 1960/11/12, MRN [id number] PCP:  Lindaann Pascal, PA-C  Cardiologist:  Olga Millers, MD  Electrophysiologist:  None   Chief Complaint: ***  History of Present Illness: .    Andre Turner is a 63 y.o. male with visit-pertinent history of ***  Labwork independently reviewed:   ROS: .    Please see the history of present illness. Otherwise, review of systems is positive for ***.  All other systems are reviewed and otherwise negative.  Studies Reviewed: [name]    EKG:  EKG is ordered today, personally reviewed, demonstrating ***  CV Studies: Cardiac studies reviewed are outlined and summarized above. Otherwise please see EMR for full report.   Current Reported Medications:.    No outpatient medications have been marked as taking for the 11/29/23 encounter (Appointment) with Rip Harbour, NP.    Physical Exam:    VS:  There were no vitals taken for this visit.   Wt Readings from Last 3 Encounters:  07/23/23 244 lb 11.4 oz (111 kg)  07/12/23 244 lb 12.8 oz (111 kg)  12/09/22 258 lb (117 kg)    GEN: Well nourished, well developed in no acute distress NECK: No JVD; No carotid bruits CARDIAC: ***RRR, no murmurs, rubs, gallops RESPIRATORY:  Clear to auscultation without rales, wheezing or rhonchi  ABDOMEN: Soft, non-tender, non-distended EXTREMITIES:  No edema; No acute deformity   Asessement and Plan:.     ***     Disposition: F/u with ***  Signed, Rip Harbour, NP

## 2023-11-22 ENCOUNTER — Encounter: Payer: Self-pay | Admitting: Cardiology

## 2023-11-24 ENCOUNTER — Ambulatory Visit: Payer: BC Managed Care – PPO | Admitting: Adult Health

## 2023-11-29 ENCOUNTER — Ambulatory Visit: Payer: BC Managed Care – PPO | Admitting: Cardiology

## 2023-12-06 NOTE — Progress Notes (Unsigned)
 Cardiology Office Note    Date:  12/08/2023  ID:  Andre Turner, DOB [birth year], MRN [id number] PCP:  Andre Like, NP  Cardiologist:  Andre Millers, MD  Electrophysiologist:  None   Chief Complaint: Palpitations   History of Present Illness: .    Andre Turner is a 63 y.o. male with visit-pertinent history of CAD with cardiac catheterization in October 2012 revealing normal LV function and 20% right coronary artery stenosis.  Echocardiogram in September 2021 indicated normal LV function and no significant valvular disease.  Coronary CTA in September 2021 revealed a coronary calcium score of 54 with minimal nonobstructive CAD.  Patient also has history of restrictive lung disease and hyperlipidemia.  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  Patient was last seen in clinic by Dr. Jens Turner on 06/20/2022.  He remained stable from a cardiac standpoint.  Today Andre Turner presents for follow-up and palpitations.  Patient reports a few episodes of palpitations at night that have woken him from sleep.  He reports he will wake up and feel as though his heart is fluttering in his chest associated with some slight shortness of breath, episodes lasting 45 minutes.  He said he felt his radial pulse and it was weak.  He denies any dizziness, lightheadedness, presyncope or syncope.  He denies any chest pain.  He notes that he started eating more avocados and took a potassium supplement with improvement.  He does endorse some intermittent increased lower extremity edema. ROS: .   Today he denies chest pain, fatigue, melena, hematuria, hemoptysis, diaphoresis, weakness, presyncope, syncope, orthopnea, and PND.  All other systems are reviewed and otherwise negative. Studies Reviewed: [name]    EKG:  EKG is ordered today, personally reviewed, demonstrating  EKG Interpretation Date/Time:  Friday December 08 2023 14:16:31 EST Ventricular Rate:  70 PR Interval:  154 QRS Duration:  72 QT Interval:  394 QTC Calculation: 425 R Axis:   66  Text Interpretation: Normal sinus rhythm Normal ECG Confirmed by Reather Littler [phone]) on 12/08/2023 2:38:46 PM    CV Studies: Cardiac Studies & Procedures   ______________________________________________________________________________________________     ECHOCARDIOGRAM  ECHOCARDIOGRAM COMPLETE 06/16/2020  Narrative ECHOCARDIOGRAM REPORT    Patient Name:   Andre Turner Date of Exam: 06/16/2020 Medical Rec #:  [id number]         Height:       66.0 in Accession #:    [id number]        Weight:       236.0 lb Date of Birth:  March 21, 1961         BSA:          2.146 m Patient Age:    58 years          BP:           138/76 mmHg Patient Gender: M                 HR:           76 bpm. Exam Location:  [address]  Procedure: 2D Echo, Cardiac Doppler, Color Doppler and Intracardiac Opacification Agent  Indications:    R06.00 SOB  History:        Patient has prior history of Echocardiogram examinations and Patient has no prior history of Echocardiogram examinations. Signs/Symptoms:Chest Pain; Risk Factors:Hypertension. COVID 19 (April 2021).  Sonographer:    Clearence Ped RCS Referring Phys: [phone]  BRIAN S CRENSHAW  IMPRESSIONS   1. Left ventricular ejection fraction, by estimation, is 60 to 65%. The left ventricle has normal function. The left ventricle has no regional wall motion abnormalities. Left ventricular diastolic parameters were normal. Abnormal GLS -13.9%, but software did not track the endocardium well so likely inaccurate. 2. Right ventricular systolic function is normal. The right ventricular size is normal. There is normal pulmonary artery systolic pressure. The estimated right ventricular systolic pressure is 15.0 mmHg. 3. The aortic valve is tricuspid. Aortic valve regurgitation is not visualized. No aortic stenosis is present. 4. The  mitral valve is normal in structure. No evidence of mitral valve regurgitation. No evidence of mitral stenosis. 5. The inferior vena cava is normal in size with greater than 50% respiratory variability, suggesting right atrial pressure of 3 mmHg.  FINDINGS Left Ventricle: Left ventricular ejection fraction, by estimation, is 60 to 65%. The left ventricle has normal function. The left ventricle has no regional wall motion abnormalities. The left ventricular internal cavity size was normal in size. There is no left ventricular hypertrophy. Left ventricular diastolic parameters were normal.  Right Ventricle: The right ventricular size is normal. No increase in right ventricular wall thickness. Right ventricular systolic function is normal. There is normal pulmonary artery systolic pressure. The tricuspid regurgitant velocity is 1.73 m/s, and with an assumed right atrial pressure of 3 mmHg, the estimated right ventricular systolic pressure is 15.0 mmHg.  Left Atrium: Left atrial size was normal in size.  Right Atrium: Right atrial size was normal in size.  Pericardium: There is no evidence of pericardial effusion.  Mitral Valve: The mitral valve is normal in structure. No evidence of mitral valve regurgitation. No evidence of mitral valve stenosis.  Tricuspid Valve: The tricuspid valve is normal in structure. Tricuspid valve regurgitation is trivial.  Aortic Valve: The aortic valve is tricuspid. Aortic valve regurgitation is not visualized. No aortic stenosis is present.  Pulmonic Valve: The pulmonic valve was normal in structure. Pulmonic valve regurgitation is not visualized.  Aorta: The aortic root is normal in size and structure.  Venous: The inferior vena cava is normal in size with greater than 50% respiratory variability, suggesting right atrial pressure of 3 mmHg.  IAS/Shunts: No atrial level shunt detected by color flow Doppler.   LEFT VENTRICLE PLAX 2D LVIDd:         4.70 cm   Diastology LVIDs:         2.90 cm  LV e' lateral:   10.60 cm/s LV PW:         1.10 cm  LV E/e' lateral: 7.8 LV IVS:        0.80 cm  LV e' medial:    10.80 cm/s LVOT diam:     1.90 cm  LV E/e' medial:  7.7 LV SV:         62 LV SV Index:   29 LVOT Area:     2.84 cm   RIGHT VENTRICLE RV Basal diam:  3.40 cm RV S prime:     13.60 cm/s TAPSE (M-mode): 2.5 cm RVSP:           15.0 mmHg  LEFT ATRIUM             Index       RIGHT ATRIUM           Index LA diam:        3.80 cm 1.77 cm/m  RA Pressure: 3.00 mmHg LA Vol (A2C):  53.6 ml 24.98 ml/m RA Area:     13.90 cm LA Vol (A4C):   46.3 ml 21.58 ml/m RA Volume:   39.00 ml  18.18 ml/m LA Biplane Vol: 49.5 ml 23.07 ml/m AORTIC VALVE LVOT Vmax:   96.60 cm/s LVOT Vmean:  68.600 cm/s LVOT VTI:    0.219 m  AORTA Ao Root diam: 3.40 cm Ao Asc diam:  3.40 cm  MITRAL VALVE               TRICUSPID VALVE MV Area (PHT):             TR Peak grad:   12.0 mmHg MV Decel Time:             TR Vmax:        173.00 cm/s MV E velocity: 83.10 cm/s  Estimated RAP:  3.00 mmHg MV A velocity: 57.80 cm/s  RVSP:           15.0 mmHg MV E/A ratio:  1.44 SHUNTS Systemic VTI:  0.22 m Systemic Diam: 1.90 cm  Marca Ancona MD Electronically signed by Marca Ancona MD Signature Date/Time: 06/16/2020/3:14:22 PM    Final      CT SCANS  CT CORONARY MORPH W/CTA COR W/SCORE 06/24/2020  Addendum 06/24/2020 12:39 PM ADDENDUM REPORT: 06/24/2020 12:36  CLINICAL DATA:  Chest pain  EXAM: Cardiac/Coronary CTA  TECHNIQUE: The patient was scanned on a [employer]. A 100 kV prospective scan was triggered in the descending thoracic aorta at 111 HU's. Axial non-contrast 3 mm slices were carried out through the heart. The data set was analyzed on a dedicated work station and scored using the Agatson method. Gantry rotation speed was 250 msecs and collimation was .6 mm. 5 mg IV metoprolol and 0.8 mg of sl NTG was given. The 3D data set was  reconstructed in 5% intervals of the 35-75 % of the R-R cycle. Diastolic phases were analyzed on a dedicated work station using MPR, MIP and VRT modes. The patient received 80 cc of contrast.  FINDINGS: Image quality: excellent.  Noise artifact is: Limited.  Coronary Arteries:  Normal coronary origin.  Right dominance.  Left main: The left main is a large caliber vessel with a normal take off from the left coronary cusp that trifurcates into a LAD, LCX, and ramus intermedius. There is no plaque or stenosis.  Left anterior descending artery: The proximal LAD contains minimal mixed density plaque (<25%). The mid and distal LAD segments are patent. The LAD gives off 1 large patent diagonal branch.  Ramus intermedius: Patent with no evidence of plaque or stenosis.  Left circumflex artery: The LCX is non-dominant and patent with no evidence of plaque or stenosis. The LCX gives off 2 patent obtuse marginal branches.  Right coronary artery: The RCA is dominant with normal take off from the right coronary cusp. The proximal RCA contains minimal mixed density plaque (<25%). The mid and distal segments are patent. The RCA terminates as a PDA and right posterolateral branch without evidence of plaque or stenosis.  Right Atrium: Right atrial size is within normal limits.  Right Ventricle: The right ventricular cavity is within normal limits.  Left Atrium: Left atrial size is normal in size with no left atrial appendage filling defect.  Left Ventricle: The ventricular cavity size is within normal limits. There are no stigmata of prior infarction. There is no abnormal filling defect.  Pulmonary arteries: Normal in size without proximal filling defect.  Pulmonary veins: Normal pulmonary  venous drainage.  Pericardium: Normal thickness with no significant effusion or calcium present.  Cardiac valves: The aortic valve is trileaflet without significant calcification. The mitral valve  is normal structure without significant calcification.  Aorta: Normal caliber with no significant disease.  Extra-cardiac findings: See attached radiology report for non-cardiac structures.  IMPRESSION: 1. Coronary calcium score of 54. This was 61st percentile for age and sex matched controls.  2. Normal coronary origin with right dominance.  3. Minimal, non-obstructive CAD in the LAD/RCA (<25%).  RECOMMENDATIONS: 1. Minimal non-obstructive CAD (0-24%). Consider non-atherosclerotic causes of chest pain. Consider preventive therapy and risk factor modification.  Lennie Odor, MD   Electronically Signed By: Lennie Odor On: 06/24/2020 12:36  Narrative EXAM: OVER-READ INTERPRETATION  CT CHEST  The following report is an over-read performed by radiologist Dr. Trudie Reed of [facility] on 06/24/2020. This over-read does not include interpretation of cardiac or coronary anatomy or pathology. The coronary calcium score/coronary CTA interpretation by the cardiologist is attached.  COMPARISON:  None.  FINDINGS: Areas of ground-glass attenuation and mild septal thickening in the periphery of the lateral segment of the right middle lobe. Within the visualized portions of the thorax there are no suspicious appearing pulmonary nodules or masses, no pleural effusions, no pneumothorax and no lymphadenopathy. Visualized portions of the upper abdomen are unremarkable. There are no aggressive appearing lytic or blastic lesions noted in the visualized portions of the skeleton.  IMPRESSION: 1. Small amount of ground-glass attenuation and septal thickening in the lateral segment of the right middle lobe, which is nonspecific, but likely of infectious or inflammatory etiology.  Electronically Signed: By: Trudie Reed M.D. On: 06/24/2020 11:18     ______________________________________________________________________________________________         Current Reported Medications:.    Current Meds  Medication Sig   albuterol (PROAIR HFA) 108 (90 Base) MCG/ACT inhaler ProAir HFA 90 mcg/actuation aerosol inhaler  INHALE 1 TO 2 PUFFS EVERY 4 TO 6 HOURS AS NEEDED FOR WHEEZING   allopurinol (ZYLOPRIM) 100 MG tablet Take 100 mg by mouth daily.   amLODipine (NORVASC) 5 MG tablet Take 5 mg by mouth daily.   B Complex-C (B-COMPLEX WITH VITAMIN C) tablet Take 1 tablet by mouth daily.   budesonide-formoterol (SYMBICORT) 160-4.5 MCG/ACT inhaler Inhale 2 puffs into the lungs in the morning and at bedtime. Rinse and gargle after each use.   diphenoxylate-atropine (LOMOTIL) 2.5-0.025 MG tablet Take 1 tablet by mouth daily.   fenofibrate (TRICOR) 48 MG tablet Take 1 tablet by mouth daily.   glucosamine-chondroitin 500-400 MG tablet Take by mouth.   HYDROcodone-acetaminophen (NORCO) 10-325 MG tablet Take 1 tablet by mouth as needed.   Iron-FA-B Cmp-C-Biot-Probiotic (FUSION PLUS) CAPS Take 1 capsule by mouth daily.   losartan (COZAAR) 100 MG tablet TAKE 1 TABLET(100 MG) BY MOUTH DAILY   NON FORMULARY Cod liver Oil with Vit D - 1000 mg.   NON FORMULARY Prostate Optimizer   rosuvastatin (CRESTOR) 20 MG tablet Take 10 mg by mouth daily.    Specialty Vitamins Products (PROSTATE PO) Take 1 tablet by mouth daily.   UNABLE TO FIND Take 2 tablets by mouth daily. Med Name: [name].   Physical Exam:    VS:  BP 114/70   Pulse 69   Ht 5\' 7"  (1.702 m)   Wt 256 lb 3.2 oz (116.2 kg)   SpO2 96%   BMI 40.13 kg/m    Wt Readings from Last 3 Encounters:  12/08/23 256 lb  3.2 oz (116.2 kg)  07/23/23 244 lb 11.4 oz (111 kg)  07/12/23 244 lb 12.8 oz (111 kg)    GEN: Well nourished, well developed in no acute distress NECK: No JVD; No carotid bruits CARDIAC: RRR, no murmurs, rubs, gallops RESPIRATORY:  Clear to auscultation without rales, wheezing or rhonchi  ABDOMEN: Soft, non-tender, non-distended EXTREMITIES:  No edema; No acute deformity  Asessement  and Plan:.   Palpitations: Patient reports a few episodes waking him from sleep in which it felt Turner his heart was fluttering, lasting 45 minutes.  He reported some associated shortness of breath.  He denies any palpitations during the day.  He notes he increased his intake of potassium with some improvement.  Encourage patient to stay well-hydrated, decrease caffeine intake and decrease alcohol intake.  He will wear a 2-week ZIO monitor to assess for arrhythmias.  Check echocardiogram given palpitations and shortness of breath.  Check CBC, BMET and TSH.  Nonobstructive CAD: Coronary CTA in September 2021 revealed a coronary calcium score of 54 with minimal nonobstructive CAD.  Patient denies chest pain, notes some shortness of breath as noted below.  Check echocardiogram.  Continue rosuvastatin 20 mg daily, losartan 100 mg daily and amlodipine 5 mg daily.  Hypertension: Blood pressure today well-controlled at 114/70.  Continue amlodipine 5 mg daily, losartan 100 mg daily.  Shortness of breath/lower extremity edema: Patient with history of mixed obstructive/restrictive lung disease, monitored by pulmonology.  Patient notes that his inhalers have been adjusted with some improvement in his shortness of breath.  He does endorse some lower extremity edema that is intermittent.  Encourage patient to elevate his legs and decrease salt intake.  Check echocardiogram.  Sleep disordered breathing: Patient does reports he does snore at night.  Patient STOP-BANG 7.  He is agreeable to split-night sleep study to evaluate for OSA.    Disposition: F/u with Reather Littler, NP in 6-8 weeks.   Signed, Rip Harbour, NP

## 2023-12-08 ENCOUNTER — Encounter: Payer: Self-pay | Admitting: Cardiology

## 2023-12-08 ENCOUNTER — Ambulatory Visit: Payer: BC Managed Care – PPO | Attending: Cardiology | Admitting: Cardiology

## 2023-12-08 ENCOUNTER — Ambulatory Visit: Payer: BC Managed Care – PPO | Attending: Cardiology

## 2023-12-08 VITALS — BP 114/70 | HR 69 | Ht 67.0 in | Wt 256.2 lb

## 2023-12-08 DIAGNOSIS — R002 Palpitations: Secondary | ICD-10-CM | POA: Diagnosis not present

## 2023-12-08 DIAGNOSIS — R0602 Shortness of breath: Secondary | ICD-10-CM

## 2023-12-08 DIAGNOSIS — E7800 Pure hypercholesterolemia, unspecified: Secondary | ICD-10-CM | POA: Diagnosis not present

## 2023-12-08 DIAGNOSIS — I2510 Atherosclerotic heart disease of native coronary artery without angina pectoris: Secondary | ICD-10-CM | POA: Diagnosis not present

## 2023-12-08 DIAGNOSIS — G4730 Sleep apnea, unspecified: Secondary | ICD-10-CM

## 2023-12-08 DIAGNOSIS — R600 Localized edema: Secondary | ICD-10-CM

## 2023-12-08 DIAGNOSIS — I10 Essential (primary) hypertension: Secondary | ICD-10-CM | POA: Diagnosis not present

## 2023-12-08 NOTE — Patient Instructions (Signed)
 Medication Instructions:  No changes *If you need a refill on your cardiac medications before your next appointment, please call your pharmacy*  Lab Work: Today we are going to draw CBC, Bmet, and TSH. If you have labs (blood work) drawn today and your tests are completely normal, you will receive your results only by: MyChart Message (if you have MyChart) OR A paper copy in the mail If you have any lab test that is abnormal or we need to change your treatment, we will call you to review the results.  Testing/Procedures: Your physician has requested that you have an echocardiogram. Echocardiography is a painless test that uses sound waves to create images of your heart. It provides your doctor with information about the size and shape of your heart and how well your heart's chambers and valves are working. This procedure takes approximately one hour. There are no restrictions for this procedure. Please do NOT wear cologne, perfume, aftershave, or lotions (deodorant is allowed). Please arrive 15 minutes prior to your appointment time.  Please note: We ask at that you not bring children with you during ultrasound (echo/ vascular) testing. Due to room size and safety concerns, children are not allowed in the ultrasound rooms during exams. Our front office staff cannot provide observation of children in our lobby area while testing is being conducted. An adult accompanying a patient to their appointment will only be allowed in the ultrasound room at the discretion of the ultrasound technician under special circumstances. We apologize for any inconvenience.  Your physician has recommended that you have a split night sleep study. This test records several body functions during sleep, including: brain activity, eye movement, oxygen and carbon dioxide blood levels, heart rate and rhythm, breathing rate and rhythm, the flow of air through your mouth and nose, snoring, body muscle movements, and chest and  belly movement.  Follow-Up: At [facility], you and your health needs are our priority.  As part of our continuing mission to provide you with exceptional heart care, we have created designated Provider Care Teams.  These Care Teams include your primary Cardiologist (physician) and Advanced Practice Providers (APPs -  Physician Assistants and Nurse Practitioners) who all work together to provide you with the care you need, when you need it.  We recommend signing up for the patient portal called "MyChart".  Sign up information is provided on this After Visit Summary.  MyChart is used to connect with patients for Virtual Visits (Telemedicine).  Patients are able to view lab/test results, encounter notes, upcoming appointments, etc.  Non-urgent messages can be sent to your provider as well.   To learn more about what you can do with MyChart, go to [email or web address].    Your next appointment:   6-8 week(s)  Provider:   Reather Littler, NP    Other Instructions Andre Turner- Long Term Monitor Instructions  Your physician has requested you wear a ZIO patch monitor for 14 days.  This is a single patch monitor. [employer] supplies one patch monitor per enrollment. Additional stickers are not available. Please do not apply patch if you will be having a Nuclear Stress Test,  Echocardiogram, Cardiac CT, MRI, or Chest Xray during the period you would be wearing the  monitor. The patch cannot be worn during these tests. You cannot remove and re-apply the  ZIO XT patch monitor.  Your ZIO patch monitor will be mailed 3 day USPS to your address on file. It may take 3-5 days  to  receive your monitor after you have been enrolled.  Once you have received your monitor, please review the enclosed instructions. Your monitor  has already been registered assigning a specific monitor serial # to you.  Billing and Patient Assistance Program Information  We have supplied [employer] with any of your insurance  information on file for billing purposes. [employer] offers a sliding scale Patient Assistance Program for patients that do not have  insurance, or whose insurance does not completely cover the cost of the ZIO monitor.  You must apply for the Patient Assistance Program to qualify for this discounted rate.  To apply, please call [employer] at [phone], select option 4, select option 2, ask to apply for  Patient Assistance Program. Meredeth Ide will ask your household income, and how many people  are in your household. They will quote your out-of-pocket cost based on that information.  [employer] will also be able to set up a 62-month, interest-free payment plan if needed.  Applying the monitor   Shave hair from upper left chest.  Hold abrader disc by orange tab. Rub abrader in 40 strokes over the upper left chest as  indicated in your monitor instructions.  Clean area with 4 enclosed alcohol pads. Let dry.  Apply patch as indicated in monitor instructions. Patch will be placed under collarbone on left  side of chest with arrow pointing upward.  Rub patch adhesive wings for 2 minutes. Remove white label marked "1". Remove the white  label marked "2". Rub patch adhesive wings for 2 additional minutes.  While looking in a mirror, press and release button in center of patch. A small green light will  flash 3-4 times. This will be your only indicator that the monitor has been turned on.  Do not shower for the first 24 hours. You may shower after the first 24 hours.  Press the button if you feel a symptom. You will hear a small click. Record Date, Time and  Symptom in the Patient Logbook.  When you are ready to remove the patch, follow instructions on the last 2 pages of Patient  Logbook. Stick patch monitor onto the last page of Patient Logbook.  Place Patient Logbook in the blue and white box. Use locking tab on box and tape box closed  securely. The blue and white box has prepaid postage on it. Please  place it in the mailbox as  soon as possible. Your physician should have your test results approximately 7 days after the  monitor has been mailed back to [facility].  Call [facility] Customer Care at [phone] if you have questions regarding  your ZIO XT patch monitor. Call them immediately if you see an orange light blinking on your  monitor.  If your monitor falls off in less than 4 days, contact our Monitor department at [phone].  If your monitor becomes loose or falls off after 4 days call [employer] at [phone] for  suggestions on securing your monitor

## 2023-12-08 NOTE — Progress Notes (Unsigned)
Enrolled patient for a 14 day Zio XT monitor to be mailed to patients home  [place] to read

## 2023-12-09 LAB — CBC
Hematocrit: 38.2 % (ref 37.5–51.0)
Hemoglobin: 13.1 g/dL (ref 13.0–17.7)
MCH: 32.4 pg (ref 26.6–33.0)
MCHC: 34.3 g/dL (ref 31.5–35.7)
MCV: 95 fL (ref 79–97)
Platelets: 285 10*3/uL (ref 150–450)
RBC: 4.04 x10E6/uL — ABNORMAL LOW (ref 4.14–5.80)
RDW: 12.1 % (ref 11.6–15.4)
WBC: 9.8 10*3/uL (ref 3.4–10.8)

## 2023-12-09 LAB — BASIC METABOLIC PANEL
BUN/Creatinine Ratio: 22 (ref 10–24)
BUN: 24 mg/dL (ref 8–27)
CO2: 23 mmol/L (ref 20–29)
Calcium: 9.5 mg/dL (ref 8.6–10.2)
Chloride: 101 mmol/L (ref 96–106)
Creatinine, Ser: 1.07 mg/dL (ref 0.76–1.27)
Glucose: 82 mg/dL (ref 70–99)
Potassium: 4.4 mmol/L (ref 3.5–5.2)
Sodium: 139 mmol/L (ref 134–144)
eGFR: 78 mL/min/{1.73_m2} (ref >59)

## 2023-12-09 LAB — TSH: TSH: 2.69 u[IU]/mL (ref 0.450–4.500)

## 2023-12-16 DIAGNOSIS — R600 Localized edema: Secondary | ICD-10-CM | POA: Diagnosis not present

## 2023-12-16 DIAGNOSIS — R002 Palpitations: Secondary | ICD-10-CM

## 2023-12-22 ENCOUNTER — Telehealth: Payer: Self-pay

## 2023-12-22 ENCOUNTER — Encounter: Payer: Self-pay | Admitting: Emergency Medicine

## 2023-12-22 ENCOUNTER — Ambulatory Visit: Payer: BC Managed Care – PPO | Admitting: Emergency Medicine

## 2023-12-22 VITALS — BP 128/60 | HR 82 | Ht 67.0 in | Wt 264.0 lb

## 2023-12-22 DIAGNOSIS — R002 Palpitations: Secondary | ICD-10-CM

## 2023-12-22 DIAGNOSIS — Z01811 Encounter for preprocedural respiratory examination: Secondary | ICD-10-CM

## 2023-12-22 DIAGNOSIS — J4520 Mild intermittent asthma, uncomplicated: Secondary | ICD-10-CM

## 2023-12-22 DIAGNOSIS — J984 Other disorders of lung: Secondary | ICD-10-CM

## 2023-12-22 DIAGNOSIS — R9389 Abnormal findings on diagnostic imaging of other specified body structures: Secondary | ICD-10-CM

## 2023-12-22 DIAGNOSIS — R0683 Snoring: Secondary | ICD-10-CM

## 2023-12-22 NOTE — Assessment & Plan Note (Signed)
 Subtle groundglass infiltrates in the left upper lobe, right middle lobe of unclear etiology.  We have followed and they have been stable.  Plan for his next CT in April of this year.  I will follow-up with him after to review.

## 2023-12-22 NOTE — Assessment & Plan Note (Signed)
 We reviewed your studies and your clinical status today.  Your asthma and your suspected obstructive sleep apnea puts you at moderately increased risk for general anesthesia and surgery.  That said there is nothing to preclude you from having your knee surgery as long as the benefits outweigh these risks.  You will need to stay on your asthma medications.  Attention should be given to the fact that you may have sleep apnea as they recover you from general anesthesia.

## 2023-12-22 NOTE — Assessment & Plan Note (Signed)
Continue Symbicort 2 puffs twice a day.  Rinse and gargle after using. Keep albuterol available to use 2 puffs up to every 4 hours if needed for shortness of breath, chest tightness, wheezing.

## 2023-12-22 NOTE — Patient Instructions (Signed)
 We reviewed your studies and your clinical status today.  Your asthma and your suspected obstructive sleep apnea puts you at moderately increased risk for general anesthesia and surgery.  That said there is nothing to preclude you from having your knee surgery as long as the benefits outweigh these risks.  You will need to stay on your asthma medications.  Attention should be given to the fact that you may have sleep apnea as they recover you from general anesthesia. Continue Symbicort 2 puffs twice a day.  Rinse and gargle after using Keep albuterol available to use 2 puffs up to every 4 hours if needed for shortness of breath, chest tightness, wheezing.  Continue to follow with cardiology as planned Agree with sleep study as planned Get your repeat CT scan of the chest in April as planned. Follow with Andre Turner in April after your CT chest so we can review those results together.

## 2023-12-22 NOTE — Telephone Encounter (Signed)
 I have faxed pt's surgical clearance papers signed by Dr. Delton Coombes to fax number [phone]. Nothing further needed.

## 2023-12-22 NOTE — Assessment & Plan Note (Signed)
 Currently under evaluation by cardiology.  Holter monitor planned

## 2023-12-22 NOTE — Progress Notes (Signed)
   Subjective:    Patient ID: Andre Turner, male    DOB: 12-13-1960, 63 y.o.   MRN: [id number]  HPI  ROV 12/22/2023 --Mr. Hayne is 51 with a minimal tobacco history but asthmatic COPD.  Also with rheumatoid arthritis, hypertension, GERD and allergic rhinitis.  We have been following an area of subtle focal groundglass infiltrate in the left upper lobe and right middle lobe of unclear cause on serial imaging.  He had a COPD exacerbation in December and was treated in urgent care.  We had planned to repeat his CT scan of the chest in April to follow his pulmonary infiltrates. Currently managed on Symbicort. Uses albuterol a few times a month. He has congestion and cough every morning, clear mucous.  He is planning for knee surgery to be done in May.  Today he reports that he has ups and downs w his breathing - sometimes has SOB w housework, sometimes assoc w wheeze but not always. He has been having palpitations and is being evaluated by Cards w a monitor. He snores and there is a plan for a PSG, to be done soon. He does not take the flu shot.     Review of Systems As per HPI     Objective:   Physical Exam Vitals:   12/22/23 1138  BP: 128/60  Pulse: 82  SpO2: 95%  Weight: 264 lb (119.7 kg)  Height: 5\' 7"  (1.702 m)   Gen: Pleasant, obese man, in no distress,  normal affect, some intermittent cough  ENT: No lesions,  mouth clear,  oropharynx clear, no postnasal drip  Neck: No JVD, no stridor, strong voice  Lungs: No use of accessory muscles, few scattered rhonchi, right greater than left, no crackles or wheezes  Cardiovascular: RRR, heart sounds normal, no murmur or gallops  Musculoskeletal: No deformities, no cyanosis or clubbing  Neuro: alert, awake, non focal  Skin: Warm, no lesions or rash      Assessment & Plan:  Preoperative respiratory examination We reviewed your studies and your clinical status today.  Your asthma and your suspected obstructive sleep  apnea puts you at moderately increased risk for general anesthesia and surgery.  That said there is nothing to preclude you from having your knee surgery as long as the benefits outweigh these risks.  You will need to stay on your asthma medications.  Attention should be given to the fact that you may have sleep apnea as they recover you from general anesthesia.  Abnormal CT of the chest Subtle groundglass infiltrates in the left upper lobe, right middle lobe of unclear etiology.  We have followed and they have been stable.  Plan for his next CT in April of this year.  I will follow-up with him after to review.  Restrictive lung disease Due to obesity  Mild intermittent asthma Continue Symbicort 2 puffs twice a day.  Rinse and gargle after using Keep albuterol available to use 2 puffs up to every 4 hours if needed for shortness of breath, chest tightness, wheezing.    Palpitations Currently under evaluation by cardiology.  Holter monitor planned  Snoring Snoring and body habitus consistent with OSA.  A PSG has been planned by cardiology.  Will follow     Levy Pupa, MD, PhD 12/22/2023, 12:11 PM [facility] [phone] or if no answer [phone]

## 2023-12-22 NOTE — Assessment & Plan Note (Signed)
 Due to obesity

## 2023-12-22 NOTE — Assessment & Plan Note (Signed)
 Snoring and body habitus consistent with OSA.  A PSG has been planned by cardiology.  Will follow

## 2024-01-08 ENCOUNTER — Telehealth: Payer: Self-pay | Admitting: Hematology

## 2024-01-08 NOTE — Telephone Encounter (Signed)
 Patient left a voicemail to schedule with a provider that is seeing Dr. Judeth Porch patients. I scheduled the established patient to see Dr. Mosetta Putt.

## 2024-01-09 ENCOUNTER — Ambulatory Visit (HOSPITAL_COMMUNITY)
Admission: RE | Admit: 2024-01-09 | Discharge: 2024-01-09 | Disposition: A | Discharge status: Home / Self Care | Payer: BC Managed Care – PPO | Source: Ambulatory Visit | Attending: Cardiology | Admitting: Cardiology

## 2024-01-09 DIAGNOSIS — R600 Localized edema: Secondary | ICD-10-CM | POA: Diagnosis present

## 2024-01-09 DIAGNOSIS — R0609 Other forms of dyspnea: Secondary | ICD-10-CM | POA: Diagnosis not present

## 2024-01-09 DIAGNOSIS — R002 Palpitations: Secondary | ICD-10-CM | POA: Diagnosis present

## 2024-01-09 LAB — ECHOCARDIOGRAM COMPLETE
AR max vel: 2.95 cm2
AV Area VTI: 2.76 cm2
AV Area mean vel: 2.94 cm2
AV Mean grad: 4 mmHg
AV Peak grad: 7.6 mmHg
Ao pk vel: 1.38 m/s
Area-P 1/2: 3.06 cm2
S' Lateral: 2.91 cm

## 2024-01-09 MED ORDER — PERFLUTREN LIPID MICROSPHERE
1.0000 mL | INTRAVENOUS | Status: AC | PRN
Start: 2024-01-09 — End: 2024-01-09
  Administered 2024-01-09: 4 mL via INTRAVENOUS

## 2024-01-15 ENCOUNTER — Other Ambulatory Visit: Payer: Self-pay | Admitting: Hematology

## 2024-01-15 ENCOUNTER — Telehealth: Payer: Self-pay

## 2024-01-15 ENCOUNTER — Other Ambulatory Visit: Payer: Self-pay

## 2024-01-15 DIAGNOSIS — D72820 Lymphocytosis (symptomatic): Secondary | ICD-10-CM

## 2024-01-15 DIAGNOSIS — D500 Iron deficiency anemia secondary to blood loss (chronic): Secondary | ICD-10-CM

## 2024-01-15 NOTE — Telephone Encounter (Signed)
 Called patient advised of below they verbalized understanding.

## 2024-01-15 NOTE — Telephone Encounter (Signed)
-----   Message from Rip Harbour sent at 01/14/2024  5:35 PM EDT ----- Please let Andre Turner know that his cardiac monitor showed an average heart rate of 83 bpm.  His predominant heart rhythm was normal sinus rhythm.  There were 3 short episodes of fast heartbeats that originated from the top chambers of the heart.  His triggered events were associated with normal sinus rhythm or with faster heart beats.  Overall good results, we will discuss more at follow-up.

## 2024-01-16 ENCOUNTER — Inpatient Hospital Stay: Attending: Hematology

## 2024-01-16 ENCOUNTER — Inpatient Hospital Stay (HOSPITAL_BASED_OUTPATIENT_CLINIC_OR_DEPARTMENT_OTHER): Admitting: Hematology

## 2024-01-16 ENCOUNTER — Ambulatory Visit
Admission: RE | Admit: 2024-01-16 | Discharge: 2024-01-16 | Disposition: A | Discharge status: Home / Self Care | Payer: BC Managed Care – PPO | Source: Ambulatory Visit | Attending: Emergency Medicine | Admitting: Emergency Medicine

## 2024-01-16 ENCOUNTER — Encounter: Payer: Self-pay | Admitting: Hematology

## 2024-01-16 VITALS — BP 129/75 | HR 81 | Temp 98.0°F | Resp 17 | Ht 67.0 in | Wt 265.8 lb

## 2024-01-16 DIAGNOSIS — D500 Iron deficiency anemia secondary to blood loss (chronic): Secondary | ICD-10-CM

## 2024-01-16 DIAGNOSIS — D72829 Elevated white blood cell count, unspecified: Secondary | ICD-10-CM | POA: Insufficient documentation

## 2024-01-16 DIAGNOSIS — M1711 Unilateral primary osteoarthritis, right knee: Secondary | ICD-10-CM | POA: Diagnosis not present

## 2024-01-16 DIAGNOSIS — I10 Essential (primary) hypertension: Secondary | ICD-10-CM | POA: Insufficient documentation

## 2024-01-16 DIAGNOSIS — D509 Iron deficiency anemia, unspecified: Secondary | ICD-10-CM | POA: Diagnosis not present

## 2024-01-16 DIAGNOSIS — M549 Dorsalgia, unspecified: Secondary | ICD-10-CM | POA: Diagnosis not present

## 2024-01-16 DIAGNOSIS — R9389 Abnormal findings on diagnostic imaging of other specified body structures: Secondary | ICD-10-CM

## 2024-01-16 DIAGNOSIS — G8929 Other chronic pain: Secondary | ICD-10-CM | POA: Diagnosis not present

## 2024-01-16 DIAGNOSIS — D72820 Lymphocytosis (symptomatic): Secondary | ICD-10-CM

## 2024-01-16 LAB — CBC WITH DIFFERENTIAL (CANCER CENTER ONLY)
Abs Immature Granulocytes: 0.02 10*3/uL (ref 0.00–0.07)
Basophils Absolute: 0.1 10*3/uL (ref 0.0–0.1)
Basophils Relative: 1 %
Eosinophils Absolute: 0.2 10*3/uL (ref 0.0–0.5)
Eosinophils Relative: 2 %
HCT: 40.2 % (ref 39.0–52.0)
Hemoglobin: 14.2 g/dL (ref 13.0–17.0)
Immature Granulocytes: 0 %
Lymphocytes Relative: 53 %
Lymphs Abs: 6.2 10*3/uL — ABNORMAL HIGH (ref 0.7–4.0)
MCH: 31.9 pg (ref 26.0–34.0)
MCHC: 35.3 g/dL (ref 30.0–36.0)
MCV: 90.3 fL (ref 80.0–100.0)
Monocytes Absolute: 0.6 10*3/uL (ref 0.1–1.0)
Monocytes Relative: 5 %
Neutro Abs: 4.5 10*3/uL (ref 1.7–7.7)
Neutrophils Relative %: 39 %
Platelet Count: 250 10*3/uL (ref 150–400)
RBC: 4.45 MIL/uL (ref 4.22–5.81)
RDW: 12.4 % (ref 11.5–15.5)
Smear Review: NORMAL
WBC Count: 11.7 10*3/uL — ABNORMAL HIGH (ref 4.0–10.5)
nRBC: 0 % (ref 0.0–0.2)

## 2024-01-16 LAB — CMP (CANCER CENTER ONLY)
ALT: 26 U/L (ref 0–44)
AST: 26 U/L (ref 15–41)
Albumin: 4.6 g/dL (ref 3.5–5.0)
Alkaline Phosphatase: 75 U/L (ref 38–126)
Anion gap: 6 (ref 5–15)
BUN: 28 mg/dL — ABNORMAL HIGH (ref 8–23)
CO2: 25 mmol/L (ref 22–32)
Calcium: 9.3 mg/dL (ref 8.9–10.3)
Chloride: 103 mmol/L (ref 98–111)
Creatinine: 1.32 mg/dL — ABNORMAL HIGH (ref 0.61–1.24)
GFR, Estimated: >60 mL/min (ref >60)
Glucose, Bld: 105 mg/dL — ABNORMAL HIGH (ref 70–99)
Potassium: 4 mmol/L (ref 3.5–5.1)
Sodium: 134 mmol/L — ABNORMAL LOW (ref 135–145)
Total Bilirubin: 0.7 mg/dL (ref 0.0–1.2)
Total Protein: 7.5 g/dL (ref 6.5–8.1)

## 2024-01-16 NOTE — Progress Notes (Signed)
 [facility]   [phone] [phone]   Clinic Follow up Note   Patient Care Team: Jim Like, NP as PCP - General (Family Medicine) Jens Som Madolyn Frieze, MD as PCP - Cardiology (Cardiology) Verlin Dike., MD as Surgeon (Sports Medicine) Meryl Dare, MD (Inactive) as Consulting Physician (Gastroenterology)  Date of Service:  01/16/2024  CHIEF COMPLAINT: f/u of lymphocytosis and anemia  CURRENT THERAPY:  Oral iron  Assessment & Plan Leukocytosis Leukocytosis with a white blood cell count of 11.7, particularly elevated lymphocytes. Previous tests for chronic lymphocytic leukemia (CLL) were negative. No lymphadenopathy or significant symptoms present. - Repeat specific test for chronic lymphocytic leukemia - Follow up in 6 months for repeat lab tests  Iron deficiency anemia Anemia resolved with oral iron supplementation. Current hemoglobin is 14.2. Plan to reduce iron supplementation frequency. -His upper endoscopy and colonoscopy last year was negative for malignancy or clear source of bleeding. - Reduce iron pill intake to 2-3 times a week - Monitor hemoglobin levels  Knee osteoarthritis (pre-surgical evaluation) Scheduled for right knee replacement on May 14. Blood counts need to be stable before surgery. Coordination with orthopedic surgeon required. - Send a note to Dr. Eulogio Ditch, the orthopedic surgeon, regarding hematological status  Hypertension Hypertension is being monitored. No changes in management were discussed.  Chronic back pain Chronic back pain due to three flattened discs and one bulging disc, affecting mobility and leading to disability filing. No changes in management were discussed.  Plan -Lab reviewed, anemia resolved.  WBC 11.7, differential is still pending.  CLL flow cytometry panel result pending -Okay to reduce oral iron to 2-3 times a week, will repeat iron study on next visit -Lab in 6 months -Lab and  follow-up in 1 year -He is cleared for knee surgery from hematology standpoint.       Discussed the use of AI scribe software for clinical note transcription with the patient, who gave verbal consent to proceed.  History of Present Illness Andre Turner, a 63 year old gentleman with a history of hypertension, hyperlipidemia, edema, neuropathy, and prediabetes, presents for follow-up of leukocytosis. He was previously seen by Dr. Trudi Ida for the same concern. The patient reports that his white blood cell count, particularly his lymphocyte count, was slightly elevated in the past, but not consistently. He is scheduled for a knee replacement surgery on May 14th and is concerned about his blood counts. He also reports having a slight issue with his prostate and occasional restless leg syndrome. He has a history of tonsillectomy, knee surgery, hernia repair, and colonoscopy. He has a family history of lung cancer in his mother and prostate cancer in his grandfather. He used to smoke in his teenage years and drinks alcohol occasionally. He is currently not working due to health issues, including back problems with three flattened discs and one bulging disc, and edema in his legs. He is also overweight and has been advised to lose weight. He is currently taking oral iron for anemia.     All other systems were reviewed with the patient and are negative.  MEDICAL HISTORY:  Past Medical History:  Diagnosis Date   Allergic rhinitis    Anemia    Arthritis    rhuematoid and osteo arthritis   Cataract    Chronic headache    migraines   Diverticulitis    GERD (gastroesophageal reflux disease)    History of benign prostatic hypertrophy    Hyperlipidemia    Hypertension  Restless leg    Tricuspid regurgitation    Unspecified asthma(493.90)     SURGICAL HISTORY: Past Surgical History:  Procedure Laterality Date   COLONOSCOPY     INGUINAL HERNIA REPAIR     KNEE ARTHROSCOPY Right 11/18/2022    PATELLA-FEMORAL ARTHROPLASTY Left 07/14/2016   Procedure: LEFT KNEE PATELLA-FEMORAL  REPLACEMENT ARTHROPLASTY;  Surgeon: Loreta Ave, MD;  Location: [facility];  Service: Orthopedics;  Laterality: Left;   REPLACEMENT TOTAL KNEE Left 01/24/2022   TENDON REPAIR     TONSILLECTOMY      I have reviewed the social history and family history with the patient and they are unchanged from previous note.  ALLERGIES:  is allergic to lisinopril-hydrochlorothiazide, meloxicam, metoprolol, celebrex [celecoxib], duloxetine, percocet [oxycodone-acetaminophen], sulfa antibiotics, sulfonamide derivatives, tetracycline, and latex.  MEDICATIONS:  Current Outpatient Medications  Medication Sig Dispense Refill   albuterol (PROAIR HFA) 108 (90 Base) MCG/ACT inhaler ProAir HFA 90 mcg/actuation aerosol inhaler  INHALE 1 TO 2 PUFFS EVERY 4 TO 6 HOURS AS NEEDED FOR WHEEZING 8 g 0   allopurinol (ZYLOPRIM) 100 MG tablet Take 100 mg by mouth daily.     amLODipine (NORVASC) 5 MG tablet Take 5 mg by mouth daily.     B Complex-C (B-COMPLEX WITH VITAMIN C) tablet Take 1 tablet by mouth daily.     budesonide-formoterol (SYMBICORT) 160-4.5 MCG/ACT inhaler Inhale 2 puffs into the lungs in the morning and at bedtime. Rinse and gargle after each use. 1 each 12   diphenoxylate-atropine (LOMOTIL) 2.5-0.025 MG tablet Take 1 tablet by mouth daily.     fenofibrate (TRICOR) 48 MG tablet Take 1 tablet by mouth daily.     glucosamine-chondroitin 500-400 MG tablet Take by mouth.     HYDROcodone-acetaminophen (NORCO) 10-325 MG tablet Take 1 tablet by mouth as needed. (Patient not taking: Reported on 12/22/2023)     Iron-FA-B Cmp-C-Biot-Probiotic (FUSION PLUS) CAPS Take 1 capsule by mouth daily.     losartan (COZAAR) 100 MG tablet TAKE 1 TABLET(100 MG) BY MOUTH DAILY 90 tablet 2   NON FORMULARY Cod liver Oil with Vit D - 1000 mg.     NON FORMULARY Prostate Optimizer     rosuvastatin (CRESTOR) 20 MG tablet Take 10 mg by  mouth daily.      Specialty Vitamins Products (PROSTATE PO) Take 1 tablet by mouth daily.     UNABLE TO FIND Take 2 tablets by mouth daily. Med Name: [name].     No current facility-administered medications for this visit.    PHYSICAL EXAMINATION: ECOG PERFORMANCE STATUS: 1 - Symptomatic but completely ambulatory  Vitals:   01/16/24 1519  BP: 129/75  Pulse: 81  Resp: 17  Temp: 98 F (36.7 C)  SpO2: 98%   Wt Readings from Last 3 Encounters:  01/16/24 265 lb 12.8 oz (120.6 kg)  12/22/23 264 lb (119.7 kg)  12/08/23 256 lb 3.2 oz (116.2 kg)     GENERAL:alert, no distress and comfortable, obese male  SKIN: skin color, texture, turgor are normal, no rashes or significant lesions EYES: normal, Conjunctiva are pink and non-injected, sclera clear NECK: supple, thyroid normal size, non-tender, without nodularity LYMPH:  no palpable lymphadenopathy in the cervical, axillary  LUNGS: clear to auscultation and percussion with normal breathing effort HEART: regular rate & rhythm and no murmurs and no lower extremity edema ABDOMEN:abdomen soft, non-tender and normal bowel sounds Musculoskeletal:no cyanosis of digits and no clubbing  NEURO: alert & oriented x 3 with fluent speech,  no focal motor/sensory deficits  Physical Exam    LABORATORY DATA:  I have reviewed the data as listed    Latest Ref Rng & Units 01/16/2024    2:58 PM 12/08/2023    3:17 PM 07/23/2023    3:06 PM  CBC  WBC 4.0 - 10.5 K/uL 11.7  9.8  11.9   Hemoglobin 13.0 - 17.0 g/dL 21.3  08.6  57.8   Hematocrit 39.0 - 52.0 % 40.2  38.2  40.7   Platelets 150 - 400 K/uL 250  285  255         Latest Ref Rng & Units 01/16/2024    2:58 PM 12/08/2023    3:17 PM 07/23/2023    3:06 PM  CMP  Glucose 70 - 99 mg/dL 469  82  86   BUN 8 - 23 mg/dL 28  24  22    Creatinine 0.61 - 1.24 mg/dL 6.29  5.28  4.13   Sodium 135 - 145 mmol/L 134  139  135   Potassium 3.5 - 5.1 mmol/L 4.0  4.4  4.0   Chloride 98 - 111 mmol/L 103   101  101   CO2 22 - 32 mmol/L 25  23  24    Calcium 8.9 - 10.3 mg/dL 9.3  9.5  9.1   Total Protein 6.5 - 8.1 g/dL 7.5     Total Bilirubin 0.0 - 1.2 mg/dL 0.7     Alkaline Phos 38 - 126 U/L 75     AST 15 - 41 U/L 26     ALT 0 - 44 U/L 26         RADIOGRAPHIC STUDIES: I have personally reviewed the radiological images as listed and agreed with the findings in the report. No results found.    Orders Placed This Encounter  Procedures   Ferritin    Standing Status:   Standing    Number of Occurrences:   20    Expiration Date:   01/15/2025   CBC with Differential/Platelet    Standing Status:   Standing    Number of Occurrences:   50    Expiration Date:   01/15/2025   All questions were answered. The patient knows to call the clinic with any problems, questions or concerns. No barriers to learning was detected. The total time spent in the appointment was 30 minutes.     Malachy Mood, MD 01/16/2024

## 2024-01-17 LAB — SURGICAL PATHOLOGY

## 2024-01-18 LAB — FLOW CYTOMETRY

## 2024-01-28 NOTE — Progress Notes (Unsigned)
 Cardiology Office Note    Date:  01/30/2024  ID:  Andre Turner, DOB [birth year], MRN [id number] PCP:  Lindy Rhyme, NP  Cardiologist:  Alexandria Angel, MD  Electrophysiologist:  None   Chief Complaint: Preoperative cardiac exam   History of Present Illness: .    Andre Turner is a 63 y.o. male with visit-pertinent history of CAD with cardiac catheterization in October 2012 revealing normal LV function and 20% right coronary artery stenosis.  Echocardiogram in September 2021 indicated normal LV function and no significant valvular disease.  Coronary CTA in September 2021 revealed a coronary calcium score of 54 with minimal nonobstructive CAD.  Patient also has history of restrictive lung disease and hyperlipidemia.  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  Patient was seen in clinic by Dr. Audery Blazing on 06/20/2022.  He remained stable from a cardiac standpoint.  On 12/08/2023 patient presented for follow-up and regarding increased palpitations.  Patient reported a few episodes of palpitations at night that had woken him from sleep.  He noted that he would wake up and feels that his heart was fluttering in his chest with some associated shortness of breath, episodes lasting 45 minutes.  Patient denied any dizziness, lightheadedness, presyncope or syncope.  Patient reported he started eating more avocados and took a potassium supplement with improvement in symptoms.  2-week cardiac monitor and echocardiogram was recommended.  Cardiac monitor worn for 13 days and 23 hours indicated predominant underlying rhythm was sinus rhythm, average heart rate 83 bpm ranging from 52 to 188 bpm.  He had 3 runs of supraventricular tachycardia, run with the fastest interval lasting 5 beats with a maximum rate of 188 bpm, longest lasting 4 beats with an average rate of 138 bpm.  Rare PACs and rare PVCs.  Symptoms were  associated with sinus and sinus tachycardia.  Echocardiogram 01/09/2024 indicated LVEF of 60 to 65%, no RWMA, mild LVH, diastolic parameters were normal, RV systolic function and size was normal, no significant valvular abnormalities.  Today he is doing very well overall. He denies any chest pain, shorntess of breath, orthopnea or pnd.  Patient notes that he has some mild bilateral lower extremity edema after standing on his feet all day, improves with elevation overnight.  He reports that his palpitations have improved, notes some on occasion however they are overall not bothersome, reviewed his cardiac monitor as noted above.  Patient reports that he is planning to undergo knee replacement next month, he is able to meet greater than 4 METS of activity. ROS: .   Today he denies chest pain, shortness of breath, fatigue, melena, hematuria, hemoptysis, diaphoresis, weakness, presyncope, syncope, orthopnea, and PND.  All other systems are reviewed and otherwise negative. Studies Reviewed: Aaron Aas    EKG:  EKG is not ordered today, EKG reviewed from 12/08/2023 indicating normal sinus rhythm. CV Studies: Cardiac studies reviewed are outlined and summarized above. Otherwise please see EMR for full report. Cardiac Studies & Procedures   ______________________________________________________________________________________________     ECHOCARDIOGRAM  ECHOCARDIOGRAM COMPLETE 01/09/2024  Narrative ECHOCARDIOGRAM REPORT    Patient Name:   Andre Turner [facility] Date of Exam: 01/09/2024 Medical Rec #:  [id number]                 Height:       67.0 in Accession #:    [id number]  Weight:       264.0 lb Date of Birth:  12-10-1960                 BSA:          2.276 m Patient Age:    62 years                  BP:           114/70 mmHg Patient Gender: M                         HR:           72 bpm. Exam Location:  Outpatient  Procedure: 2D Echo, Cardiac Doppler, Color Doppler and  Intracardiac Opacification Agent (Both Spectral and Color Flow Doppler were utilized during procedure).  Indications:    R06.9 DOE; R60.0 Lower extremity edema  History:        Patient has prior history of Echocardiogram examinations, most recent 06/16/2020. Signs/Symptoms:Dyspnea and Edema; Risk Factors:Hypertension, Dyslipidemia and Former Smoker. Patient denies chest pain. He does have DOE with right leg edema.  Sonographer:    Richarda Chance RVT, RDCS (AE), RDMS Referring Phys: [phone] [facility] D Stella Bortle   Sonographer Comments: Suboptimal parasternal window, suboptimal apical window and patient is obese. Image acquisition challenging due to patient body habitus. IMPRESSIONS   1. Left ventricular ejection fraction, by estimation, is 60 to 65%. The left ventricle has normal function. The left ventricle has no regional wall motion abnormalities. There is mild left ventricular hypertrophy. Left ventricular diastolic parameters were normal. 2. Right ventricular systolic function is normal. The right ventricular size is normal. Tricuspid regurgitation signal is inadequate for assessing PA pressure. 3. The mitral valve is normal in structure. No evidence of mitral valve regurgitation. No evidence of mitral stenosis. 4. The aortic valve is tricuspid. Aortic valve regurgitation is not visualized. No aortic stenosis is present. 5. The inferior vena cava is normal in size with greater than 50% respiratory variability, suggesting right atrial pressure of 3 mmHg.  Comparison(s): EF 60%, GLS -13.9%.  FINDINGS Left Ventricle: Left ventricular ejection fraction, by estimation, is 60 to 65%. The left ventricle has normal function. The left ventricle has no regional wall motion abnormalities. Definity  contrast agent was given IV to delineate the left ventricular endocardial borders. The left ventricular internal cavity size was normal in size. There is mild left ventricular hypertrophy. Left ventricular  diastolic parameters were normal.  Right Ventricle: The right ventricular size is normal. Right vetricular wall thickness was not well visualized. Right ventricular systolic function is normal. Tricuspid regurgitation signal is inadequate for assessing PA pressure.  Left Atrium: Left atrial size was normal in size.  Right Atrium: Right atrial size was normal in size.  Pericardium: There is no evidence of pericardial effusion.  Mitral Valve: The mitral valve is normal in structure. No evidence of mitral valve regurgitation. No evidence of mitral valve stenosis.  Tricuspid Valve: The tricuspid valve is not well visualized. Tricuspid valve regurgitation is not demonstrated. No evidence of tricuspid stenosis.  Aortic Valve: The aortic valve is tricuspid. Aortic valve regurgitation is not visualized. No aortic stenosis is present. Aortic valve mean gradient measures 4.0 mmHg. Aortic valve peak gradient measures 7.6 mmHg. Aortic valve area, by VTI measures 2.76 cm.  Pulmonic Valve: The pulmonic valve was not well visualized. Pulmonic valve regurgitation is not visualized. No evidence of pulmonic stenosis.  Aorta: The aortic root and  ascending aorta are structurally normal, with no evidence of dilitation.  Venous: The inferior vena cava is normal in size with greater than 50% respiratory variability, suggesting right atrial pressure of 3 mmHg.  IAS/Shunts: No atrial level shunt detected by color flow Doppler.   LEFT VENTRICLE PLAX 2D LVIDd:         4.97 cm   Diastology LVIDs:         2.91 cm   LV e' medial:    11.20 cm/s LV PW:         1.10 cm   LV E/e' medial:  9.6 LV IVS:        1.10 cm   LV e' lateral:   12.30 cm/s LVOT diam:     2.17 cm   LV E/e' lateral: 8.8 LV SV:         80 LV SV Index:   35 LVOT Area:     3.70 cm   RIGHT VENTRICLE RV S prime:     17.10 cm/s TAPSE (M-mode): 2.4 cm  LEFT ATRIUM             Index        RIGHT ATRIUM           Index LA diam:        3.92 cm  1.72 cm/m   RA Area:     18.20 cm LA Vol (A2C):   67.9 ml 29.84 ml/m  RA Volume:   57.00 ml  25.05 ml/m LA Vol (A4C):   78.7 ml 34.59 ml/m LA Biplane Vol: 76.9 ml 33.79 ml/m AORTIC VALVE                    PULMONIC VALVE AV Area (Vmax):    2.95 cm     PV Vmax:       0.75 m/s AV Area (Vmean):   2.94 cm     PV Peak grad:  2.2 mmHg AV Area (VTI):     2.76 cm AV Vmax:           138.00 cm/s AV Vmean:          90.400 cm/s AV VTI:            0.289 m AV Peak Grad:      7.6 mmHg AV Mean Grad:      4.0 mmHg LVOT Vmax:         110.00 cm/s LVOT Vmean:        71.900 cm/s LVOT VTI:          0.216 m LVOT/AV VTI ratio: 0.75  AORTA Ao Root diam: 3.69 cm Ao Asc diam:  3.58 cm Ao Arch diam: 3.5 cm  MITRAL VALVE MV Area (PHT): 3.06 cm     SHUNTS MV Decel Time: 248 msec     Systemic VTI:  0.22 m MV E velocity: 108.00 cm/s  Systemic Diam: 2.17 cm MV A velocity: 95.40 cm/s MV E/A ratio:  1.13  Armida Lander MD Electronically signed by Armida Lander MD Signature Date/Time: 01/09/2024/3:58:12 PM    Final    MONITORS  LONG TERM MONITOR (3-14 DAYS) 01/11/2024  Narrative Patch Wear Time:  13 days and 23 hours (2025-03-08T10:13:23-0500 to 2025-03-22T11:13:15-0400)  Patient had a min HR of 52 bpm, max HR of 188 bpm, and avg HR of 83 bpm. Predominant underlying rhythm was Sinus Rhythm. 3 Supraventricular Tachycardia runs occurred, the run with the fastest interval lasting 5 beats with a max rate  of 188 bpm, the longest lasting 4 beats with an avg rate of 138 bpm. Isolated SVEs were rare (<1.0%), SVE Couplets were rare (<1.0%), and SVE Triplets were rare (<1.0%). Isolated VEs were rare (<1.0%), and no VE Couplets or VE Triplets were present. Ventricular Trigeminy was present.   Sinus bradycardia, NSR, sinus tachycardia, rare PACs, 4 and 5 beat runs of SVT, rare PVC. Symptoms associated with sinus and sinus tachycardia Alexandria Angel   CT SCANS  CT CORONARY MORPH W/CTA COR W/SCORE  06/24/2020  Addendum 06/24/2020 12:39 PM ADDENDUM REPORT: 06/24/2020 12:36  CLINICAL DATA:  Chest pain  EXAM: Cardiac/Coronary CTA  TECHNIQUE: The patient was scanned on a [employer]. A 100 kV prospective scan was triggered in the descending thoracic aorta at 111 HU's. Axial non-contrast 3 mm slices were carried out through the heart. The data set was analyzed on a dedicated work station and scored using the Agatson method. Gantry rotation speed was 250 msecs and collimation was .6 mm. 5 mg IV metoprolol  and 0.8 mg of sl NTG was given. The 3D data set was reconstructed in 5% intervals of the 35-75 % of the R-R cycle. Diastolic phases were analyzed on a dedicated work station using MPR, MIP and VRT modes. The patient received 80 cc of contrast.  FINDINGS: Image quality: excellent.  Noise artifact is: Limited.  Coronary Arteries:  Normal coronary origin.  Right dominance.  Left main: The left main is a large caliber vessel with a normal take off from the left coronary cusp that trifurcates into a LAD, LCX, and ramus intermedius. There is no plaque or stenosis.  Left anterior descending artery: The proximal LAD contains minimal mixed density plaque (<25%). The mid and distal LAD segments are patent. The LAD gives off 1 large patent diagonal branch.  Ramus intermedius: Patent with no evidence of plaque or stenosis.  Left circumflex artery: The LCX is non-dominant and patent with no evidence of plaque or stenosis. The LCX gives off 2 patent obtuse marginal branches.  Right coronary artery: The RCA is dominant with normal take off from the right coronary cusp. The proximal RCA contains minimal mixed density plaque (<25%). The mid and distal segments are patent. The RCA terminates as a PDA and right posterolateral branch without evidence of plaque or stenosis.  Right Atrium: Right atrial size is within normal limits.  Right Ventricle: The right ventricular cavity  is within normal limits.  Left Atrium: Left atrial size is normal in size with no left atrial appendage filling defect.  Left Ventricle: The ventricular cavity size is within normal limits. There are no stigmata of prior infarction. There is no abnormal filling defect.  Pulmonary arteries: Normal in size without proximal filling defect.  Pulmonary veins: Normal pulmonary venous drainage.  Pericardium: Normal thickness with no significant effusion or calcium present.  Cardiac valves: The aortic valve is trileaflet without significant calcification. The mitral valve is normal structure without significant calcification.  Aorta: Normal caliber with no significant disease.  Extra-cardiac findings: See attached radiology report for non-cardiac structures.  IMPRESSION: 1. Coronary calcium score of 54. This was 61st percentile for age and sex matched controls.  2. Normal coronary origin with right dominance.  3. Minimal, non-obstructive CAD in the LAD/RCA (<25%).  RECOMMENDATIONS: 1. Minimal non-obstructive CAD (0-24%). Consider non-atherosclerotic causes of chest pain. Consider preventive therapy and risk factor modification.  Jackquelyn Mass, MD   Electronically Signed By: Jackquelyn Mass On: 06/24/2020 12:36  Narrative EXAM: OVER-READ INTERPRETATION  CT CHEST  The following report is an over-read performed by radiologist Dr. Alexandria Angel of [facility] on 06/24/2020. This over-read does not include interpretation of cardiac or coronary anatomy or pathology. The coronary calcium score/coronary CTA interpretation by the cardiologist is attached.  COMPARISON:  None.  FINDINGS: Areas of ground-glass attenuation and mild septal thickening in the periphery of the lateral segment of the right middle lobe. Within the visualized portions of the thorax there are no suspicious appearing pulmonary nodules or masses, no pleural effusions, no pneumothorax and  no lymphadenopathy. Visualized portions of the upper abdomen are unremarkable. There are no aggressive appearing lytic or blastic lesions noted in the visualized portions of the skeleton.  IMPRESSION: 1. Small amount of ground-glass attenuation and septal thickening in the lateral segment of the right middle lobe, which is nonspecific, but likely of infectious or inflammatory etiology.  Electronically Signed: By: Alexandria Angel M.D. On: 06/24/2020 11:18     ______________________________________________________________________________________________       Current Reported Medications:.    Current Meds  Medication Sig   albuterol  (PROAIR  HFA) 108 (90 Base) MCG/ACT inhaler ProAir  HFA 90 mcg/actuation aerosol inhaler  INHALE 1 TO 2 PUFFS EVERY 4 TO 6 HOURS AS NEEDED FOR WHEEZING   allopurinol (ZYLOPRIM) 100 MG tablet Take 100 mg by mouth daily.   amLODipine  (NORVASC ) 5 MG tablet Take 5 mg by mouth daily.   B Complex-C (B-COMPLEX WITH VITAMIN C) tablet Take 1 tablet by mouth daily.   budesonide -formoterol  (SYMBICORT ) 160-4.5 MCG/ACT inhaler Inhale 2 puffs into the lungs in the morning and at bedtime. Rinse and gargle after each use.   diphenoxylate-atropine (LOMOTIL) 2.5-0.025 MG tablet Take 1 tablet by mouth daily.   fenofibrate (TRICOR) 48 MG tablet Take 1 tablet by mouth daily.   glucosamine-chondroitin 500-400 MG tablet Take by mouth.   HYDROcodone -acetaminophen  (NORCO) 10-325 MG tablet Take 1 tablet by mouth as needed.   Iron-FA-B Cmp-C-Biot-Probiotic (FUSION PLUS) CAPS Take 1 capsule by mouth daily.   losartan  (COZAAR ) 100 MG tablet TAKE 1 TABLET(100 MG) BY MOUTH DAILY   NON FORMULARY Cod liver Oil with Vit D - 1000 mg.   NON FORMULARY Prostate Optimizer   rosuvastatin (CRESTOR) 20 MG tablet Take 10 mg by mouth daily.    Specialty Vitamins Products (PROSTATE PO) Take 1 tablet by mouth daily.   UNABLE TO FIND Take 2 tablets by mouth daily. Med Name: muira puama.     Physical Exam:    VS:  BP (!) 126/50 (BP Location: Right Arm, Patient Position: Sitting, Cuff Size: Large)   Pulse 84   Ht 5\' 7"  (1.702 m)   Wt 265 lb (120.2 kg)   SpO2 95%   BMI 41.50 kg/m    Wt Readings from Last 3 Encounters:  01/29/24 265 lb (120.2 kg)  01/16/24 265 lb 12.8 oz (120.6 kg)  12/22/23 264 lb (119.7 kg)    GEN: Well nourished, well developed in no acute distress NECK: No JVD; No carotid bruits CARDIAC: RRR, no murmurs, rubs, gallops RESPIRATORY:  Clear to auscultation without rales, wheezing or rhonchi  ABDOMEN: Soft, non-tender, non-distended EXTREMITIES:  No edema; No acute deformity     Asessement and Plan:.    Palpitations: At last office visit patient reported a few episodes waking him from sleep when he felt like his heart was fluttering episodes lasting up to 45 minutes.  He also endorsed some shortness of breath. Cardiac monitor worn for 13 days and 23 hours indicated  predominant underlying rhythm was sinus rhythm, average heart rate 83 bpm ranging from 50 to 288 bpm.  He had 3 runs of supraventricular tachycardia, run with the fastest interval lasting 5 beats with a maximum rate of 188 bpm, longest lasting 4 beats with an average rate of 138 bpm.  Rare PACs and rare PVCs.  Symptoms were associated with sinus and sinus tachycardia. Today he reports that his symptoms have improved, he notes rare short palpitations that are overall not bothersome to him.  Reviewed patient's cardiac monitor results and he was reassured.  Encouraged to continue staying well-hydrated, decrease caffeine intake.  He will continue to monitor and notify the office if worsening.  Reviewed ED precautions.  Nonobstructive CAD: Coronary CTA in September 2021 revealed a coronary calcium score of 54 with minimal nonobstructive CAD. Stable with no anginal symptoms. No indication for ischemic evaluation.  Heart healthy diet and regular cardiovascular exercise encouraged. Reviewed ED precautions.    Hypertension: Blood pressure today 126/50. Continue amlodipine  5 mg daily, losartan  100 mg daily.  Hyperlipidemia: Patient reports that this has been monitored managed per his PCP, will request labs from his PCPs office.  On rosuvastatin 10 mg daily and fenofibrate 48 mg daily.   Lower extremity edema:  Echocardiogram 10/11/2023 indicated LVEF of 60 to 65%, no RWMA, mild LVH, diastolic parameters were normal, RV systolic function and size was normal, no significant valvular abnormalities.  Patient notes bilateral lower extremity edema that progresses throughout the day and improves overnight.  Denies any increased shortness of breath, orthopnea or PND.  Reviewed echocardiogram results.  Encourage patient to decrease salt intake, elevate lower extremities and try compression stockings.  Sleep disordered breathing: Split-night sleep study currently pending.  Preoperative cardiac evaluation: Total knee arthroplasty. Mr. Natt's perioperative risk of a major cardiac event is 0.4% according to the Revised Cardiac Risk Index (RCRI).  Therefore, he is at low risk for perioperative complications.   His functional capacity is good at 5.07 METs according to the Duke Activity Status Index (DASI). Recommendations: According to ACC/AHA guidelines, no further cardiovascular testing needed.  The patient may proceed to surgery at acceptable risk.     Disposition: F/u with Dr. Audery Blazing in six months or sooner if needed.   Signed, Treina Arscott D Diesel Lina, NP

## 2024-01-29 ENCOUNTER — Encounter: Payer: Self-pay | Admitting: Cardiology

## 2024-01-29 ENCOUNTER — Ambulatory Visit: Payer: BC Managed Care – PPO | Attending: Cardiology | Admitting: Cardiology

## 2024-01-29 VITALS — BP 126/50 | HR 84 | Ht 67.0 in | Wt 265.0 lb

## 2024-01-29 DIAGNOSIS — I2510 Atherosclerotic heart disease of native coronary artery without angina pectoris: Secondary | ICD-10-CM

## 2024-01-29 DIAGNOSIS — I10 Essential (primary) hypertension: Secondary | ICD-10-CM

## 2024-01-29 DIAGNOSIS — G4730 Sleep apnea, unspecified: Secondary | ICD-10-CM

## 2024-01-29 DIAGNOSIS — Z01810 Encounter for preprocedural cardiovascular examination: Secondary | ICD-10-CM

## 2024-01-29 DIAGNOSIS — E7800 Pure hypercholesterolemia, unspecified: Secondary | ICD-10-CM | POA: Diagnosis not present

## 2024-01-29 DIAGNOSIS — R002 Palpitations: Secondary | ICD-10-CM | POA: Diagnosis not present

## 2024-01-29 NOTE — Patient Instructions (Signed)
 Medication Instructions:  No changes *If you need a refill on your cardiac medications before your next appointment, please call your pharmacy*  Lab Work: No labs If you have labs (blood work) drawn today and your tests are completely normal, you will receive your results only by: MyChart Message (if you have MyChart) OR A paper copy in the mail If you have any lab test that is abnormal or we need to change your treatment, we will call you to review the results.  Testing/Procedures: No testing  Follow-Up: At [facility], you and your health needs are our priority.  As part of our continuing mission to provide you with exceptional heart care, our providers are all part of one team.  This team includes your primary Cardiologist (physician) and Advanced Practice Providers or APPs (Physician Assistants and Nurse Practitioners) who all work together to provide you with the care you need, when you need it.  Your next appointment:   5-6 month(s)  Provider:   Alexandria Angel, MD    We recommend signing up for the patient portal called "MyChart".  Sign up information is provided on this After Visit Summary.  MyChart is used to connect with patients for Virtual Visits (Telemedicine).  Patients are able to view lab/test results, encounter notes, upcoming appointments, etc.  Non-urgent messages can be sent to your provider as well.   To learn more about what you can do with MyChart, go to [email or web address].   Other Instructions:   1st Floor: - Lobby - Registration  - Pharmacy  - Lab - Cafe  2nd Floor: - PV Lab - Diagnostic Testing (echo, CT, nuclear med)  3rd Floor: - Vacant  4th Floor: - TCTS (cardiothoracic surgery) - AFib Clinic - Structural Heart Clinic - Vascular Surgery  - Vascular Ultrasound  5th Floor: - [facility] (general and EP) - Clinical Pharmacy for coumadin, hypertension, lipid, weight-loss medications, and med management  appointments    Valet parking services will be available as well.

## 2024-01-30 ENCOUNTER — Encounter: Payer: Self-pay | Admitting: Cardiology

## 2024-02-01 ENCOUNTER — Ambulatory Visit: Admitting: Emergency Medicine

## 2024-02-01 ENCOUNTER — Encounter: Payer: Self-pay | Admitting: Emergency Medicine

## 2024-02-01 VITALS — BP 122/60 | HR 83 | Ht 67.5 in | Wt 267.4 lb

## 2024-02-01 DIAGNOSIS — M069 Rheumatoid arthritis, unspecified: Secondary | ICD-10-CM | POA: Diagnosis not present

## 2024-02-01 DIAGNOSIS — J4520 Mild intermittent asthma, uncomplicated: Secondary | ICD-10-CM | POA: Diagnosis not present

## 2024-02-01 DIAGNOSIS — J984 Other disorders of lung: Secondary | ICD-10-CM

## 2024-02-01 DIAGNOSIS — R9389 Abnormal findings on diagnostic imaging of other specified body structures: Secondary | ICD-10-CM | POA: Diagnosis not present

## 2024-02-01 NOTE — Patient Instructions (Signed)
 We reviewed your CT scan of the chest today.  This is stable compared with priors.  Good news.  There are some very subtle inflammatory change that comes and goes but has not progressed and has not caused any scarring.  I do not think we need to repeat your CT chest on any particular schedule.  We can consider doing so if you develop breathing changes. Continue to use your Symbicort  2 puffs when needed for flaring symptoms Keep your albuterol  available use 2 puffs when needed for shortness of breath, chest tightness, wheezing. We will refer you to see rheumatology for evaluation of your rheumatoid arthritis Follow Dr. Baldwin Levee in 1 year, sooner if you have any problems.

## 2024-02-01 NOTE — Assessment & Plan Note (Signed)
 Principally due to obesity.  No evidence of ILD associated with his mild subpleural groundglass.

## 2024-02-01 NOTE — Assessment & Plan Note (Signed)
 Waxing and waning subpleural groundglass change without ILD or scarring or honeycombing.  Suspect that this is rheumatoid in nature.  Stable for over 2 years.  I do not think we need to continue doing interval surveillance unless he has a clinical change.

## 2024-02-01 NOTE — Assessment & Plan Note (Signed)
 He has been on Symbicort  but is currently only using as needed.  I did encourage him to use it twice a day on a schedule but he wants to continue using the same.  May be able to come off of this altogether in the future, just use albuterol  as needed

## 2024-02-01 NOTE — Assessment & Plan Note (Signed)
 Carries a history of RA but does not see rheumatology, unclear if he has had a full workup.  If he does have rheumatoid disease and it would explain his subpleural groundglass infiltrates that waxed and waned.  I will refer him to see rheumatology for formal evaluation.

## 2024-02-01 NOTE — Progress Notes (Signed)
   Subjective:    Patient ID: Andre Turner, male    DOB: 22-Mar-1961, 63 y.o.   MRN: [id number]  HPI  ROV 02/01/2024 --follow-up visit for 63 year old man with a history of asthmatic COPD, obesity, suspected OSA.  Also with RA, hypertension, GERD, allergic rhinitis.  We have been following an abnormal CT scan of the chest with a subtle focal groundglass infiltrate in the left upper lobe in the right middle lobe of unclear cause, question rheumatoid.  Has been managed on Symbicort .  His CT scan of the chest was repeated on 01/16/24 as below He is feeling well overall. He gets SOB w some exertion, when bending over. He is only using symbicort  prn.   CT scan of the chest 01/16/24 reviewed by me, shows mild cylindrical bronchiectasis with some right middle lobe and lingular scarring and subpleural groundglass reticulation waxing and waning compared with priors going back 2 years    Review of Systems As per HPI     Objective:   Physical Exam Vitals:   02/01/24 1457  BP: 122/60  Pulse: 83  SpO2: 95%  Weight: 267 lb 6.4 oz (121.3 kg)  Height: 5' 7.5" (1.715 m)   Gen: Pleasant, obese man, in no distress,  normal affect, some intermittent cough  ENT: No lesions,  mouth clear,  oropharynx clear, no postnasal drip  Neck: No JVD, no stridor, strong voice  Lungs: No use of accessory muscles, few scattered rhonchi, right greater than left, no crackles or wheezes  Cardiovascular: RRR, heart sounds normal, no murmur or gallops  Musculoskeletal: No deformities, no cyanosis or clubbing  Neuro: alert, awake, non focal  Skin: Warm, no lesions or rash      Assessment & Plan:  Restrictive lung disease Principally due to obesity.  No evidence of ILD associated with his mild subpleural groundglass.  Mild intermittent asthma He has been on Symbicort  but is currently only using as needed.  I did encourage him to use it twice a day on a schedule but he wants to continue using the same.   May be able to come off of this altogether in the future, just use albuterol  as needed  Rheumatoid arthritis (HCC) Carries a history of RA but does not see rheumatology, unclear if he has had a full workup.  If he does have rheumatoid disease and it would explain his subpleural groundglass infiltrates that waxed and waned.  I will refer him to see rheumatology for formal evaluation.  Abnormal CT of the chest Waxing and waning subpleural groundglass change without ILD or scarring or honeycombing.  Suspect that this is rheumatoid in nature.  Stable for over 2 years.  I do not think we need to continue doing interval surveillance unless he has a clinical change.      Racheal Buddle, MD, PhD 02/01/2024, 3:16 PM [facility] [phone] or if no answer [phone]

## 2024-02-05 ENCOUNTER — Telehealth: Payer: Self-pay

## 2024-02-05 NOTE — Telephone Encounter (Addendum)
 Called patient as per Dr. Maryalice Smaller to relay results below, patient voiced full understanding and had not further questions at this time.   ----- Message from Andre Turner sent at 01/26/2024 11:22 AM EDT ----- Please let pt know his flow cytometry was negative for CLL, his elevated lymphocytes is likely reactive and benign, will continue monitoring.  Andre [place]

## 2024-02-21 HISTORY — PX: REPLACEMENT TOTAL KNEE: SUR1224

## 2024-02-26 ENCOUNTER — Telehealth: Payer: Self-pay

## 2024-02-26 NOTE — Telephone Encounter (Signed)
**Note De-Identified [employer] Obfuscation** Per the [employer] Provider Portal: This request does not meet review criteria for an In-Lab Sleep Study (Split Night Sleep Study). You have the following options: Switch to HST - Type III (Your request will be complete.) or Switch to Devices using Peripheral Arterial Tone (PAT)-(Your request will be complete.)

## 2024-03-15 NOTE — Progress Notes (Deleted)
 Office Visit Note  Patient: Andre Turner             Date of Birth: 06-26-1961           MRN: [id number]             PCP: Lindy Rhyme, NP Referring: Denson Flake, MD Visit Date: 03/29/2024 Occupation: @GUAROCC @  Subjective:    History of Present Illness: Andre Turner is a 63 y.o. male who presents today for a new patient consultation.      Activities of Daily Living:  Patient reports morning stiffness for *** {minute/hour:19697}.   Patient {ACTIONS;DENIES/REPORTS:21021675::"Denies"} nocturnal pain.  Difficulty dressing/grooming: {ACTIONS;DENIES/REPORTS:21021675::"Denies"} Difficulty climbing stairs: {ACTIONS;DENIES/REPORTS:21021675::"Denies"} Difficulty getting out of chair: {ACTIONS;DENIES/REPORTS:21021675::"Denies"} Difficulty using hands for taps, buttons, cutlery, and/or writing: {ACTIONS;DENIES/REPORTS:21021675::"Denies"}  No Rheumatology ROS completed.   PMFS History:  Patient Active Problem List   Diagnosis Date Noted  . Rheumatoid arthritis (HCC) 02/01/2024  . Preoperative respiratory examination 12/22/2023  . Palpitations 12/22/2023  . Snoring 12/22/2023  . Osteoarthritis (arthritis due to wear and tear of joints) 10/19/2022  . Lymphocytosis 06/10/2022  . Iron deficiency anemia 06/10/2022  . Abnormal CT of the chest 04/08/2021  . Restrictive lung disease 08/17/2020  . Shortness of breath 05/21/2020  . Hypertension 07/13/2011  . Hyperlipidemia 07/13/2011  . Skin lesions, generalized 06/05/2011  . Erectile dysfunction 06/05/2011  . Allergic rhinitis 06/25/2007  . Mild intermittent asthma 06/25/2007  . Headache 06/25/2007  . BENIGN PROSTATIC HYPERTROPHY, HX OF 06/25/2007    Past Medical History:  Diagnosis Date  . Allergic rhinitis   . Anemia   . Arthritis    rhuematoid and osteo arthritis  . Cataract   . Chronic headache    migraines  . Diverticulitis   . GERD (gastroesophageal reflux disease)   . History of benign  prostatic hypertrophy   . Hyperlipidemia   . Hypertension   . Restless leg   . Tricuspid regurgitation   . Unspecified asthma(493.90)     Family History  Problem Relation Age of Onset  . Colon cancer Mother   . Lung cancer Mother   . Obesity Mother   . Prostate cancer Father   . Hypertension Father   . Heart failure Father   . Leukemia Father   . Colon cancer Maternal Grandmother   . Diabetes Other        FH- maternal side  . Prostate cancer Other        FH-maternal side  . Esophageal cancer Neg Hx   . Rectal cancer Neg Hx   . Stomach cancer Neg Hx    Past Surgical History:  Procedure Laterality Date  . COLONOSCOPY    . INGUINAL HERNIA REPAIR    . KNEE ARTHROSCOPY Right 11/18/2022  . PATELLA-FEMORAL ARTHROPLASTY Left 07/14/2016   Procedure: LEFT KNEE PATELLA-FEMORAL  REPLACEMENT ARTHROPLASTY;  Surgeon: Ferd Householder, MD;  Location: [facility];  Service: Orthopedics;  Laterality: Left;  . REPLACEMENT TOTAL KNEE Left 01/24/2022  . TENDON REPAIR    . TONSILLECTOMY     Social History   Social History Narrative   HS[employer], working on college degree      single. Was employed at [employer] until December.         Immunization History  Administered Date(s) Administered  . Tdap 02/19/2007     Objective: Vital Signs: There were no vitals taken for this visit.   Physical Exam Vitals and nursing note reviewed.  Constitutional:  Appearance: He is well-developed.  HENT:     Head: Normocephalic and atraumatic.  Eyes:     Conjunctiva/sclera: Conjunctivae normal.     Pupils: Pupils are equal, round, and reactive to light.  Cardiovascular:     Rate and Rhythm: Normal rate and regular rhythm.     Heart sounds: Normal heart sounds.  Pulmonary:     Effort: Pulmonary effort is normal.     Breath sounds: Normal breath sounds.  Abdominal:     General: Bowel sounds are normal.     Palpations: Abdomen is soft.  Musculoskeletal:     Cervical back: Normal range of  motion and neck supple.  Skin:    General: Skin is warm and dry.     Capillary Refill: Capillary refill takes less than 2 seconds.  Neurological:     Mental Status: He is alert and oriented to person, place, and time.  Psychiatric:        Behavior: Behavior normal.     Musculoskeletal Exam: ***  CDAI Exam: CDAI Score: -- Patient Global: --; Provider Global: -- Swollen: --; Tender: -- Joint Exam 03/29/2024   No joint exam has been documented for this visit   There is currently no information documented on the homunculus. Go to the Rheumatology activity and complete the homunculus joint exam.  Investigation: No additional findings.  Imaging: No results found.  Recent Labs: Lab Results  Component Value Date   WBC 11.7 (H) 01/16/2024   HGB 14.2 01/16/2024   PLT 250 01/16/2024   NA 134 (L) 01/16/2024   K 4.0 01/16/2024   CL 103 01/16/2024   CO2 25 01/16/2024   GLUCOSE 105 (H) 01/16/2024   BUN 28 (H) 01/16/2024   CREATININE 1.32 (H) 01/16/2024   BILITOT 0.7 01/16/2024   ALKPHOS 75 01/16/2024   AST 26 01/16/2024   ALT 26 01/16/2024   PROT 7.5 01/16/2024   ALBUMIN 4.6 01/16/2024   CALCIUM 9.3 01/16/2024   GFRAA >60 07/13/2020    Speciality Comments: No specialty comments available.  Procedures:  No procedures performed Allergies: Lisinopril-hydrochlorothiazide, Meloxicam, Metoprolol , Celebrex [celecoxib], Duloxetine, Percocet [oxycodone -acetaminophen ], Sulfa antibiotics, Sulfonamide derivatives, Tetracycline, and Latex   Assessment / Plan:     Visit Diagnoses: Rheumatoid arthritis involving multiple sites, unspecified whether rheumatoid factor present (HCC)  Primary osteoarthritis involving multiple joints  Restrictive lung disease  Mild intermittent asthma without complication  Primary hypertension  Moderate mixed hyperlipidemia not requiring statin therapy  Iron deficiency anemia due to chronic blood loss  Lymphocytosis  Palpitations  Orders: No  orders of the defined types were placed in this encounter.  No orders of the defined types were placed in this encounter.   Face-to-face time spent with patient was *** minutes. Greater than 50% of time was spent in counseling and coordination of care.  Follow-Up Instructions: No follow-ups on file.   Romayne Clubs, PA-C  Note - This record has been created using Dragon software.  Chart creation errors have been sought, but may not always  have been located. Such creation errors do not reflect on  the standard of medical care.

## 2024-03-29 ENCOUNTER — Encounter: Admitting: Physician Assistant

## 2024-03-29 DIAGNOSIS — R002 Palpitations: Secondary | ICD-10-CM

## 2024-03-29 DIAGNOSIS — J4520 Mild intermittent asthma, uncomplicated: Secondary | ICD-10-CM

## 2024-03-29 DIAGNOSIS — M069 Rheumatoid arthritis, unspecified: Secondary | ICD-10-CM

## 2024-03-29 DIAGNOSIS — D500 Iron deficiency anemia secondary to blood loss (chronic): Secondary | ICD-10-CM

## 2024-03-29 DIAGNOSIS — J984 Other disorders of lung: Secondary | ICD-10-CM

## 2024-03-29 DIAGNOSIS — E782 Mixed hyperlipidemia: Secondary | ICD-10-CM

## 2024-03-29 DIAGNOSIS — I10 Essential (primary) hypertension: Secondary | ICD-10-CM

## 2024-03-29 DIAGNOSIS — M150 Primary generalized (osteo)arthritis: Secondary | ICD-10-CM

## 2024-03-29 DIAGNOSIS — D72820 Lymphocytosis (symptomatic): Secondary | ICD-10-CM

## 2024-03-29 NOTE — Progress Notes (Signed)
 Office Visit Note  Patient: Andre Turner             Date of Birth: 15-Apr-1961           MRN: [id number]             PCP: Anice Wilbert BIRCH, NP Referring: Shelah Lamar RAMAN, MD Visit Date: 04/11/2024 Occupation: @GUAROCC @  Subjective:  Pain in multiple joints  History of Present Illness: Andre Turner is a 63 y.o. male who presents today for a new patient consultation.  Patient reports that he was previously diagnosed with arthritis while in his 30s by Dr. Murrell.  Patient is unsure of what type of arthritis he was diagnosed with but denies use of any immunosuppressive agents.  He was tried on meloxicam and Celebrex which she discontinued due to an allergic reaction.  Patient states that his symptoms had initially started in both hands then he started to have increased discomfort in both shoulders and both ankle joints.  He has not taking any over-the-counter products for symptomatic relief.  Patient works a physically demanding job at a [employer].  Patient states that overall his mobility has improved since having both knees replaced.  He remains in physical therapy once a week since his right knee was replaced in May 2025. Patient is under the care of of Dr. Shelah for restrictive lung disease. He has been experiencing morning stiffness involving both hands lasting for about 1 hour daily.  He has had intermittent swelling especially in the right wrist.  He experiences occasional nocturnal pain.  He takes hydrocodone  sparingly for symptomatic relief.  Patient states that in 2023 he was diagnosed with gout.  He has been taking allopurinol 200 mg daily. The patient is currently being treated for pneumonia with a Z-Pak and prednisone .  He took prednisone  30 mg this morning and will be taking another 10 mg tablet this evening.  He plans on continuing the prednisone  taper as prescribed.  Patient states that he will be having a repeat chest x-ray to make sure the  infection has cleared once he has completed a course of antibiotics and prednisone . Patient states that his mother and maternal grandmother both had arthritis but he is unsure of what type.  He denies any other known rheumatologic family history.  He denies any personal or family history of psoriasis, Crohn's disease, or ulcerative colitis.  Activities of Daily Living:  Patient reports morning stiffness for 1 hour.   Patient Reports nocturnal pain.  Difficulty dressing/grooming: Denies Difficulty climbing stairs: Denies Difficulty getting out of chair: Denies Difficulty using hands for taps, buttons, cutlery, and/or writing: Reports  Review of Systems  Constitutional:  Positive for fatigue.  HENT:  Positive for mouth dryness. Negative for mouth sores.   Eyes:  Positive for dryness.  Respiratory:  Positive for shortness of breath.   Cardiovascular:  Negative for chest pain and palpitations.  Gastrointestinal:  Positive for constipation and diarrhea. Negative for blood in stool.  Endocrine: Positive for increased urination.  Genitourinary:  Positive for involuntary urination.  Musculoskeletal:  Positive for joint pain, joint pain, joint swelling, myalgias, morning stiffness, muscle tenderness and myalgias. Negative for gait problem and muscle weakness.  Skin:  Positive for color change and rash. Negative for hair loss and sensitivity to sunlight.  Allergic/Immunologic: Negative for susceptible to infections.  Neurological:  Negative for dizziness and headaches.  Hematological:  Negative for swollen glands.  Psychiatric/Behavioral:  Positive for sleep disturbance. Negative  for depressed mood. The patient is not nervous/anxious.     PMFS History:  Patient Active Problem List   Diagnosis Date Noted   Rheumatoid arthritis (HCC) 02/01/2024   Preoperative respiratory examination 12/22/2023   Palpitations 12/22/2023   Snoring 12/22/2023   Osteoarthritis (arthritis due to wear and tear of  joints) 10/19/2022   Lymphocytosis 06/10/2022   Iron deficiency anemia 06/10/2022   Abnormal CT of the chest 04/08/2021   Restrictive lung disease 08/17/2020   Shortness of breath 05/21/2020   Hypertension 07/13/2011   Hyperlipidemia 07/13/2011   Skin lesions, generalized 06/05/2011   Erectile dysfunction 06/05/2011   Allergic rhinitis 06/25/2007   Mild intermittent asthma 06/25/2007   Headache 06/25/2007   BENIGN PROSTATIC HYPERTROPHY, HX OF 06/25/2007    Past Medical History:  Diagnosis Date   Allergic rhinitis    Anemia    Arthritis    rhuematoid and osteo arthritis   Cataract    Chronic headache    migraines   Diverticulitis    GERD (gastroesophageal reflux disease)    History of benign prostatic hypertrophy    Hyperlipidemia    Hypertension    Restless leg    Tricuspid regurgitation    Unspecified asthma(493.90)     Family History  Problem Relation Age of Onset   Colon cancer Mother    Lung cancer Mother    Obesity Mother    Prostate cancer Father    Hypertension Father    Heart failure Father    Leukemia Father    Healthy Sister    Colon cancer Maternal Grandmother    Diabetes Other        FH- maternal side   Prostate cancer Other        FH-maternal side   Esophageal cancer Neg Hx    Rectal cancer Neg Hx    Stomach cancer Neg Hx    Past Surgical History:  Procedure Laterality Date   COLONOSCOPY     INGUINAL HERNIA REPAIR     KNEE ARTHROSCOPY Right 11/18/2022   PATELLA-FEMORAL ARTHROPLASTY Left 07/14/2016   Procedure: LEFT KNEE PATELLA-FEMORAL  REPLACEMENT ARTHROPLASTY;  Surgeon: Toribio JULIANNA Chancy, MD;  Location: [facility];  Service: Orthopedics;  Laterality: Left;   REPLACEMENT TOTAL KNEE Left 01/24/2022   REPLACEMENT TOTAL KNEE Right 02/21/2024   TENDON REPAIR     TONSILLECTOMY     Social History   Social History Narrative   [employer], working on college degree      single. Was employed at [employer] until December.         Immunization  History  Administered Date(s) Administered   Tdap 02/19/2007     Objective: Vital Signs: BP 139/69 (BP Location: Right Arm, Patient Position: Sitting, Cuff Size: Normal)   Pulse 78   Resp 15   Ht 5' 7.5 (1.715 m)   Wt 252 lb (114.3 kg)   BMI 38.89 kg/m    Physical Exam Vitals and nursing note reviewed.  Constitutional:      Appearance: He is well-developed.  HENT:     Head: Normocephalic and atraumatic.  Eyes:     Conjunctiva/sclera: Conjunctivae normal.     Pupils: Pupils are equal, round, and reactive to light.  Cardiovascular:     Rate and Rhythm: Normal rate and regular rhythm.     Heart sounds: Normal heart sounds.  Pulmonary:     Effort: Pulmonary effort is normal.     Breath sounds: Wheezing and rhonchi (Scattered) present.  Abdominal:  General: Bowel sounds are normal.     Palpations: Abdomen is soft.  Musculoskeletal:     Cervical back: Normal range of motion and neck supple.     Right lower leg: Edema present.     Left lower leg: Edema present.  Skin:    General: Skin is warm and dry.     Capillary Refill: Capillary refill takes less than 2 seconds.     Comments: No fingernail pitting noted.  No plaques of psoriasis noted.  Neurological:     Mental Status: He is alert and oriented to person, place, and time.  Psychiatric:        Behavior: Behavior normal.      Musculoskeletal Exam: C-spine has limited ROM with lateral rotation.  Midline spinal tenderness in the lumbar region.  Shoulder joints have painful ROM.  No tenderness or swelling along the elbow joint line. Tenderness of both wrists, right worse than left.  No tenderness or synovitis of MCPs joints.  PIP and DIP thickening consistent with osteoarthritis of both hands. Slightly limited ROM of both hips.  Slightly limited flexion and extension of the right knee replacement-warmth of the right knee.  Pedal edema noted in bilateral LE, right > left.  Left knee replacement has good ROM with no warmth or  effusion.  Tenderness of both ankles.   CDAI Exam: CDAI Score: -- Patient Global: --; Provider Global: -- Swollen: 0 ; Tender: 6  Joint Exam 04/11/2024      Right  Left  Glenohumeral   Tender   Tender  Wrist   Tender   Tender  Ankle   Tender   Tender     Investigation: No additional findings.  Imaging: No results found.  Recent Labs: Lab Results  Component Value Date   WBC 11.7 (H) 01/16/2024   HGB 14.2 01/16/2024   PLT 250 01/16/2024   NA 134 (L) 01/16/2024   K 4.0 01/16/2024   CL 103 01/16/2024   CO2 25 01/16/2024   GLUCOSE 105 (H) 01/16/2024   BUN 28 (H) 01/16/2024   CREATININE 1.32 (H) 01/16/2024   BILITOT 0.7 01/16/2024   ALKPHOS 75 01/16/2024   AST 26 01/16/2024   ALT 26 01/16/2024   PROT 7.5 01/16/2024   ALBUMIN 4.6 01/16/2024   CALCIUM 9.3 01/16/2024   GFRAA >60 07/13/2020    Speciality Comments: No specialty comments available.  Procedures:  No procedures performed Allergies: Lisinopril-hydrochlorothiazide, Meloxicam, Metoprolol , Celebrex [celecoxib], Duloxetine, Percocet [oxycodone -acetaminophen ], Sulfa antibiotics, Sulfonamide derivatives, Tetracycline, and Latex   Assessment / Plan:     Visit Diagnoses: Rheumatoid arthritis involving multiple sites, unspecified whether rheumatoid factor present [facility]): Diagnosis unclear.  According to the patient he was evaluated by Dr. Murrell about 30 years ago and was diagnosed with arthritis.  He is unsure of the type of arthritis he was previously diagnosed with.  He has never seen rheumatology in the past. He has not taken any immunosuppressive agents. Unable to take NSAIDs due to allergies. Patient reports family history of arthritis in both his mother and maternal grandmother but is unsure of the type of arthritis. Of note the patient is under the care of Dr. Shelah.  He has been diagnosed with restrictive lung disease and mild asthma.  Recent chest CT results revealed mild subpleural groundglass but no evidence of  ILD. Plan to obtain the following lab work and x-rays today for further evaluation.  Discussed that he may require an ultrasound to assess for synovitis once he is off  of prednisone  for a length of time.  Chronic pain of both shoulders -He has been experiencing increased pain and stiffness in both shoulder joints.  On examination today he has discomfort with range of motion as well as some tenderness upon palpation.  X-rays of both shoulders were obtained today for further evaluation.  Plan: XR Shoulder Left, XR Shoulder Right  Pain in both hands - He has had a longstanding history of pain involving both hands.  He was previously evaluated by Dr. Murrell about 30 years ago at which time he was diagnosed with arthritis.  He is unsure of the type of arthritis but was not treated with any immunosuppressive agents.  He tried meloxicam and Celebrex but had to discontinue due to an allergic reaction.  He has not been taking any over-the-counter products.  He has intermittent swelling in both wrist joints especially the right wrist.  He works a physically demanding job at a [employer].  On examination he has tenderness of both wrist joints, right more severe than left.  PIP and DIP thickening consistent with osteoarthritis was noted.  Plan to update x-rays along with the following lab work today for further evaluation.  Of note the patient took 30 mg of prednisone  this morning.  No obvious synovitis was noted.  Discussed that he may require an ultrasound in the future once he has been off of prednisone  for a length of time to further assess for synovitis.  Plan: Cyclic citrul peptide antibody, IgG, Rheumatoid factor, Sedimentation rate, C-reactive protein, Angiotensin converting enzyme, ANA, Mutated Citrullinated Vimentin (MCV) Antibody, XR Hand 2 View Right, XR Hand 2 View Left  Pain in both feet -He has been experiencing chronic pain and stiffness in both ankle joints.  He has occasional pain in his toes.   On examination he has significant pedal edema involving bilateral lower extremities right more severe than left.  He has tenderness over both ankle joints.  X-rays of both feet were obtained today for further evaluation.  Plan: XR Foot 2 Views Right, XR Foot 2 Views Left  History of gout - Previously diagnosed with gout in 2023.  Patient is currently taking allopurinol 200 mg daily.  Acording to the patient he had a gout flare about 3 months ago at which time he increased the dose of allopurinol to 300 mg for several days.  He has not been taking any colchicine.  Plan to update uric acid level today.  May need to make dose adjustments pending labs.  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 Lennon-Currently going to PT once weekly.  He continues to have some limitation with full flexion extension of the right knee replacement.  Warmth was noted on examination today.  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 Lennon-doing well.  No warmth or effusion noted.  Primary osteoarthritis involving multiple joints: Patient presents today with chronic pain and stiffness involving multiple joints.  His pain has been most severe involving the shoulders, both wrist, both hands, and both ankle joints.  He has noticed intermittent inflammation in his wrist joints and ankle joints.  No obvious synovitis was noted on examination today.  Of note the patient is currently taking prednisone  30 mg daily since he was recently diagnosed with pneumonia.  Plan to update x-rays of both shoulders, both hands, both feet today for further evaluation.  The following lab work will also be obtained.  Restrictive lung disease - Under care of Dr. Shelah.  No evidence of ILD associated with mild subpleural groundglass. CT of the chest ordered on 01/16/2024: Minimal subpleural groundglass and reticulation in the right middle lobe and both lower lobes, somewhat waxing and waning appearance from prior exams.   Findings are nonspecific. Reviewed Dr. Lanny office visit note from 02/01/2024--restrictive lung disease felt to be due to obesity.  No evidence of ILD.  Patient is currently prescribed Symbicort  for asthma. Plan to assess for rheumatoid arthritis as discussed above.  Other medical conditions are listed as follows:   Primary hypertension - BP was 139/69 today in the office.  Mild intermittent asthma without complication: Under the care of Dr. Shelah.  On Symbicort .  Moderate mixed hyperlipidemia not requiring statin therapy  Iron deficiency anemia due to chronic blood loss  Lymphocytosis  Palpitations  History of type 2 diabetes mellitus  Other fatigue -Plan to check CK today.  Plan: CK  Orders: Orders Placed This Encounter  Procedures   XR Hand 2 View Right   XR Hand 2 View Left   XR Foot 2 Views Right   XR Foot 2 Views Left   XR Shoulder Left   XR Shoulder Right   Cyclic citrul peptide antibody, IgG   Rheumatoid factor   Sedimentation rate   C-reactive protein   Angiotensin converting enzyme   ANA   Mutated Citrullinated Vimentin (MCV) Antibody   Uric acid   CK   No orders of the defined types were placed in this encounter.    Follow-Up Instructions: Return for NPFU.   Waddell CHRISTELLA Craze, PA-C  Note - This record has been created using Dragon software.  Chart creation errors have been sought, but may not always  have been located. Such creation errors do not reflect on  the standard of medical care.

## 2024-04-04 NOTE — Progress Notes (Signed)
 [facility] MSK ORT SM LINDS  Thu 04/04/2024  POST-OP NOTE   Patient ID:  Andre Turner. is a 63 y.o. male  Procedure:  Right total knee arthroplasty on 02/21/2024    Assessment/Plan: Right knee with some moderate stiffness and swelling status post total knee arthroplasty.  Needs to be more aggressive with his compression hose and elevation and especially aggressive with his extension.  Continue therapy.  Follow-up 7 to 8 weeks.  Call for problems.      Subjective: Right knee is doing okay.  Has some moderate discomfort as expected.  No signs of infection or new injuries.    Objective:     There were no vitals taken for this visit.   Physical Exam: Today gets 6 to 100 degrees of flexion.  2+ pitting edema in the lower leg.  No signs of infection.  Mild weakness.  Calves are soft and nontender with negative Homan.  Radiology:  XR Knees Anteroposterior Standing Bilateral Result Date: 03/09/2024 Standing AP bilateral knees with lateral and sunrise right knee taken today in office and reviewed by myself shows good position of his total knee arthroplasty.  XR Knee 1-2 Views Right Result Date: 03/09/2024 Standing AP bilateral knees with lateral and sunrise right knee taken today in office and reviewed by myself shows good position of his total knee arthroplasty.  US  Peripheral Venous Leg Unilat Right Result Date: 03/07/2024 Indications Aftercare following right knee joint replacement surgery [Z96.651] Clinical Indications  Aftercare following right knee joint replacement surgery [Z96.651]hld; htn;  Operative History  02-21-2024 Right TKA , 01-26-2022 Left total knee arthroplasty Study Technique  Venous duplex ultrasound examination using B-mode, color Doppler and spectral Doppler  Findings Main                           Compressibility  Spontaneity      Phasicity  Augmentation R Deep Veins                                                                          Common Femoral Vein              Normal  Spontaneous  Respirophasic        Normal    Righ PFV                        Normal  Spontaneous  Respirophasic        Normal    Right FV                        Normal  Spontaneous  Respirophasic        Normal    Right POP                       Normal  Spontaneous  Respirophasic        Normal    Right PTV                       Normal  Normal    Peroneal Veins                  Normal                                    Normal    Gastrocnemius Veins             Normal                                    Normal R GSV                                                                                 Right SFJ                       Normal  Spontaneous  Respirophasic        Normal L Deep Veins                                                                          Left CFV                        Normal  Spontaneous  Respirophasic        Normal Conclusion Right  No evidence of deep vein thrombosis or venous obstruction in the lower extremity. Contralateral common femoral vein is patent and compressible. Unable to visualize the small saphenous vein of the calf due to marked edema. Signed by Franky Chihuahua, MD RPVI on 2024-03-07 [id number]   Labs: Lab Results  Component Value Date   HGB 10.8 (L) 02/22/2024    Problem List:  Problem List[1]      Vinie KYM Fonder, MD            Note - This record has been created using Dragon software. Chart creation errors have been sought, but may not always have been located. Such creation errors do not reflect on the standard of medical care.          [1] Patient Active Problem List Diagnosis  . Degenerative lumbar spinal stenosis  . DDD (degenerative disc disease), lumbar  . Acute medial meniscus tear of right knee  . Lumbar spondylosis  . Epidural lipomatosis  . Major depressive disorder, recurrent episode, unspecified (CMS/HCC)  . Primary osteoarthritis of right knee  . Skin lesions, generalized  .  Palpitations  . Rheumatoid arthritis    (CMD)  . Osteoarthritis (arthritis due to wear and tear of joints)  . Mild intermittent asthma (CMD)  . Iron deficiency anemia  . Lymphocytosis  . Hypertension  . Hyperlipidemia  . Erectile dysfunction  . BPH (benign prostatic hyperplasia)  . Knee osteoarthritis  . 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

## 2024-04-11 ENCOUNTER — Ambulatory Visit

## 2024-04-11 ENCOUNTER — Ambulatory Visit: Payer: Self-pay | Admitting: Physician Assistant

## 2024-04-11 ENCOUNTER — Encounter: Payer: Self-pay | Admitting: Physician Assistant

## 2024-04-11 ENCOUNTER — Ambulatory Visit: Payer: Self-pay | Attending: Physician Assistant | Admitting: Physician Assistant

## 2024-04-11 VITALS — BP 126/73 | HR 77 | Resp 15 | Ht 67.5 in | Wt 252.0 lb

## 2024-04-11 DIAGNOSIS — M79671 Pain in right foot: Secondary | ICD-10-CM

## 2024-04-11 DIAGNOSIS — D500 Iron deficiency anemia secondary to blood loss (chronic): Secondary | ICD-10-CM | POA: Insufficient documentation

## 2024-04-11 DIAGNOSIS — J984 Other disorders of lung: Secondary | ICD-10-CM | POA: Insufficient documentation

## 2024-04-11 DIAGNOSIS — M79642 Pain in left hand: Secondary | ICD-10-CM | POA: Insufficient documentation

## 2024-04-11 DIAGNOSIS — J4520 Mild intermittent asthma, uncomplicated: Secondary | ICD-10-CM | POA: Diagnosis present

## 2024-04-11 DIAGNOSIS — M150 Primary generalized (osteo)arthritis: Secondary | ICD-10-CM | POA: Diagnosis present

## 2024-04-11 DIAGNOSIS — Z96652 Presence of left artificial knee joint: Secondary | ICD-10-CM | POA: Insufficient documentation

## 2024-04-11 DIAGNOSIS — G8929 Other chronic pain: Secondary | ICD-10-CM | POA: Insufficient documentation

## 2024-04-11 DIAGNOSIS — M79641 Pain in right hand: Secondary | ICD-10-CM | POA: Diagnosis present

## 2024-04-11 DIAGNOSIS — M79672 Pain in left foot: Secondary | ICD-10-CM | POA: Diagnosis present

## 2024-04-11 DIAGNOSIS — E782 Mixed hyperlipidemia: Secondary | ICD-10-CM | POA: Diagnosis present

## 2024-04-11 DIAGNOSIS — R002 Palpitations: Secondary | ICD-10-CM | POA: Diagnosis present

## 2024-04-11 DIAGNOSIS — Z8739 Personal history of other diseases of the musculoskeletal system and connective tissue: Secondary | ICD-10-CM | POA: Insufficient documentation

## 2024-04-11 DIAGNOSIS — M069 Rheumatoid arthritis, unspecified: Secondary | ICD-10-CM | POA: Insufficient documentation

## 2024-04-11 DIAGNOSIS — M25512 Pain in left shoulder: Secondary | ICD-10-CM

## 2024-04-11 DIAGNOSIS — R5383 Other fatigue: Secondary | ICD-10-CM | POA: Insufficient documentation

## 2024-04-11 DIAGNOSIS — D72820 Lymphocytosis (symptomatic): Secondary | ICD-10-CM | POA: Diagnosis present

## 2024-04-11 DIAGNOSIS — I10 Essential (primary) hypertension: Secondary | ICD-10-CM | POA: Diagnosis present

## 2024-04-11 DIAGNOSIS — Z8639 Personal history of other endocrine, nutritional and metabolic disease: Secondary | ICD-10-CM | POA: Diagnosis present

## 2024-04-11 DIAGNOSIS — M25511 Pain in right shoulder: Secondary | ICD-10-CM | POA: Insufficient documentation

## 2024-04-11 DIAGNOSIS — Z96651 Presence of right artificial knee joint: Secondary | ICD-10-CM | POA: Diagnosis not present

## 2024-04-11 NOTE — Progress Notes (Signed)
 X-rays of both shoulders unremarkable.  X-rays of both hands and both feet are consistent with osteoarthritis.  We will review XR in detail at [facility]

## 2024-04-14 LAB — C-REACTIVE PROTEIN: CRP: 5 mg/L (ref <8.0)

## 2024-04-14 LAB — CK: Total CK: 117 U/L (ref 22–308)

## 2024-04-14 LAB — ANTI-NUCLEAR AB-TITER (ANA TITER): ANA Titer 1: 1:40 {titer} — ABNORMAL HIGH

## 2024-04-14 LAB — RHEUMATOID FACTOR: Rheumatoid fact SerPl-aCnc: <10 [IU]/mL (ref <14)

## 2024-04-14 LAB — URIC ACID: Uric Acid, Serum: 4.4 mg/dL (ref 4.0–8.0)

## 2024-04-14 LAB — ANA: Anti Nuclear Antibody (ANA): POSITIVE — AB

## 2024-04-14 LAB — SEDIMENTATION RATE: Sed Rate: 14 mm/h (ref 0–20)

## 2024-04-14 LAB — MUTATED CITRULLINATED VIMENTIN (MCV) ANTIBODY: MUTATED CITRULLINATED VIMENTIN (MCV) AB: <20 U/mL (ref <20)

## 2024-04-14 LAB — ANGIOTENSIN CONVERTING ENZYME: Angiotensin-Converting Enzyme: 17 U/L (ref 9–67)

## 2024-04-14 LAB — CYCLIC CITRUL PEPTIDE ANTIBODY, IGG: Cyclic Citrullin Peptide Ab: <16 U

## 2024-04-14 NOTE — Progress Notes (Signed)
 Plan to discuss results at [facility]

## 2024-04-15 NOTE — Progress Notes (Deleted)
 Office Visit Note  Patient: Andre Turner             Date of Birth: August 22, 1961           MRN: [id number]             PCP: Anice Wilbert BIRCH, NP Referring: Anice Wilbert BIRCH, NP Visit Date: 04/29/2024 Occupation: @GUAROCC @  Subjective:    History of Present Illness: Ryan Ogborn is a 63 y.o. male with history of    XR OA  Ultrasound?   Prednisone ?   Activities of Daily Living:  Patient reports morning stiffness for *** {minute/hour:19697}.   Patient {ACTIONS;DENIES/REPORTS:21021675::Denies} nocturnal pain.  Difficulty dressing/grooming: {ACTIONS;DENIES/REPORTS:21021675::Denies} Difficulty climbing stairs: {ACTIONS;DENIES/REPORTS:21021675::Denies} Difficulty getting out of chair: {ACTIONS;DENIES/REPORTS:21021675::Denies} Difficulty using hands for taps, buttons, cutlery, and/or writing: {ACTIONS;DENIES/REPORTS:21021675::Denies}  No Rheumatology ROS completed.   PMFS History:  Patient Active Problem List   Diagnosis Date Noted   Rheumatoid arthritis (HCC) 02/01/2024   Preoperative respiratory examination 12/22/2023   Palpitations 12/22/2023   Snoring 12/22/2023   Osteoarthritis (arthritis due to wear and tear of joints) 10/19/2022   Lymphocytosis 06/10/2022   Iron deficiency anemia 06/10/2022   Abnormal CT of the chest 04/08/2021   Restrictive lung disease 08/17/2020   Shortness of breath 05/21/2020   Hypertension 07/13/2011   Hyperlipidemia 07/13/2011   Skin lesions, generalized 06/05/2011   Erectile dysfunction 06/05/2011   Allergic rhinitis 06/25/2007   Mild intermittent asthma 06/25/2007   Headache 06/25/2007   BENIGN PROSTATIC HYPERTROPHY, HX OF 06/25/2007    Past Medical History:  Diagnosis Date   Allergic rhinitis    Anemia    Arthritis    rhuematoid and osteo arthritis   Cataract    Chronic headache    migraines   Diverticulitis    GERD (gastroesophageal reflux disease)    History of benign prostatic hypertrophy     Hyperlipidemia    Hypertension    Restless leg    Tricuspid regurgitation    Unspecified asthma(493.90)     Family History  Problem Relation Age of Onset   Colon cancer Mother    Lung cancer Mother    Obesity Mother    Prostate cancer Father    Hypertension Father    Heart failure Father    Leukemia Father    Healthy Sister    Colon cancer Maternal Grandmother    Diabetes Other        FH- maternal side   Prostate cancer Other        FH-maternal side   Esophageal cancer Neg Hx    Rectal cancer Neg Hx    Stomach cancer Neg Hx    Past Surgical History:  Procedure Laterality Date   COLONOSCOPY     INGUINAL HERNIA REPAIR     KNEE ARTHROSCOPY Right 11/18/2022   PATELLA-FEMORAL ARTHROPLASTY Left 07/14/2016   Procedure: LEFT KNEE PATELLA-FEMORAL  REPLACEMENT ARTHROPLASTY;  Surgeon: Toribio JULIANNA Chancy, MD;  Location: [facility];  Service: Orthopedics;  Laterality: Left;   REPLACEMENT TOTAL KNEE Left 01/24/2022   REPLACEMENT TOTAL KNEE Right 02/21/2024   TENDON REPAIR     TONSILLECTOMY     Social History   Social History Narrative   HSG, working on college degree      single. Was employed at [employer] until December.         Immunization History  Administered Date(s) Administered   Tdap 02/19/2007     Objective: Vital Signs: There were no vitals taken for this  visit.   Physical Exam Vitals and nursing note reviewed.  Constitutional:      Appearance: He is well-developed.  HENT:     Head: Normocephalic and atraumatic.  Eyes:     Conjunctiva/sclera: Conjunctivae normal.     Pupils: Pupils are equal, round, and reactive to light.  Cardiovascular:     Rate and Rhythm: Normal rate and regular rhythm.     Heart sounds: Normal heart sounds.  Pulmonary:     Effort: Pulmonary effort is normal.     Breath sounds: Normal breath sounds.  Abdominal:     General: Bowel sounds are normal.     Palpations: Abdomen is soft.  Musculoskeletal:     Cervical back:  Normal range of motion and neck supple.  Skin:    General: Skin is warm and dry.     Capillary Refill: Capillary refill takes less than 2 seconds.  Neurological:     Mental Status: He is alert and oriented to person, place, and time.  Psychiatric:        Behavior: Behavior normal.      Musculoskeletal Exam: ***  CDAI Exam: CDAI Score: -- Patient Global: --; Provider Global: -- Swollen: --; Tender: -- Joint Exam 04/29/2024   No joint exam has been documented for this visit   There is currently no information documented on the homunculus. Go to the Rheumatology activity and complete the homunculus joint exam.  Investigation: No additional findings.  Imaging: XR Shoulder Left Result Date: 04/11/2024 No glenohumeral or acromioclavicular joint space narrowing was noted.  No chondrocalcinosis was noted. Impression: Unremarkable x-rays of the shoulder.  XR Shoulder Right Result Date: 04/11/2024 No glenohumeral or acromioclavicular joint space narrowing was noted.  No chondrocalcinosis was noted. Impression: Unremarkable x-rays of the shoulder.  XR Foot 2 Views Left Result Date: 04/11/2024 First MTP, PIP and DIP narrowing was noted.  No intertarsal, tibiotalar or subtalar joint space narrowing was noted.  No erosive changes were noted. Impression: These findings are suggestive of osteoarthritis of the foot.  XR Foot 2 Views Right Result Date: 04/11/2024 First MTP, PIP and DIP narrowing was noted.  No intertarsal, tibiotalar or subtalar joint space narrowing was noted.  No erosive changes were noted. Impression: These findings are suggestive of osteoarthritis of the foot.  XR Hand 2 View Left Result Date: 04/11/2024 Narrowing of PIP, DIP and CMC joint was noted.  No intercarpal or radiocarpal joint space narrowing was noted.  No erosive changes were noted. Impression: These findings are suggestive of osteoarthritis of the hand.  XR Hand 2 View Right Result Date: 04/11/2024 Narrowing of  PIP, DIP and CMC joint was noted.  No intercarpal or radiocarpal joint space narrowing was noted.  No erosive changes were noted. Impression: These findings are suggestive of osteoarthritis of the hand.   Recent Labs: Lab Results  Component Value Date   WBC 11.7 (H) 01/16/2024   HGB 14.2 01/16/2024   PLT 250 01/16/2024   NA 134 (L) 01/16/2024   K 4.0 01/16/2024   CL 103 01/16/2024   CO2 25 01/16/2024   GLUCOSE 105 (H) 01/16/2024   BUN 28 (H) 01/16/2024   CREATININE 1.32 (H) 01/16/2024   BILITOT 0.7 01/16/2024   ALKPHOS 75 01/16/2024   AST 26 01/16/2024   ALT 26 01/16/2024   PROT 7.5 01/16/2024   ALBUMIN 4.6 01/16/2024   CALCIUM 9.3 01/16/2024   GFRAA >60 07/13/2020    Speciality Comments: No specialty comments available.  Procedures:  No  procedures performed Allergies: Lisinopril-hydrochlorothiazide, Meloxicam, Metoprolol , Celebrex [celecoxib], Duloxetine, Percocet [oxycodone -acetaminophen ], Sulfa antibiotics, Sulfonamide derivatives, Tetracycline, and Latex   Assessment / Plan:     Visit Diagnoses: Rheumatoid arthritis involving multiple sites, unspecified whether rheumatoid factor present (HCC)  S/P total knee arthroplasty, right  S/P TKR (total knee replacement), left  Primary osteoarthritis involving multiple joints  Restrictive lung disease  Primary hypertension  Mild intermittent asthma without complication  Moderate mixed hyperlipidemia not requiring statin therapy  Iron deficiency anemia due to chronic blood loss  Lymphocytosis  Palpitations  History of type 2 diabetes mellitus  Orders: No orders of the defined types were placed in this encounter.  No orders of the defined types were placed in this encounter.   Face-to-face time spent with patient was *** minutes. Greater than 50% of time was spent in counseling and coordination of care.  Follow-Up Instructions: No follow-ups on file.   Waddell CHRISTELLA Craze, PA-C  Note - This record has been  created using Dragon software.  Chart creation errors have been sought, but may not always  have been located. Such creation errors do not reflect on  the standard of medical care.

## 2024-04-29 ENCOUNTER — Ambulatory Visit: Admitting: Physician Assistant

## 2024-04-29 DIAGNOSIS — R002 Palpitations: Secondary | ICD-10-CM

## 2024-04-29 DIAGNOSIS — M150 Primary generalized (osteo)arthritis: Secondary | ICD-10-CM

## 2024-04-29 DIAGNOSIS — M19071 Primary osteoarthritis, right ankle and foot: Secondary | ICD-10-CM

## 2024-04-29 DIAGNOSIS — Z8639 Personal history of other endocrine, nutritional and metabolic disease: Secondary | ICD-10-CM

## 2024-04-29 DIAGNOSIS — I10 Essential (primary) hypertension: Secondary | ICD-10-CM

## 2024-04-29 DIAGNOSIS — D500 Iron deficiency anemia secondary to blood loss (chronic): Secondary | ICD-10-CM

## 2024-04-29 DIAGNOSIS — E782 Mixed hyperlipidemia: Secondary | ICD-10-CM

## 2024-04-29 DIAGNOSIS — M19041 Primary osteoarthritis, right hand: Secondary | ICD-10-CM

## 2024-04-29 DIAGNOSIS — D72820 Lymphocytosis (symptomatic): Secondary | ICD-10-CM

## 2024-04-29 DIAGNOSIS — Z8739 Personal history of other diseases of the musculoskeletal system and connective tissue: Secondary | ICD-10-CM

## 2024-04-29 DIAGNOSIS — R768 Other specified abnormal immunological findings in serum: Secondary | ICD-10-CM

## 2024-04-29 DIAGNOSIS — Z96652 Presence of left artificial knee joint: Secondary | ICD-10-CM

## 2024-04-29 DIAGNOSIS — Z96651 Presence of right artificial knee joint: Secondary | ICD-10-CM

## 2024-04-29 DIAGNOSIS — R5383 Other fatigue: Secondary | ICD-10-CM

## 2024-04-29 DIAGNOSIS — J4520 Mild intermittent asthma, uncomplicated: Secondary | ICD-10-CM

## 2024-04-29 DIAGNOSIS — G8929 Other chronic pain: Secondary | ICD-10-CM

## 2024-04-29 DIAGNOSIS — J984 Other disorders of lung: Secondary | ICD-10-CM

## 2024-04-29 DIAGNOSIS — M069 Rheumatoid arthritis, unspecified: Secondary | ICD-10-CM

## 2024-04-30 ENCOUNTER — Ambulatory Visit: Payer: Self-pay | Admitting: Emergency Medicine

## 2024-04-30 NOTE — Progress Notes (Deleted)
 Office Visit Note  Patient: Andre Turner             Date of Birth: [birth year]           MRN: [id number]             PCP: Anice Wilbert BIRCH, NP Referring: Anice Wilbert BIRCH, NP Visit Date: 05/02/2024 Occupation: @GUAROCC @  Subjective:    History of Present Illness: Damiean Lukes is a 63 y.o. male with history of osteoarthritis.  Patient presents today to discuss results from the initial office visit.   XR OA  Ultrasound?   Prednisone ?   Activities of Daily Living:  Patient reports morning stiffness for *** {minute/hour:19697}.   Patient {ACTIONS;DENIES/REPORTS:21021675::Denies} nocturnal pain.  Difficulty dressing/grooming: {ACTIONS;DENIES/REPORTS:21021675::Denies} Difficulty climbing stairs: {ACTIONS;DENIES/REPORTS:21021675::Denies} Difficulty getting out of chair: {ACTIONS;DENIES/REPORTS:21021675::Denies} Difficulty using hands for taps, buttons, cutlery, and/or writing: {ACTIONS;DENIES/REPORTS:21021675::Denies}  No Rheumatology ROS completed.   PMFS History:  Patient Active Problem List   Diagnosis Date Noted  . Rheumatoid arthritis (HCC) 02/01/2024  . Preoperative respiratory examination 12/22/2023  . Palpitations 12/22/2023  . Snoring 12/22/2023  . Osteoarthritis (arthritis due to wear and tear of joints) 10/19/2022  . Lymphocytosis 06/10/2022  . Iron deficiency anemia 06/10/2022  . Abnormal CT of the chest 04/08/2021  . Restrictive lung disease 08/17/2020  . Shortness of breath 05/21/2020  . Hypertension 07/13/2011  . Hyperlipidemia 07/13/2011  . Skin lesions, generalized 06/05/2011  . Erectile dysfunction 06/05/2011  . Allergic rhinitis 06/25/2007  . Mild intermittent asthma 06/25/2007  . Headache 06/25/2007  . BENIGN PROSTATIC HYPERTROPHY, HX OF 06/25/2007    Past Medical History:  Diagnosis Date  . Allergic rhinitis   . Anemia   . Arthritis    rhuematoid and osteo arthritis  . Cataract   . Chronic headache     migraines  . Diverticulitis   . GERD (gastroesophageal reflux disease)   . History of benign prostatic hypertrophy   . Hyperlipidemia   . Hypertension   . Restless leg   . Tricuspid regurgitation   . Unspecified asthma(493.90)     Family History  Problem Relation Age of Onset  . Colon cancer Mother   . Lung cancer Mother   . Obesity Mother   . Prostate cancer Father   . Hypertension Father   . Heart failure Father   . Leukemia Father   . Healthy Sister   . Colon cancer Maternal Grandmother   . Diabetes Other        FH- maternal side  . Prostate cancer Other        FH-maternal side  . Esophageal cancer Neg Hx   . Rectal cancer Neg Hx   . Stomach cancer Neg Hx    Past Surgical History:  Procedure Laterality Date  . COLONOSCOPY    . INGUINAL HERNIA REPAIR    . KNEE ARTHROSCOPY Right 11/18/2022  . PATELLA-FEMORAL ARTHROPLASTY Left 07/14/2016   Procedure: LEFT KNEE PATELLA-FEMORAL  REPLACEMENT ARTHROPLASTY;  Surgeon: Toribio JULIANNA Chancy, MD;  Location: [facility];  Service: Orthopedics;  Laterality: Left;  . REPLACEMENT TOTAL KNEE Left 01/24/2022  . REPLACEMENT TOTAL KNEE Right 02/21/2024  . TENDON REPAIR    . TONSILLECTOMY     Social History   Social History Narrative   HSG, working on college degree      single. Was employed at [employer] until December.         Immunization History  Administered Date(s) Administered  . Tdap 02/19/2007  Objective: Vital Signs: There were no vitals taken for this visit.   Physical Exam Vitals and nursing note reviewed.  Constitutional:      Appearance: He is well-developed.  HENT:     Head: Normocephalic and atraumatic.  Eyes:     Conjunctiva/sclera: Conjunctivae normal.     Pupils: Pupils are equal, round, and reactive to light.  Cardiovascular:     Rate and Rhythm: Normal rate and regular rhythm.     Heart sounds: Normal heart sounds.  Pulmonary:     Effort: Pulmonary effort is normal.     Breath sounds:  Normal breath sounds.  Abdominal:     General: Bowel sounds are normal.     Palpations: Abdomen is soft.  Musculoskeletal:     Cervical back: Normal range of motion and neck supple.  Skin:    General: Skin is warm and dry.     Capillary Refill: Capillary refill takes less than 2 seconds.  Neurological:     Mental Status: He is alert and oriented to person, place, and time.  Psychiatric:        Behavior: Behavior normal.      Musculoskeletal Exam: ***  CDAI Exam: CDAI Score: -- Patient Global: --; Provider Global: -- Swollen: --; Tender: -- Joint Exam 05/02/2024   No joint exam has been documented for this visit   There is currently no information documented on the homunculus. Go to the Rheumatology activity and complete the homunculus joint exam.  Investigation: No additional findings.  Imaging: XR Shoulder Left Result Date: 04/11/2024 No glenohumeral or acromioclavicular joint space narrowing was noted.  No chondrocalcinosis was noted. Impression: Unremarkable x-rays of the shoulder.  XR Shoulder Right Result Date: 04/11/2024 No glenohumeral or acromioclavicular joint space narrowing was noted.  No chondrocalcinosis was noted. Impression: Unremarkable x-rays of the shoulder.  XR Foot 2 Views Left Result Date: 04/11/2024 First MTP, PIP and DIP narrowing was noted.  No intertarsal, tibiotalar or subtalar joint space narrowing was noted.  No erosive changes were noted. Impression: These findings are suggestive of osteoarthritis of the foot.  XR Foot 2 Views Right Result Date: 04/11/2024 First MTP, PIP and DIP narrowing was noted.  No intertarsal, tibiotalar or subtalar joint space narrowing was noted.  No erosive changes were noted. Impression: These findings are suggestive of osteoarthritis of the foot.  XR Hand 2 View Left Result Date: 04/11/2024 Narrowing of PIP, DIP and CMC joint was noted.  No intercarpal or radiocarpal joint space narrowing was noted.  No erosive changes  were noted. Impression: These findings are suggestive of osteoarthritis of the hand.  XR Hand 2 View Right Result Date: 04/11/2024 Narrowing of PIP, DIP and CMC joint was noted.  No intercarpal or radiocarpal joint space narrowing was noted.  No erosive changes were noted. Impression: These findings are suggestive of osteoarthritis of the hand.   Recent Labs: Lab Results  Component Value Date   WBC 11.7 (H) 01/16/2024   HGB 14.2 01/16/2024   PLT 250 01/16/2024   NA 134 (L) 01/16/2024   K 4.0 01/16/2024   CL 103 01/16/2024   CO2 25 01/16/2024   GLUCOSE 105 (H) 01/16/2024   BUN 28 (H) 01/16/2024   CREATININE 1.32 (H) 01/16/2024   BILITOT 0.7 01/16/2024   ALKPHOS 75 01/16/2024   AST 26 01/16/2024   ALT 26 01/16/2024   PROT 7.5 01/16/2024   ALBUMIN 4.6 01/16/2024   CALCIUM 9.3 01/16/2024   GFRAA >60 07/13/2020  Speciality Comments: No specialty comments available.  Procedures:  No procedures performed Allergies: Lisinopril-hydrochlorothiazide, Meloxicam, Metoprolol , Celebrex [celecoxib], Duloxetine, Percocet [oxycodone -acetaminophen ], Sulfa antibiotics, Sulfonamide derivatives, Tetracycline, and Latex   Assessment / Plan:     Visit Diagnoses: Rheumatoid arthritis involving multiple sites, unspecified whether rheumatoid factor present (HCC)  S/P total knee arthroplasty, right  S/P TKR (total knee replacement), left  Primary osteoarthritis involving multiple joints  History of gout  Restrictive lung disease  Primary hypertension  Mild intermittent asthma without complication  Moderate mixed hyperlipidemia not requiring statin therapy  Iron deficiency anemia due to chronic blood loss  Lymphocytosis  Palpitations  History of type 2 diabetes mellitus  Other fatigue  Orders: No orders of the defined types were placed in this encounter.  No orders of the defined types were placed in this encounter.   Face-to-face time spent with patient was *** minutes.  Greater than 50% of time was spent in counseling and coordination of care.  Follow-Up Instructions: No follow-ups on file.   Waddell CHRISTELLA Craze, PA-C  Note - This record has been created using Dragon software.  Chart creation errors have been sought, but may not always  have been located. Such creation errors do not reflect on  the standard of medical care.

## 2024-04-30 NOTE — Telephone Encounter (Signed)
 Please set him up w OV w RB or any APP as an acute visit

## 2024-04-30 NOTE — Telephone Encounter (Signed)
 FYI Only or Action Required?: Action required by provider: request for appointment.  Patient is followed in Pulmonology for asthma, restrictive lung disease, last seen on 02/01/2024 by Shelah Lamar RAMAN, MD.  Called Nurse Triage reporting Shortness of Breath.  Symptoms began several weeks ago.  Interventions attempted: Rescue inhaler, Maintenance inhaler, and Increased fluids/rest.  Symptoms are: unchanged.  Triage Disposition: See PCP Within 2 Weeks-needing a follow up call to get scheduled to see pulmonary.   Patient/caregiver understands and will follow disposition?: No, wishes to speak with PCP  Copied from CRM [id number]. Topic: Clinical - Red Word Triage >> Apr 30, 2024 11:13 AM Rozanna MATSU wrote: [facility] that prompted transfer to Nurse Triage: PT STATED HE IS HAVING SHORTNESS OF BREATHE, STATED HIS PCP TOLD HIM  HAS PNEUMONIA AND HE IS HAVING ISSUES WITH HIS NODULES PER XRAYS DONE AND SOME CONGESTIONS. STATED THE LAST 4 DAYS IT FLARED UP Reason for Disposition  [1] MILD longstanding difficulty breathing (e.g., minimal/no SOB at rest, SOB with walking, pulse < 100) AND [2] SAME as normal  Answer Assessment - Initial Assessment Questions 1. RESPIRATORY STATUS: Describe your breathing? (e.g., wheezing, shortness of breath, unable to speak, severe coughing)      Shortness of breath 2. ONSET: When did this breathing problem begin?      Patient reports shortness of breath worsened over the weekend while he was outside 3. PATTERN Does the difficult breathing come and go, or has it been constant since it started?      Comes and goes 4. SEVERITY: How bad is your breathing? (e.g., mild, moderate, severe)      Currently shortness of breath is mild.  5. RECURRENT SYMPTOM: Have you had difficulty breathing before? If Yes, ask: When was the last time? and What happened that time?      yes 6. CARDIAC HISTORY: Do you have any history of heart disease? (e.g., heart attack, angina,  bypass surgery, angioplasty)      no 7. LUNG HISTORY: Do you have any history of lung disease?  (e.g., pulmonary embolus, asthma, emphysema)     Restrictive lung disease 8. CAUSE: What do you think is causing the breathing problem?      unsure 9. OTHER SYMPTOMS: Do you have any other symptoms? (e.g., chest pain, cough, dizziness, fever, runny nose)     no 10. O2 SATURATION MONITOR:  Do you use an oxygen saturation monitor (pulse oximeter) at home? If Yes, ask: What is your reading (oxygen level) today? What is your usual oxygen saturation reading? (e.g., 95%)       N/A-patient reports he doesn't have a pulse oximeter. Patient given information on where to find a pulse oximeter 12. TRAVEL: Have you traveled out of the country in the last month? (e.g., travel history, exposures)       no  Patient reports going to his PCP about three weeks ago and diagnosed with PNA. Patient reports being placed on antibiotics (Erythromycin and Levofloxacin ) and prednisone . Reports PCP did another chest XR that showed a possible nodule to right upper lobe. PCP told patient that he would need a CT scan of his chest but told patient that pulmonary would make that decision. Patient with continued shortness of breath but is speaking in full sentences without distress. Offered patient an acute appointment option today with an available APP but patient refused due to availability. Patient is needing follow call in regards to an appointment.  Protocols used: Breathing Difficulty-A-AH

## 2024-05-01 ENCOUNTER — Encounter: Payer: Self-pay | Admitting: Pulmonary Disease

## 2024-05-01 ENCOUNTER — Ambulatory Visit: Admitting: Pulmonary Disease

## 2024-05-01 VITALS — BP 125/71 | HR 90 | Temp 98.6°F | Ht 68.0 in | Wt 266.4 lb

## 2024-05-01 DIAGNOSIS — J4541 Moderate persistent asthma with (acute) exacerbation: Secondary | ICD-10-CM

## 2024-05-01 DIAGNOSIS — J189 Pneumonia, unspecified organism: Secondary | ICD-10-CM

## 2024-05-01 DIAGNOSIS — Z87891 Personal history of nicotine dependence: Secondary | ICD-10-CM | POA: Diagnosis not present

## 2024-05-01 MED ORDER — PREDNISONE 10 MG PO TABS: ORAL_TABLET | ORAL | 0 refills | Status: AC

## 2024-05-01 MED ORDER — BREZTRI AEROSPHERE 160-9-4.8 MCG/ACT IN AERO: INHALATION_SPRAY | RESPIRATORY_TRACT | Status: DC

## 2024-05-01 NOTE — Patient Instructions (Signed)
 Take prednisone  as prescribed, 40 mg once a day for 5 days then 20 mg once a day for 5 days then 10 mg once a day for 5 days then stop  Use the inhaler Breztri  2 puffs twice a day every day, rinse your mouth out thoroughly with water after every use  Each sample will last 1 week, I provided for samples  Return to clinic in 4 weeks with APP or Dr. Shelah

## 2024-05-01 NOTE — Progress Notes (Signed)
 @Patient  ID: Andre Turner, male    DOB: 12-25-1960, 63 y.o.   MRN: [id number]  Chief Complaint  Patient presents with   Shortness of Breath    Referring provider: Anice Wilbert BIRCH, NP  HPI:   63 y.o. man history of asthma whom are seeing as an acute visit after recent diagnosed with pneumonia with ongoing dyspnea on exertion.    Got sick earlier this month.  Saw his PCP 04/09/2024.  Chest x-ray obtained rib view of report demonstrates right upper lobe nodularity.  Diagnosed with pneumonia.  He had increased cough shortness of breath.  Prescribed levofloxacin .  Cough is improved some lingering.  Also prednisone .  Shortness of breath and cough improved although now increasing shortness of breath again.  He has not been using his maintenance inhaler, Symbicort , was using as needed and it appears that is quite expensive, at the mail order pharmacy so would take some time to get it.  Stress likely ongoing asthma exacerbation triggered by viral versus bacterial pneumonia.  Recommend increase or longer steroid taper.  Needs repeat chest x-ray at 6-week mark, in about 4 weeks from today.  Questionaires / Pulmonary Flowsheets:   ACT:      No data to display          MMRC:     No data to display          Epworth:      No data to display          Tests:   FENO:  Lab Results  Component Value Date   NITRICOXIDE 12 08/14/2020    PFT:    Latest Ref Rng & Units 08/14/2020   12:01 PM  PFT Results  FVC-Pre L 3.11   FVC-Predicted Pre % 72   FVC-Post L 3.08   FVC-Predicted Post % 71   Pre FEV1/FVC % % 84   Post FEV1/FCV % % 87   FEV1-Pre L 2.62   FEV1-Predicted Pre % 80   FEV1-Post L 2.69   DLCO uncorrected ml/min/mmHg 23.84   DLCO UNC% % 94   DLCO corrected ml/min/mmHg 25.31   DLCO COR %Predicted % 100   DLVA Predicted % 113   TLC L 5.29   TLC % Predicted % 82   RV % Predicted % 97   Personally reviewed and interpreted as: spirometry within normal  limits, lung volumes within normal limits, DLCO within normal limits  WALK:      No data to display          Imaging: Personally reviewed and as per EMR and discussion in this note XR Shoulder Left Result Date: 04/11/2024 No glenohumeral or acromioclavicular joint space narrowing was noted.  No chondrocalcinosis was noted. Impression: Unremarkable x-rays of the shoulder.  XR Shoulder Right Result Date: 04/11/2024 No glenohumeral or acromioclavicular joint space narrowing was noted.  No chondrocalcinosis was noted. Impression: Unremarkable x-rays of the shoulder.  XR Foot 2 Views Left Result Date: 04/11/2024 First MTP, PIP and DIP narrowing was noted.  No intertarsal, tibiotalar or subtalar joint space narrowing was noted.  No erosive changes were noted. Impression: These findings are suggestive of osteoarthritis of the foot.  XR Foot 2 Views Right Result Date: 04/11/2024 First MTP, PIP and DIP narrowing was noted.  No intertarsal, tibiotalar or subtalar joint space narrowing was noted.  No erosive changes were noted. Impression: These findings are suggestive of osteoarthritis of the foot.  XR Hand 2 View Left Result Date: 04/11/2024  Narrowing of PIP, DIP and CMC joint was noted.  No intercarpal or radiocarpal joint space narrowing was noted.  No erosive changes were noted. Impression: These findings are suggestive of osteoarthritis of the hand.  XR Hand 2 View Right Result Date: 04/11/2024 Narrowing of PIP, DIP and CMC joint was noted.  No intercarpal or radiocarpal joint space narrowing was noted.  No erosive changes were noted. Impression: These findings are suggestive of osteoarthritis of the hand.   Lab Results: Personally reviewed CBC    Component Value Date/Time   WBC 11.7 (H) 01/16/2024 1458   WBC 11.9 (H) 07/23/2023 1506   RBC 4.45 01/16/2024 1458   HGB 14.2 01/16/2024 1458   HGB 13.1 12/08/2023 1517   HCT 40.2 01/16/2024 1458   HCT 38.2 12/08/2023 1517   PLT 250  01/16/2024 1458   PLT 285 12/08/2023 1517   MCV 90.3 01/16/2024 1458   MCV 95 12/08/2023 1517   MCH 31.9 01/16/2024 1458   MCHC 35.3 01/16/2024 1458   RDW 12.4 01/16/2024 1458   RDW 12.1 12/08/2023 1517   LYMPHSABS 6.2 (H) 01/16/2024 1458   MONOABS 0.6 01/16/2024 1458   EOSABS 0.2 01/16/2024 1458   BASOSABS 0.1 01/16/2024 1458    BMET    Component Value Date/Time   NA 134 (L) 01/16/2024 1458   NA 139 12/08/2023 1517   K 4.0 01/16/2024 1458   CL 103 01/16/2024 1458   CO2 25 01/16/2024 1458   GLUCOSE 105 (H) 01/16/2024 1458   BUN 28 (H) 01/16/2024 1458   BUN 24 12/08/2023 1517   CREATININE 1.32 (H) 01/16/2024 1458   CALCIUM 9.3 01/16/2024 1458   GFRNONAA >60 01/16/2024 1458   GFRAA >60 07/13/2020 2050    BNP    Component Value Date/Time   BNP 54.2 07/13/2020 2050    ProBNP    Component Value Date/Time   PROBNP 21 05/27/2020 0906    Specialty Problems       Pulmonary Problems   Allergic rhinitis   Qualifier: Diagnosis of  By: Nickola CMA, [place]   IMO SNOMED Dx Update Oct 2024      Mild intermittent asthma   Qualifier: History of  By: Nickola CMA, [place]        Shortness of breath   Restrictive lung disease   Snoring    Allergies  Allergen Reactions   Lisinopril-Hydrochlorothiazide Shortness Of Breath   Meloxicam Anaphylaxis   Metoprolol  Anaphylaxis   Celebrex [Celecoxib] Itching   Duloxetine Diarrhea    Other Reaction(s): GI Intolerance, Other (See Comments)  REFLUX SEVERE   Percocet [Oxycodone -Acetaminophen ]    Sulfa Antibiotics Nausea And Vomiting and Itching   Sulfonamide Derivatives Hives    REACTION: causes nausea , itchy   Tetracycline Nausea Only   Latex Rash    Immunization History  Administered Date(s) Administered   Tdap 02/19/2007    Past Medical History:  Diagnosis Date   Allergic rhinitis    Allergy 2022   Congestion when sleeping   Anemia    Arthritis    rhuematoid and osteo arthritis   Cataract    Chronic  headache    migraines   Diverticulitis    GERD (gastroesophageal reflux disease)    History of benign prostatic hypertrophy    Hyperlipidemia    Hypertension    Restless leg    Tricuspid regurgitation    Unspecified asthma(493.90)     Tobacco History: Social History   Tobacco Use  Smoking Status Former  Current packs/day: 3.00   Average packs/day: 3.0 packs/day for 1 year (3.0 ttl pk-yrs)   Types: Cigarettes   Passive exposure: Past  Smokeless Tobacco Never  Tobacco Comments   stopped at age 30   Counseling given: Not Answered Tobacco comments: stopped at age 50   Continue to not smoke  Outpatient Encounter Medications as of 05/01/2024  Medication Sig   albuterol  (PROAIR  HFA) 108 (90 Base) MCG/ACT inhaler ProAir  HFA 90 mcg/actuation aerosol inhaler  INHALE 1 TO 2 PUFFS EVERY 4 TO 6 HOURS AS NEEDED FOR WHEEZING (Patient taking differently: as needed. ProAir  HFA 90 mcg/actuation aerosol inhaler  INHALE 1 TO 2 PUFFS EVERY 4 TO 6 HOURS AS NEEDED FOR WHEEZING)   allopurinol (ZYLOPRIM) 100 MG tablet Take 100 mg by mouth daily. (Patient taking differently: Take 200 mg by mouth daily.)   amLODipine  (NORVASC ) 5 MG tablet Take 5 mg by mouth daily.   azithromycin  (ZITHROMAX ) 250 MG tablet Take by mouth.   B Complex-C (B-COMPLEX WITH VITAMIN C) tablet Take 1 tablet by mouth daily.   budesonide -formoterol  (SYMBICORT ) 160-4.5 MCG/ACT inhaler Inhale 2 puffs into the lungs in the morning and at bedtime. Rinse and gargle after each use. (Patient taking differently: Inhale 2 puffs into the lungs as needed. Rinse and gargle after each use.)   budesonide -glycopyrrolate -formoterol  (BREZTRI  AEROSPHERE) 160-9-4.8 MCG/ACT AERO inhaler 4 samples   diphenoxylate-atropine (LOMOTIL) 2.5-0.025 MG tablet Take 1 tablet by mouth daily.   fenofibrate (TRICOR) 48 MG tablet Take 1 tablet by mouth daily.   gabapentin (NEURONTIN) 100 MG capsule Take 200 mg by mouth 2 (two) times daily.    glucosamine-chondroitin 500-400 MG tablet Take by mouth.   HYDROcodone -acetaminophen  (NORCO) 10-325 MG tablet Take 1 tablet by mouth as needed.   Iron-FA-B Cmp-C-Biot-Probiotic (FUSION PLUS) CAPS Take 1 capsule by mouth daily.   levofloxacin  (LEVAQUIN ) 750 MG tablet Take 750 mg by mouth daily.   losartan  (COZAAR ) 100 MG tablet TAKE 1 TABLET(100 MG) BY MOUTH DAILY   NON FORMULARY Cod liver Oil with Vit D - 1000 mg.   NON FORMULARY Prostate Optimizer   predniSONE  (DELTASONE ) 10 MG tablet Take 4 tablets (40 mg total) by mouth daily with breakfast for 5 days, THEN 2 tablets (20 mg total) daily with breakfast for 5 days, THEN 1 tablet (10 mg total) daily with breakfast for 5 days.   rosuvastatin (CRESTOR) 20 MG tablet Take 10 mg by mouth daily.    Specialty Vitamins Products (PROSTATE PO) Take 1 tablet by mouth daily.   UNABLE TO FIND Take 2 tablets by mouth daily. Med Name: muira puama.   [DISCONTINUED] predniSONE  (DELTASONE ) 10 MG tablet SMARTSIG:- Tablet(s) By Mouth -   No facility-administered encounter medications on file as of 05/01/2024.     Review of Systems  Review of Systems  No chest pain with exertion orthopnea or PND comprehensive review of systems otherwise negative. Physical Exam  BP 125/71   Pulse 90   Temp 98.6 F (37 C) (Temporal)   Ht 5' 8 (1.727 m)   Wt 266 lb 6.4 oz (120.8 kg)   SpO2 97%   BMI 40.51 kg/m   Wt Readings from Last 5 Encounters:  05/01/24 266 lb 6.4 oz (120.8 kg)  04/11/24 252 lb (114.3 kg)  02/01/24 267 lb 6.4 oz (121.3 kg)  01/29/24 265 lb (120.2 kg)  01/16/24 265 lb 12.8 oz (120.6 kg)    BMI Readings from Last 5 Encounters:  05/01/24 40.51 kg/m  04/11/24 38.89 kg/m  02/01/24 41.26 kg/m  01/29/24 41.50 kg/m  01/16/24 41.63 kg/m     Physical Exam General: Sitting in chair, no acute distress Eyes: EOMI, no icterus Neck: Supple, no JVP Pulmonary: Distant, clear, decreased excursion, no wheeze Cardiovascular: Warm no  edema Abdomen: Nondistended MSK: No synovitis, no joint effusion Neuro: Normal gait, no weakness Psych: Normal mood, full affect   Assessment & Plan:   Exacerbation of asthma: Triggered by viral illness versus pneumonia.  Right upper lobe nodularity noted on chest x-ray 04/09/2024 at outside facility.  Cough is improved overall with levofloxacin .  Initial prednisone  course helped.  Prolonged prednisone  taper today.  He has been without his maintenance inhaler due to cost.  Breztri  samples today while ill.  Need to settle out or sort out long-term inhaler plan in the future.  Right upper lobe pneumonia: Repeat x-ray in 4 weeks at next visit, will be around 6 weeks from pneumonia diagnosis, to ensure resolution.   Return in about 4 weeks (around 05/29/2024) for f/u with APP or Dr. Shelah.   Donnice JONELLE Beals, MD 05/01/2024

## 2024-05-01 NOTE — Telephone Encounter (Signed)
 Called and spoke with the patient. He has been scheduled acute ov for today with Dr. Annella

## 2024-05-02 ENCOUNTER — Ambulatory Visit: Admitting: Physician Assistant

## 2024-05-02 DIAGNOSIS — Z8639 Personal history of other endocrine, nutritional and metabolic disease: Secondary | ICD-10-CM

## 2024-05-02 DIAGNOSIS — D500 Iron deficiency anemia secondary to blood loss (chronic): Secondary | ICD-10-CM

## 2024-05-02 DIAGNOSIS — E782 Mixed hyperlipidemia: Secondary | ICD-10-CM

## 2024-05-02 DIAGNOSIS — J4520 Mild intermittent asthma, uncomplicated: Secondary | ICD-10-CM

## 2024-05-02 DIAGNOSIS — J984 Other disorders of lung: Secondary | ICD-10-CM

## 2024-05-02 DIAGNOSIS — Z96652 Presence of left artificial knee joint: Secondary | ICD-10-CM

## 2024-05-02 DIAGNOSIS — D72820 Lymphocytosis (symptomatic): Secondary | ICD-10-CM

## 2024-05-02 DIAGNOSIS — I10 Essential (primary) hypertension: Secondary | ICD-10-CM

## 2024-05-02 DIAGNOSIS — R5383 Other fatigue: Secondary | ICD-10-CM

## 2024-05-02 DIAGNOSIS — M150 Primary generalized (osteo)arthritis: Secondary | ICD-10-CM

## 2024-05-02 DIAGNOSIS — Z96651 Presence of right artificial knee joint: Secondary | ICD-10-CM

## 2024-05-02 DIAGNOSIS — Z8739 Personal history of other diseases of the musculoskeletal system and connective tissue: Secondary | ICD-10-CM

## 2024-05-02 DIAGNOSIS — R002 Palpitations: Secondary | ICD-10-CM

## 2024-05-02 DIAGNOSIS — M069 Rheumatoid arthritis, unspecified: Secondary | ICD-10-CM

## 2024-05-08 NOTE — Progress Notes (Unsigned)
 Office Visit Note  Patient: Andre Turner             Date of Birth: 08/31/1961           MRN: [id number]             PCP: Sherre Geni LABOR, NP Referring: Sherre Geni LABOR, NP Visit Date: 05/09/2024 Occupation: @GUAROCC @  Subjective:  Discuss results   History of Present Illness: Andre Turner is a 63 y.o. male with a history of polyarthralgia.  Patient presents today to discuss x-ray and lab results from his initial office visit.  Patient states he continues to have intermittent pain and stiffness in both shoulders, both hands, and both feet.  Patient states that he takes hydrocodone  as needed mainly for neuropathy relief.  Patient states he is currently on a prednisone  taper prescribed by his pulmonologist and states that occasionally prednisone  will help alleviate his joint pain.  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  He denies any signs or symptoms of a gout flare.  He is taking allopurinol 200 mg daily.  Activities of Daily Living:  Patient reports morning stiffness for 30 minutes.   Patient Reports nocturnal pain.  Difficulty dressing/grooming: Denies Difficulty climbing stairs: Denies Difficulty getting out of chair: Denies Difficulty using hands for taps, buttons, cutlery, and/or writing: Reports  Review of Systems  Constitutional:  Positive for fatigue.  HENT:  Positive for mouth dryness. Negative for mouth sores.   Eyes:  Negative for dryness.  Respiratory:  Positive for shortness of breath.   Cardiovascular:  Negative for chest pain and palpitations.  Gastrointestinal:  Positive for diarrhea. Negative for blood in stool and constipation.  Endocrine: Negative for increased urination.  Genitourinary:  Negative for involuntary urination.  Musculoskeletal:  Positive for joint pain, joint pain, joint swelling, myalgias, morning stiffness and myalgias. Negative for gait problem, muscle weakness and muscle tenderness.  Skin:   Positive for rash. Negative for color change, hair loss and sensitivity to sunlight.  Allergic/Immunologic: Negative for susceptible to infections.  Neurological:  Negative for dizziness and headaches.  Hematological:  Negative for swollen glands.  Psychiatric/Behavioral:  Positive for sleep disturbance. Negative for depressed mood. The patient is not nervous/anxious.     PMFS History:  Patient Active Problem List   Diagnosis Date Noted   Rheumatoid arthritis (HCC) 02/01/2024   Preoperative respiratory examination 12/22/2023   Palpitations 12/22/2023   Snoring 12/22/2023   Osteoarthritis (arthritis due to wear and tear of joints) 10/19/2022   Lymphocytosis 06/10/2022   Iron deficiency anemia 06/10/2022   Abnormal CT of the chest 04/08/2021   Restrictive lung disease 08/17/2020   Shortness of breath 05/21/2020   Hypertension 07/13/2011   Hyperlipidemia 07/13/2011   Skin lesions, generalized 06/05/2011   Erectile dysfunction 06/05/2011   Allergic rhinitis 06/25/2007   Mild intermittent asthma 06/25/2007   Headache 06/25/2007   BENIGN PROSTATIC HYPERTROPHY, HX OF 06/25/2007    Past Medical History:  Diagnosis Date   Allergic rhinitis    Allergy 2022   Congestion when sleeping   Anemia    Arthritis    rhuematoid and osteo arthritis   Cataract    Chronic headache    migraines   Diverticulitis    GERD (gastroesophageal reflux disease)    History of benign prostatic hypertrophy    Hyperlipidemia    Hypertension    Restless leg    Tricuspid regurgitation    Unspecified asthma(493.90)  Family History  Problem Relation Age of Onset   Colon cancer Mother    Lung cancer Mother    Obesity Mother    Prostate cancer Father    Hypertension Father    Heart failure Father    Leukemia Father    Varicose Veins Sister    Colon cancer Maternal Grandmother    Diabetes Other        FH- maternal side   Prostate cancer Other        FH-maternal side   Esophageal cancer Neg Hx     Rectal cancer Neg Hx    Stomach cancer Neg Hx    Past Surgical History:  Procedure Laterality Date   COLONOSCOPY     INGUINAL HERNIA REPAIR     KNEE ARTHROSCOPY Right 11/18/2022   PATELLA-FEMORAL ARTHROPLASTY Left 07/14/2016   Procedure: LEFT KNEE PATELLA-FEMORAL  REPLACEMENT ARTHROPLASTY;  Surgeon: Toribio JULIANNA Chancy, MD;  Location: [facility];  Service: Orthopedics;  Laterality: Left;   REPLACEMENT TOTAL KNEE Left 01/24/2022   REPLACEMENT TOTAL KNEE Right 02/21/2024   TENDON REPAIR     TONSILLECTOMY     Social History   Social History Narrative   HS[employer], working on college degree      single. Was employed at [employer] until December.         Immunization History  Administered Date(s) Administered   Tdap 02/19/2007     Objective: Vital Signs: BP 138/69 (BP Location: Left Arm, Patient Position: Sitting, Cuff Size: Normal)   Pulse 85   Resp 16   Ht 5' 8 (1.727 m)   Wt 264 lb 9.6 oz (120 kg)   BMI 40.23 kg/m    Physical Exam Vitals and nursing note reviewed.  Constitutional:      Appearance: He is well-developed.  HENT:     Head: Normocephalic and atraumatic.  Eyes:     Conjunctiva/sclera: Conjunctivae normal.     Pupils: Pupils are equal, round, and reactive to light.  Cardiovascular:     Rate and Rhythm: Normal rate and regular rhythm.     Heart sounds: Normal heart sounds.  Pulmonary:     Effort: Pulmonary effort is normal.     Breath sounds: Wheezing present.  Abdominal:     General: Bowel sounds are normal.     Palpations: Abdomen is soft.  Musculoskeletal:     Cervical back: Normal range of motion and neck supple.  Skin:    General: Skin is warm and dry.     Capillary Refill: Capillary refill takes less than 2 seconds.  Neurological:     Mental Status: He is alert and oriented to person, place, and time.  Psychiatric:        Behavior: Behavior normal.      Musculoskeletal Exam: C-spine has limited range of motion with lateral rotation.   Shoulder joints have some discomfort and stiffness with range of motion.  Elbow joints have good range of motion with no tenderness along the joint line.  No tenderness or synovitis over the wrist joints or MCP joints.  PIP and DIP thickening consistent with osteoarthritis of both hands.  Limited flexion extension of the right knee replacement.  Left knee replacement has good range of motion.  Pedal edema noted in bilateral lower extremities.  CDAI Exam: CDAI Score: -- Patient Global: --; Provider Global: -- Swollen: --; Tender: -- Joint Exam 05/09/2024   No joint exam has been documented for this visit   There is currently  no information documented on the homunculus. Go to the Rheumatology activity and complete the homunculus joint exam.  Investigation: No additional findings.  Imaging: XR Shoulder Left Result Date: 04/11/2024 No glenohumeral or acromioclavicular joint space narrowing was noted.  No chondrocalcinosis was noted. Impression: Unremarkable x-rays of the shoulder.  XR Shoulder Right Result Date: 04/11/2024 No glenohumeral or acromioclavicular joint space narrowing was noted.  No chondrocalcinosis was noted. Impression: Unremarkable x-rays of the shoulder.  XR Foot 2 Views Left Result Date: 04/11/2024 First MTP, PIP and DIP narrowing was noted.  No intertarsal, tibiotalar or subtalar joint space narrowing was noted.  No erosive changes were noted. Impression: These findings are suggestive of osteoarthritis of the foot.  XR Foot 2 Views Right Result Date: 04/11/2024 First MTP, PIP and DIP narrowing was noted.  No intertarsal, tibiotalar or subtalar joint space narrowing was noted.  No erosive changes were noted. Impression: These findings are suggestive of osteoarthritis of the foot.  XR Hand 2 View Left Result Date: 04/11/2024 Narrowing of PIP, DIP and CMC joint was noted.  No intercarpal or radiocarpal joint space narrowing was noted.  No erosive changes were noted. Impression:  These findings are suggestive of osteoarthritis of the hand.  XR Hand 2 View Right Result Date: 04/11/2024 Narrowing of PIP, DIP and CMC joint was noted.  No intercarpal or radiocarpal joint space narrowing was noted.  No erosive changes were noted. Impression: These findings are suggestive of osteoarthritis of the hand.   Recent Labs: Lab Results  Component Value Date   WBC 11.7 (H) 01/16/2024   HGB 14.2 01/16/2024   PLT 250 01/16/2024   NA 134 (L) 01/16/2024   K 4.0 01/16/2024   CL 103 01/16/2024   CO2 25 01/16/2024   GLUCOSE 105 (H) 01/16/2024   BUN 28 (H) 01/16/2024   CREATININE 1.32 (H) 01/16/2024   BILITOT 0.7 01/16/2024   ALKPHOS 75 01/16/2024   AST 26 01/16/2024   ALT 26 01/16/2024   PROT 7.5 01/16/2024   ALBUMIN 4.6 01/16/2024   CALCIUM 9.3 01/16/2024   GFRAA >60 07/13/2020    Speciality Comments: No specialty comments available.  Procedures:  No procedures performed Allergies: Lisinopril-hydrochlorothiazide, Meloxicam, Metoprolol , Celebrex [celecoxib], Duloxetine, Percocet [oxycodone -acetaminophen ], Sulfa antibiotics, Sulfonamide derivatives, Tetracycline, and Latex   Assessment / Plan:     Visit Diagnoses: Polyarthralgia - RF-, Anti-CCP-, ESR WNL, CRP WNL: Reviewed x-ray and lab results from his initial office visit.  X-rays of both hands and both feet are consistent with osteoarthritis.  No erosive changes were noted.  X-rays of both shoulders were unremarkable.  ANA was a low positive titer 1: 40 nuclear, dense fine speckled, rest of workup negative.   No synovitis or dactylitis noted on examination today. Given questionable history of rheumatoid arthritis--plan to schedule an ultrasound of both hands to assess for synovitis.  Primary osteoarthritis of both hands - Clinical and radiographic findings are consistent with osteoarthritis.  RF negative, anti-CCP negative, MCV negative, ESR WNL, CRP WNL.  Reviewed lab results and x-ray results today.  No erosive changes  were noted. PIP and DIP thickening consistent with osteoarthritis of both hands.  No synovitis noted on examination today.  Plan to schedule ultrasound of both hands to assess for synovitis.  Chronic pain of both shoulders - X-rays of both shoulders were unremarkable.  He has good range of motion of both shoulders with some discomfort and stiffness bilaterally.  ESR and CRP WNL on 04/11/2024.   Primary osteoarthritis of both  feet - Clinical and radiographic findings are consistent with osteoarthritis.  RF negative, anti-CCP negative, MCP negative, ESR within normal limits, and CRP WNL.  Patient experiences chronic pain due to neuropathy.  He takes hydrocodone  sparingly for symptomatic relief.  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 Lennon-Currently going to PT once weekly.   Limited flexion and extension.  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 Lennon-doing well.   Positive ANA (antinuclear antibody) - 04/11/24: ANA+ 1:40 nuclear, dense fine speckled.  Plan to check the following lab work today for further evaluation.No clinical features of systemic lupus at this time.  Plan to call him with these results.- Plan: RNP Antibody, Anti-scleroderma antibody, Anti-Smith antibody, Sjogrens syndrome-A extractable nuclear antibody, Sjogrens syndrome-B extractable nuclear antibody, Anti-DNA antibody, double-stranded, C3 and C4, CBC with Differential/Platelet, Comprehensive metabolic panel with GFR, Urinalysis, Routine w reflex microscopic  Primary osteoarthritis involving multiple joints: Plan to schedule an ultrasound of both hands to assess for synovitis.  History of gout: No signs or symptoms of a gout flare.  Uric acid was 4.4 on 04/11/2024.  He is taking allopurinol 200 mg daily.  Other medical conditions are listed as follows:  Restrictive lung disease - Under care of Dr. Annella.  No evidence of ILD.  Currently taking a prednisone  taper.  Primary hypertension - Blood pressure was  138/69 today in the office.  Mild intermittent asthma without complication  Moderate mixed hyperlipidemia not requiring statin therapy  Iron deficiency anemia due to chronic blood loss  Lymphocytosis  Palpitations  History of type 2 diabetes mellitus  Orders: Orders Placed This Encounter  Procedures   RNP Antibody   Anti-scleroderma antibody   Anti-Smith antibody   Sjogrens syndrome-A extractable nuclear antibody   Sjogrens syndrome-B extractable nuclear antibody   Anti-DNA antibody, double-stranded   C3 and C4   CBC with Differential/Platelet   Comprehensive metabolic panel with GFR   Urinalysis, Routine w reflex microscopic   No orders of the defined types were placed in this encounter.    Follow-Up Instructions: Return for Osteoarthritis.   Waddell CHRISTELLA Craze, PA-C  Note - This record has been created using Dragon software.  Chart creation errors have been sought, but may not always  have been located. Such creation errors do not reflect on  the standard of medical care.

## 2024-05-09 ENCOUNTER — Ambulatory Visit: Attending: Physician Assistant | Admitting: Physician Assistant

## 2024-05-09 ENCOUNTER — Encounter: Payer: Self-pay | Admitting: Physician Assistant

## 2024-05-09 VITALS — BP 138/69 | HR 85 | Resp 16 | Ht 68.0 in | Wt 264.6 lb

## 2024-05-09 DIAGNOSIS — E782 Mixed hyperlipidemia: Secondary | ICD-10-CM | POA: Insufficient documentation

## 2024-05-09 DIAGNOSIS — M19042 Primary osteoarthritis, left hand: Secondary | ICD-10-CM | POA: Insufficient documentation

## 2024-05-09 DIAGNOSIS — D72820 Lymphocytosis (symptomatic): Secondary | ICD-10-CM | POA: Diagnosis present

## 2024-05-09 DIAGNOSIS — M19072 Primary osteoarthritis, left ankle and foot: Secondary | ICD-10-CM | POA: Insufficient documentation

## 2024-05-09 DIAGNOSIS — M19041 Primary osteoarthritis, right hand: Secondary | ICD-10-CM | POA: Insufficient documentation

## 2024-05-09 DIAGNOSIS — M25512 Pain in left shoulder: Secondary | ICD-10-CM | POA: Diagnosis present

## 2024-05-09 DIAGNOSIS — J4520 Mild intermittent asthma, uncomplicated: Secondary | ICD-10-CM | POA: Insufficient documentation

## 2024-05-09 DIAGNOSIS — Z96652 Presence of left artificial knee joint: Secondary | ICD-10-CM | POA: Diagnosis present

## 2024-05-09 DIAGNOSIS — I10 Essential (primary) hypertension: Secondary | ICD-10-CM | POA: Insufficient documentation

## 2024-05-09 DIAGNOSIS — M25511 Pain in right shoulder: Secondary | ICD-10-CM | POA: Diagnosis present

## 2024-05-09 DIAGNOSIS — R002 Palpitations: Secondary | ICD-10-CM | POA: Insufficient documentation

## 2024-05-09 DIAGNOSIS — M150 Primary generalized (osteo)arthritis: Secondary | ICD-10-CM | POA: Insufficient documentation

## 2024-05-09 DIAGNOSIS — J984 Other disorders of lung: Secondary | ICD-10-CM | POA: Diagnosis present

## 2024-05-09 DIAGNOSIS — G8929 Other chronic pain: Secondary | ICD-10-CM | POA: Insufficient documentation

## 2024-05-09 DIAGNOSIS — D500 Iron deficiency anemia secondary to blood loss (chronic): Secondary | ICD-10-CM | POA: Diagnosis present

## 2024-05-09 DIAGNOSIS — Z96651 Presence of right artificial knee joint: Secondary | ICD-10-CM | POA: Insufficient documentation

## 2024-05-09 DIAGNOSIS — Z8639 Personal history of other endocrine, nutritional and metabolic disease: Secondary | ICD-10-CM | POA: Diagnosis present

## 2024-05-09 DIAGNOSIS — M2550 Pain in unspecified joint: Secondary | ICD-10-CM | POA: Diagnosis present

## 2024-05-09 DIAGNOSIS — M19071 Primary osteoarthritis, right ankle and foot: Secondary | ICD-10-CM | POA: Insufficient documentation

## 2024-05-09 DIAGNOSIS — M069 Rheumatoid arthritis, unspecified: Secondary | ICD-10-CM

## 2024-05-09 DIAGNOSIS — R768 Other specified abnormal immunological findings in serum: Secondary | ICD-10-CM | POA: Insufficient documentation

## 2024-05-09 DIAGNOSIS — Z8739 Personal history of other diseases of the musculoskeletal system and connective tissue: Secondary | ICD-10-CM | POA: Insufficient documentation

## 2024-05-10 LAB — CBC WITH DIFFERENTIAL/PLATELET
Absolute Lymphocytes: 3627 {cells}/uL (ref 850–3900)
Absolute Monocytes: 581 {cells}/uL (ref 200–950)
Basophils Absolute: 58 {cells}/uL (ref 0–200)
Basophils Relative: 0.7 %
Eosinophils Absolute: 241 {cells}/uL (ref 15–500)
Eosinophils Relative: 2.9 %
HCT: 36.7 % — ABNORMAL LOW (ref 38.5–50.0)
Hemoglobin: 12.3 g/dL — ABNORMAL LOW (ref 13.2–17.1)
MCH: 31.3 pg (ref 27.0–33.0)
MCHC: 33.5 g/dL (ref 32.0–36.0)
MCV: 93.4 fL (ref 80.0–100.0)
MPV: 9.5 fL (ref 7.5–12.5)
Monocytes Relative: 7 %
Neutro Abs: 3793 {cells}/uL (ref 1500–7800)
Neutrophils Relative %: 45.7 %
Platelets: 308 Thousand/uL (ref 140–400)
RBC: 3.93 Million/uL — ABNORMAL LOW (ref 4.20–5.80)
RDW: 13.4 % (ref 11.0–15.0)
Total Lymphocyte: 43.7 %
WBC: 8.3 Thousand/uL (ref 3.8–10.8)

## 2024-05-10 LAB — URINALYSIS, ROUTINE W REFLEX MICROSCOPIC
Bilirubin Urine: NEGATIVE
Glucose, UA: NEGATIVE
Hgb urine dipstick: NEGATIVE
Ketones, ur: NEGATIVE
Leukocytes,Ua: NEGATIVE
Nitrite: NEGATIVE
Protein, ur: NEGATIVE
Specific Gravity, Urine: 1.021 (ref 1.001–1.035)
pH: <=5 — AB (ref 5.0–8.0)

## 2024-05-10 LAB — COMPREHENSIVE METABOLIC PANEL WITH GFR
AG Ratio: 1.9 (calc) (ref 1.0–2.5)
ALT: 19 U/L (ref 9–46)
AST: 22 U/L (ref 10–35)
Albumin: 4.2 g/dL (ref 3.6–5.1)
Alkaline phosphatase (APISO): 78 U/L (ref 35–144)
BUN: 22 mg/dL (ref 7–25)
CO2: 24 mmol/L (ref 20–32)
Calcium: 9.1 mg/dL (ref 8.6–10.3)
Chloride: 102 mmol/L (ref 98–110)
Creat: 0.93 mg/dL (ref 0.70–1.35)
Globulin: 2.2 g/dL (ref 1.9–3.7)
Glucose, Bld: 93 mg/dL (ref 65–99)
Potassium: 4.5 mmol/L (ref 3.5–5.3)
Sodium: 138 mmol/L (ref 135–146)
Total Bilirubin: 0.5 mg/dL (ref 0.2–1.2)
Total Protein: 6.4 g/dL (ref 6.1–8.1)
eGFR: 92 mL/min/1.73m2 (ref >=60)

## 2024-05-10 LAB — C3 AND C4
C3 Complement: 170 mg/dL (ref 82–185)
C4 Complement: 27 mg/dL (ref 15–53)

## 2024-05-10 LAB — SJOGRENS SYNDROME-B EXTRACTABLE NUCLEAR ANTIBODY: SSB (La) (ENA) Antibody, IgG: <1 AI

## 2024-05-10 LAB — RNP ANTIBODY: Ribonucleic Protein(ENA) Antibody, IgG: <1 AI

## 2024-05-10 LAB — ANTI-DNA ANTIBODY, DOUBLE-STRANDED: ds DNA Ab: 1 [IU]/mL

## 2024-05-10 LAB — SJOGRENS SYNDROME-A EXTRACTABLE NUCLEAR ANTIBODY: SSA (Ro) (ENA) Antibody, IgG: <1 AI

## 2024-05-10 LAB — ANTI-SCLERODERMA ANTIBODY: Scleroderma (Scl-70) (ENA) Antibody, IgG: <1 AI

## 2024-05-10 LAB — ANTI-SMITH ANTIBODY: ENA SM Ab Ser-aCnc: <1 AI

## 2024-05-13 ENCOUNTER — Ambulatory Visit: Payer: Self-pay | Admitting: Physician Assistant

## 2024-05-16 ENCOUNTER — Other Ambulatory Visit: Admitting: Rheumatology

## 2024-05-21 ENCOUNTER — Telehealth: Payer: Self-pay | Admitting: *Deleted

## 2024-05-21 NOTE — Telephone Encounter (Signed)
 Patient contacted the office stating he is currently on Prednisone . Patient states he is due to finish it in 6 days. Patient states he would like to know how long he should be off of the Prednisone  before scheduling the ultrasound of his hands. Please advise.

## 2024-05-21 NOTE — Telephone Encounter (Signed)
 Attempted to contact patient and left message to advise patient to call the office and schedule ultrasound appointment.

## 2024-05-21 NOTE — Telephone Encounter (Signed)
 Dr. Dolphus recommends being off of prednisone  for 2 weeks

## 2024-05-21 NOTE — Telephone Encounter (Signed)
 Spoke with patient to advise Dr. Dolphus recommends being off of prednisone  for 2 weeks. Patient completes Prednisone  on 05/27/2024. Unable to schedule first week of September due to Reena being out of the office. Please reach out to the patient when Dr. Dolphus has an appointment that opens up. Thanks!

## 2024-06-03 ENCOUNTER — Ambulatory Visit

## 2024-06-03 VITALS — BP 158/76 | HR 94 | Temp 98.8°F | Ht 68.0 in | Wt 269.2 lb

## 2024-06-03 DIAGNOSIS — J4541 Moderate persistent asthma with (acute) exacerbation: Secondary | ICD-10-CM

## 2024-06-03 MED ORDER — ALBUTEROL SULFATE HFA 108 (90 BASE) MCG/ACT IN AERS: INHALATION_SPRAY | RESPIRATORY_TRACT | 0 refills | Status: AC

## 2024-06-03 MED ORDER — BENZONATATE 200 MG PO CAPS: 200.0000 mg | ORAL_CAPSULE | Freq: Three times a day (TID) | ORAL | 1 refills | Status: DC | PRN

## 2024-06-03 MED ORDER — ALBUTEROL SULFATE HFA 108 (90 BASE) MCG/ACT IN AERS: INHALATION_SPRAY | RESPIRATORY_TRACT | 0 refills | Status: DC

## 2024-06-03 MED ORDER — GUAIFENESIN ER 600 MG PO TB12: 600.0000 mg | ORAL_TABLET | Freq: Two times a day (BID) | ORAL | 2 refills | Status: DC

## 2024-06-03 MED ORDER — AZITHROMYCIN 250 MG PO TABS: ORAL_TABLET | ORAL | 0 refills | Status: DC

## 2024-06-03 MED ORDER — AMOXICILLIN-POT CLAVULANATE 875-125 MG PO TABS: 1.0000 | ORAL_TABLET | Freq: Two times a day (BID) | ORAL | 0 refills | Status: DC

## 2024-06-03 MED ORDER — PREDNISONE 10 MG PO TABS: ORAL_TABLET | ORAL | 0 refills | Status: DC

## 2024-06-03 MED ORDER — BREZTRI AEROSPHERE 160-9-4.8 MCG/ACT IN AERO: INHALATION_SPRAY | RESPIRATORY_TRACT | Status: AC

## 2024-06-03 NOTE — Progress Notes (Signed)
 @Patient  ID: Andre Turner, male    DOB: 08-Apr-1961, 63 y.o.   MRN: [id number]  No chief complaint on file.   Referring provider: Sherre Geni LABOR, NP  HPI: Andre Turner, Andre Turner is a 63 y/o male with PMH of asthma, allergic rhinitis, restrictive lung disease, HTN, and RA who presents for complaint of cough.  He reports that about 5 weeks ago he presented with a complaint of a productive cough and was treated with oral antibiotics and steroids.  He continued to have an issue with cough therefore was treated with a second round of steroids but no antibiotics.  He initially felt relief and was seen by his primary care for follow-up and given a clean bill of health.  Shortly thereafter he developed a recurrence of his cough now associated with green and brown sputum.  He also reports shortness of breath with activity.  He denies any fevers but reports he has not taken his temperature at home.  He denies any chills or sweats.  He reports that his cough is currently affecting his ability to do his normal ADLs.  He denies chest pain, fever, chills, night sweats, headache, dizziness.  TEST/EVENTS :   Allergies  Allergen Reactions   Lisinopril-Hydrochlorothiazide Shortness Of Breath   Meloxicam Anaphylaxis   Metoprolol  Anaphylaxis   Celebrex [Celecoxib] Itching   Duloxetine Diarrhea    Other Reaction(s): GI Intolerance, Other (See Comments)  REFLUX SEVERE   Percocet [Oxycodone -Acetaminophen ]    Sulfa Antibiotics Nausea And Vomiting and Itching   Sulfonamide Derivatives Hives    REACTION: causes nausea , itchy   Tetracycline Nausea Only   Latex Rash    Immunization History  Administered Date(s) Administered   Tdap 02/19/2007    Past Medical History:  Diagnosis Date   Allergic rhinitis    Allergy 2022   Congestion when sleeping   Anemia    Arthritis    rhuematoid and osteo arthritis   Cataract    Chronic headache    migraines   Diverticulitis    GERD  (gastroesophageal reflux disease)    History of benign prostatic hypertrophy    Hyperlipidemia    Hypertension    Restless leg    Tricuspid regurgitation    Unspecified asthma(493.90)     Tobacco History: Social History   Tobacco Use  Smoking Status Former   Current packs/day: 3.00   Average packs/day: 3.0 packs/day for 1 year (3.0 ttl pk-yrs)   Types: Cigarettes   Passive exposure: Past  Smokeless Tobacco Never  Tobacco Comments   stopped at age 39   Counseling given: Not Answered Tobacco comments: stopped at age 67   Outpatient Medications Prior to Visit  Medication Sig Dispense Refill   allopurinol (ZYLOPRIM) 100 MG tablet Take 100 mg by mouth daily. (Patient taking differently: Take 200 mg by mouth daily.)     amLODipine  (NORVASC ) 5 MG tablet Take 5 mg by mouth daily.     B Complex-C (B-COMPLEX WITH VITAMIN C) tablet Take 1 tablet by mouth daily.     diphenoxylate-atropine (LOMOTIL) 2.5-0.025 MG tablet Take 1 tablet by mouth daily.     fenofibrate (TRICOR) 48 MG tablet Take 1 tablet by mouth daily.     gabapentin (NEURONTIN) 100 MG capsule Take 200 mg by mouth 2 (two) times daily.     glucosamine-chondroitin 500-400 MG tablet Take by mouth.     HYDROcodone -acetaminophen  (NORCO) 10-325 MG tablet Take 1 tablet by mouth as needed.  Iron-FA-B Cmp-C-Biot-Probiotic (FUSION PLUS) CAPS Take 1 capsule by mouth daily.     losartan  (COZAAR ) 100 MG tablet TAKE 1 TABLET(100 MG) BY MOUTH DAILY 90 tablet 2   NON FORMULARY Cod liver Oil with Vit D - 1000 mg.     NON FORMULARY Prostate Optimizer     rosuvastatin (CRESTOR) 20 MG tablet Take 10 mg by mouth daily.      UNABLE TO FIND Take 2 tablets by mouth daily. Med Name: muira puama.     albuterol  (PROAIR  HFA) 108 (90 Base) MCG/ACT inhaler ProAir  HFA 90 mcg/actuation aerosol inhaler  INHALE 1 TO 2 PUFFS EVERY 4 TO 6 HOURS AS NEEDED FOR WHEEZING 8 g 0   budesonide -glycopyrrolate -formoterol  (BREZTRI  AEROSPHERE) 160-9-4.8 MCG/ACT AERO  inhaler 4 samples (Patient taking differently: as needed. 4 samples)     azithromycin  (ZITHROMAX ) 250 MG tablet Take by mouth. (Patient not taking: Reported on 06/03/2024)     levofloxacin  (LEVAQUIN ) 750 MG tablet Take 750 mg by mouth daily. (Patient not taking: Reported on 06/03/2024)     Specialty Vitamins Products (PROSTATE PO) Take 1 tablet by mouth daily. (Patient not taking: Reported on 06/03/2024)     budesonide -formoterol  (SYMBICORT ) 160-4.5 MCG/ACT inhaler Inhale 2 puffs into the lungs in the morning and at bedtime. Rinse and gargle after each use. (Patient not taking: Reported on 06/03/2024) 1 each 12   No facility-administered medications prior to visit.     Review of Systems: Positive as noted in HPI.  Constitutional:   No  weight loss, night sweats,  Fevers, chills, fatigue, or  lassitude.  HEENT:   No headaches,  Difficulty swallowing,  Tooth/dental problems, or  Sore throat,                No sneezing, itching, ear ache, nasal congestion, post nasal drip,   CV:  No chest pain,  Orthopnea, PND, swelling in lower extremities, anasarca, dizziness, palpitations, syncope.   GI  No heartburn, indigestion, abdominal pain, nausea, vomiting, diarrhea, change in bowel habits, loss of appetite, bloody stools.   Resp: No shortness of breath with exertion or at rest.  Positive for productive cough of green and brown sputum.  No non-productive cough,  No coughing up of blood.  No change in color of mucus.  No wheezing.  No chest wall deformity  Skin: no rash or lesions.  GU: no dysuria, change in color of urine, no urgency or frequency.  No flank pain, no hematuria   MS:  No joint pain or swelling.  No decreased range of motion.  No back pain.    Physical Exam  BP (!) 158/76   Pulse 94   Temp 98.8 F (37.1 C) (Temporal)   Ht 5' 8 (1.727 m)   Wt 269 lb 3.2 oz (122.1 kg)   SpO2 96%   BMI 40.93 kg/m   GEN: A/Ox3; pleasant , appears short of breath and coughs frequently  throughout the exam.  Mildly diaphoretic.   HEENT:  [place]/AT,  EACs-clear, TMs-wnl, NOSE-clear, THROAT-clear, no lesions, no postnasal drip or exudate noted.   NECK:  Supple w/ fair ROM; no JVD; normal carotid impulses w/o bruits; no thyromegaly or nodules palpated; no lymphadenopathy.    RESP coughs throughout the exam; cough is loose.  Lungs with scattered rhonchi and expiratory wheezes throughout.  No focal area with dullness.  No accessory muscle use, no dullness to percussion  CARD:  RRR, no m/r/g, no peripheral edema, pulses intact, no cyanosis or clubbing.  GI:   Soft & nt; nml bowel sounds; no organomegaly or masses detected.   Musco: Warm bil, no deformities or joint swelling noted.   Neuro: alert, no focal deficits noted.    Skin: Warm mildly diaphoretic, no lesions or rashes    Lab Results:  CBC    Component Value Date/Time   WBC 8.3 05/09/2024 1545   RBC 3.93 (L) 05/09/2024 1545   HGB 12.3 (L) 05/09/2024 1545   HGB 14.2 01/16/2024 1458   HGB 13.1 12/08/2023 1517   HCT 36.7 (L) 05/09/2024 1545   HCT 38.2 12/08/2023 1517   PLT 308 05/09/2024 1545   PLT 250 01/16/2024 1458   PLT 285 12/08/2023 1517   MCV 93.4 05/09/2024 1545   MCV 95 12/08/2023 1517   MCH 31.3 05/09/2024 1545   MCHC 33.5 05/09/2024 1545   RDW 13.4 05/09/2024 1545   RDW 12.1 12/08/2023 1517   LYMPHSABS 6.2 (H) 01/16/2024 1458   MONOABS 0.6 01/16/2024 1458   EOSABS 241 05/09/2024 1545   BASOSABS 58 05/09/2024 1545    BMET    Component Value Date/Time   NA 138 05/09/2024 1545   NA 139 12/08/2023 1517   K 4.5 05/09/2024 1545   CL 102 05/09/2024 1545   CO2 24 05/09/2024 1545   GLUCOSE 93 05/09/2024 1545   BUN 22 05/09/2024 1545   BUN 24 12/08/2023 1517   CREATININE 0.93 05/09/2024 1545   CALCIUM 9.1 05/09/2024 1545   GFRNONAA >60 01/16/2024 1458   GFRAA >60 07/13/2020 2050    BNP    Component Value Date/Time   BNP 54.2 07/13/2020 2050    ProBNP    Component Value Date/Time    PROBNP 21 05/27/2020 0906    Imaging: No results found.  Administration History     None          Latest Ref Rng & Units 08/14/2020   12:01 PM  PFT Results  FVC-Pre L 3.11   FVC-Predicted Pre % 72   FVC-Post L 3.08   FVC-Predicted Post % 71   Pre FEV1/FVC % % 84   Post FEV1/FCV % % 87   FEV1-Pre L 2.62   FEV1-Predicted Pre % 80   FEV1-Post L 2.69   DLCO uncorrected ml/min/mmHg 23.84   DLCO UNC% % 94   DLCO corrected ml/min/mmHg 25.31   DLCO COR %Predicted % 100   DLVA Predicted % 113   TLC L 5.29   TLC % Predicted % 82   RV % Predicted % 97     Lab Results  Component Value Date   NITRICOXIDE 12 08/14/2020     Assessment & Plan:   Assessment & Plan Moderate persistent asthma with exacerbation Given the quick recurrence of symptoms and continued productive cough suspect that this is an untreated bacterial infection. -  Plan for sputum culture to identify organisms and/or resistance -  Plan for treatment of exacerbation with course of oral steroids, oral Augmentin  and Z-Pak -  Symptom management with Tessalon  Perles and Mucinex  -  Refill sent in for Breztri  and albuterol  -  Patient educated to take Breztri  2 puffs inhaled twice daily.  Notably the patient had been taking this once daily.   Return in about 4 weeks (around 07/01/2024).  Candis Dandy, PA-C 06/03/2024

## 2024-06-03 NOTE — Patient Instructions (Signed)
 Continue Breztri  2 puffs inhaled twice daily.  Take Augmentin , Zithromax , and Prednisone  as prescribed and until finished  Use Mucinex  twice daily to help thin secretions  Use Tessalon  perles as needed for help with cough.  Return to clinic in 4 weeks for follow up; return sooner if new or worsening symptoms.   Complete sputum sample.

## 2024-06-05 LAB — RESPIRATORY CULTURE OR RESPIRATORY AND SPUTUM CULTURE: MICRO NUMBER:: 16884321

## 2024-06-11 ENCOUNTER — Ambulatory Visit: Payer: Self-pay

## 2024-06-11 NOTE — Progress Notes (Signed)
 HPI: FU CAD, hypertension and hyperlipidemia. Cardiac catheterization October 2012 revealed normal LV function and 20% right coronary artery. Coronary CTA September 2021 showed calcium score 54 and minimal nonobstructive coronary disease. 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 Chest x-ray April 2022 showed no active cardiopulmonary disease. Venous Dopplers 4/23 showed no evidence of DVT. CTA 4/23 showed no pulmonary embolus.  Echocardiogram April 2025 showed normal LV function, mild left ventricular hypertrophy.  Monitor April 2025 showed sinus rhythm with short runs of SVT, rare PAC and PVC.  Since last seen the patient has dyspnea with more extreme activities but not with routine activities. It is relieved with rest. It is not associated with chest pain. There is no orthopnea, PND or pedal edema. There is no syncope or palpitations. There is no exertional chest pain.   Current Outpatient Medications  Medication Sig Dispense Refill   albuterol  (PROAIR  HFA) 108 (90 Base) MCG/ACT inhaler ProAir  HFA 90 mcg/actuation aerosol inhaler  INHALE 1 TO 2 PUFFS EVERY 4 TO 6 HOURS AS NEEDED FOR WHEEZING 8 g 0   allopurinol (ZYLOPRIM) 100 MG tablet Take 100 mg by mouth daily. (Patient taking differently: Take 200 mg by mouth daily.)     amLODipine  (NORVASC ) 5 MG tablet Take 5 mg by mouth daily.     B Complex-C (B-COMPLEX WITH VITAMIN C) tablet Take 1 tablet by mouth daily.     budesonide -glycopyrrolate -formoterol  (BREZTRI  AEROSPHERE) 160-9-4.8 MCG/ACT AERO inhaler 4 samples     diphenoxylate-atropine (LOMOTIL) 2.5-0.025 MG tablet Take 1 tablet by mouth daily.     fenofibrate (TRICOR) 48 MG tablet Take 1 tablet by mouth daily. (Patient taking differently: Take 1 tablet by mouth daily.)     gabapentin (NEURONTIN) 100 MG capsule Take 200 mg by mouth 2 (two) times daily. (Patient taking differently: Take 200 mg by mouth as needed (serve  neuropathy).)     glucosamine-chondroitin 500-400 MG tablet Take by mouth.     guaiFENesin  (MUCINEX ) 600 MG 12 hr tablet Take 1 tablet (600 mg total) by mouth 2 (two) times daily. (Patient taking differently: Take 600 mg by mouth 2 (two) times daily as needed for cough.) 30 tablet 2   HYDROcodone -acetaminophen  (NORCO) 10-325 MG tablet Take 1 tablet by mouth as needed.     Iron-FA-B Cmp-C-Biot-Probiotic (FUSION PLUS) CAPS Take 1 capsule by mouth daily.     losartan  (COZAAR ) 100 MG tablet TAKE 1 TABLET(100 MG) BY MOUTH DAILY 90 tablet 2   NON FORMULARY Cod liver Oil with Vit D - 1000 mg.     NON FORMULARY Prostate Optimizer     rosuvastatin (CRESTOR) 20 MG tablet Take 10 mg by mouth daily.      Specialty Vitamins Products (PROSTATE PO) Take 1 tablet by mouth daily.     UNABLE TO FIND Take 2 tablets by mouth daily. Med Name: muira puama.     amoxicillin -clavulanate (AUGMENTIN ) 875-125 MG tablet Take 1 tablet by mouth 2 (two) times daily. 14 tablet 0   azithromycin  (ZITHROMAX  Z-PAK) 250 MG tablet Take two tabs on day one, then one tab daily until finished 6 tablet 0   azithromycin  (ZITHROMAX ) 250 MG tablet Take by mouth. (Patient not taking: Reported on 06/03/2024)     benzonatate  (TESSALON ) 200 MG capsule Take 1 capsule (200 mg total) by mouth 3 (three) times daily as needed for cough. 30 capsule 1   levofloxacin  (LEVAQUIN ) 750 MG tablet Take 750 mg by mouth daily. (  Patient not taking: Reported on 06/21/2024)     predniSONE  (DELTASONE ) 10 MG tablet 4 tabs for 2 days, then 3 tabs for 2 days, 2 tabs for 2 days, then 1 tab for 2 days, then stop 20 tablet 0   No current facility-administered medications for this visit.     Past Medical History:  Diagnosis Date   Allergic rhinitis    Allergy 2022   Congestion when sleeping   Anemia    Arthritis    rhuematoid and osteo arthritis   Cataract    Chronic headache    migraines   Diverticulitis    GERD (gastroesophageal reflux disease)    History  of benign prostatic hypertrophy    Hyperlipidemia    Hypertension    Restless leg    Tricuspid regurgitation    Unspecified asthma(493.90)     Past Surgical History:  Procedure Laterality Date   COLONOSCOPY     INGUINAL HERNIA REPAIR     KNEE ARTHROSCOPY Right 11/18/2022   PATELLA-FEMORAL ARTHROPLASTY Left 07/14/2016   Procedure: LEFT KNEE PATELLA-FEMORAL  REPLACEMENT ARTHROPLASTY;  Surgeon: Toribio JULIANNA Chancy, MD;  Location: [facility];  Service: Orthopedics;  Laterality: Left;   REPLACEMENT TOTAL KNEE Left 01/24/2022   REPLACEMENT TOTAL KNEE Right 02/21/2024   TENDON REPAIR     TONSILLECTOMY      Social History   Socioeconomic History   Marital status: Single    Spouse name: Not on file   Number of children: 0   Years of education: Not on file   Highest education level: Not on file  Occupational History   Not on file  Tobacco Use   Smoking status: Former    Current packs/day: 3.00    Average packs/day: 3.0 packs/day for 1 year (3.0 ttl pk-yrs)    Types: Cigarettes    Passive exposure: Past   Smokeless tobacco: Never   Tobacco comments:    stopped at age 55  Vaping Use   Vaping status: Never Used  Substance and Sexual Activity   Alcohol use: Yes    Alcohol/week: 9.0 standard drinks of alcohol    Types: 5 Cans of beer, 4 Shots of liquor per week    Comment: 1 drink per day   Drug use: No   Sexual activity: Never  Other Topics Concern   Not on file  Social History Narrative   HSG, working on college degree      single. Was employed at [employer] until December.         Social Drivers of Corporate investment banker Strain: Not on file  Food Insecurity: Low Risk  (02/21/2024)   Received from [facility]   Hunger Vital Sign    Within the past 12 months, you worried that your food would run out before you got money to buy more: Never true    Within the past 12 months, the food you bought just didn't last and you didn't have money to get more. : Never  true  Transportation Needs: No Transportation Needs (02/21/2024)   Received from [facility]    In the past 12 months, has lack of reliable transportation kept you from medical appointments, meetings, work or from getting things needed for daily living? : No  Physical Activity: Not on file  Stress: Not on file  Social Connections: Unknown (02/22/2022)   Received from [facility]   Social Network    Social Network: Not on file  Intimate  Partner Violence: Unknown (01/11/2022)   Received from [facility]   HITS    Physically Hurt: Not on file    Insult or Talk Down To: Not on file    Threaten Physical Harm: Not on file    Scream or Curse: Not on file    Family History  Problem Relation Age of Onset   Colon cancer Mother    Lung cancer Mother    Obesity Mother    Prostate cancer Father    Hypertension Father    Heart failure Father    Leukemia Father    Varicose Veins Sister    Colon cancer Maternal Grandmother    Diabetes Other        FH- maternal side   Prostate cancer Other        FH-maternal side   Esophageal cancer Neg Hx    Rectal cancer Neg Hx    Stomach cancer Neg Hx     ROS: no fevers or chills, productive cough, hemoptysis, dysphasia, odynophagia, melena, hematochezia, dysuria, hematuria, rash, seizure activity, orthopnea, PND, pedal edema, claudication. Remaining systems are negative.  Physical Exam: Well-developed well-nourished in no acute distress.  Skin is warm and dry.  HEENT is normal.  Neck is supple.  Chest is clear to auscultation with normal expansion.  Cardiovascular exam is regular rate and rhythm.  Abdominal exam nontender or distended. No masses palpated. Extremities show no edema. neuro grossly intact   A/P  1 coronary artery disease-mild on previous coronary CTA.  Continue medical therapy with aspirin and statin.  2 dyspnea-patient has had an extensive previous evaluation.  It is felt that his dyspnea is likely  secondary to restrictive disease and weight.  3 hyperlipidemia-continue statin.  He did not tolerate higher doses of Crestor due to myalgias.  Lipids and liver monitored by primary care.  4 hypertension-patient's blood pressure is controlled.  Continue present medications.  5 history of palpitations-previous monitor showed no significant arrhythmias.  Symptoms have improved.  Will consider addition of low-dose beta-blocker in the future if necessary.  Redell Shallow, MD

## 2024-06-21 ENCOUNTER — Encounter: Payer: Self-pay | Admitting: Cardiology

## 2024-06-21 ENCOUNTER — Ambulatory Visit: Attending: Cardiology | Admitting: Cardiology

## 2024-06-21 VITALS — BP 128/68 | HR 78 | Ht 68.0 in | Wt 262.8 lb

## 2024-06-21 DIAGNOSIS — E7800 Pure hypercholesterolemia, unspecified: Secondary | ICD-10-CM | POA: Insufficient documentation

## 2024-06-21 DIAGNOSIS — I10 Essential (primary) hypertension: Secondary | ICD-10-CM | POA: Insufficient documentation

## 2024-06-21 DIAGNOSIS — I2510 Atherosclerotic heart disease of native coronary artery without angina pectoris: Secondary | ICD-10-CM | POA: Diagnosis present

## 2024-06-21 DIAGNOSIS — R002 Palpitations: Secondary | ICD-10-CM | POA: Insufficient documentation

## 2024-06-21 NOTE — Patient Instructions (Signed)

## 2024-06-27 ENCOUNTER — Ambulatory Visit: Admitting: Pulmonary Disease

## 2024-06-27 ENCOUNTER — Encounter: Payer: Self-pay | Admitting: Pulmonary Disease

## 2024-06-27 VITALS — BP 130/74 | Temp 98.2°F | Ht 68.0 in | Wt 266.0 lb

## 2024-06-27 DIAGNOSIS — J4550 Severe persistent asthma, uncomplicated: Secondary | ICD-10-CM

## 2024-06-27 DIAGNOSIS — J42 Unspecified chronic bronchitis: Secondary | ICD-10-CM

## 2024-06-27 MED ORDER — BREZTRI AEROSPHERE 160-9-4.8 MCG/ACT IN AERO: 2.0000 | INHALATION_SPRAY | Freq: Two times a day (BID) | RESPIRATORY_TRACT | 4 refills | Status: DC

## 2024-06-27 MED ORDER — BREZTRI AEROSPHERE 160-9-4.8 MCG/ACT IN AERO: 2.0000 | INHALATION_SPRAY | Freq: Two times a day (BID) | RESPIRATORY_TRACT | Status: DC

## 2024-06-27 NOTE — Addendum Note (Signed)
 Addended byBETHA ANNELLA COUGH on: 06/27/2024 04:28 PM   Modules accepted: Orders

## 2024-06-27 NOTE — Progress Notes (Signed)
 @Patient  ID: Andre Turner, male    DOB: [birth year], 63 y.o.   MRN: [id number]  Chief Complaint  Patient presents with   Asthma    Referring provider: Sherre Geni LABOR, NP  HPI:   63 y.o. man history of asthma whom are seeing in follow-up after prolonged exacerbation of underlying asthma and chronic bronchitis.  Most recent pulmonary note via PA reviewed.  Seen by our clinic a month ago.  Additional prednisone  sent.  Stressed importance of Breztri  2 puff twice daily.  Unfortunately is too expensive.  Nursing is samples.  It certainly helps.  Using about 1 puff once a day.  Sometimes more for symptomatic.  Finds it to be beneficial.  We discussed getting this more reliably, to help with cost manufacturing assistance today.  Otherwise I think you are on the right track.  Questionaires / Pulmonary Flowsheets:   ACT:  Asthma Control Test ACT Total Score  06/27/2024  3:20 PM 13    MMRC:     No data to display          Epworth:      No data to display          Tests:   FENO:  Lab Results  Component Value Date   NITRICOXIDE 12 08/14/2020    PFT:    Latest Ref Rng & Units 08/14/2020   12:01 PM  PFT Results  FVC-Pre L 3.11   FVC-Predicted Pre % 72   FVC-Post L 3.08   FVC-Predicted Post % 71   Pre FEV1/FVC % % 84   Post FEV1/FCV % % 87   FEV1-Pre L 2.62   FEV1-Predicted Pre % 80   FEV1-Post L 2.69   DLCO uncorrected ml/min/mmHg 23.84   DLCO UNC% % 94   DLCO corrected ml/min/mmHg 25.31   DLCO COR %Predicted % 100   DLVA Predicted % 113   TLC L 5.29   TLC % Predicted % 82   RV % Predicted % 97   Personally reviewed and interpreted as: spirometry within normal limits, lung volumes within normal limits, DLCO within normal limits  WALK:      No data to display          Imaging: Personally reviewed and as per EMR and discussion in this note No results found.   Lab Results: Personally reviewed CBC    Component Value Date/Time   WBC 8.3  05/09/2024 1545   RBC 3.93 (L) 05/09/2024 1545   HGB 12.3 (L) 05/09/2024 1545   HGB 14.2 01/16/2024 1458   HGB 13.1 12/08/2023 1517   HCT 36.7 (L) 05/09/2024 1545   HCT 38.2 12/08/2023 1517   PLT 308 05/09/2024 1545   PLT 250 01/16/2024 1458   PLT 285 12/08/2023 1517   MCV 93.4 05/09/2024 1545   MCV 95 12/08/2023 1517   MCH 31.3 05/09/2024 1545   MCHC 33.5 05/09/2024 1545   RDW 13.4 05/09/2024 1545   RDW 12.1 12/08/2023 1517   LYMPHSABS 6.2 (H) 01/16/2024 1458   MONOABS 0.6 01/16/2024 1458   EOSABS 241 05/09/2024 1545   BASOSABS 58 05/09/2024 1545    BMET    Component Value Date/Time   NA 138 05/09/2024 1545   NA 139 12/08/2023 1517   K 4.5 05/09/2024 1545   CL 102 05/09/2024 1545   CO2 24 05/09/2024 1545   GLUCOSE 93 05/09/2024 1545   BUN 22 05/09/2024 1545   BUN 24 12/08/2023 1517   CREATININE 0.93 05/09/2024  1545   CALCIUM 9.1 05/09/2024 1545   GFRNONAA >60 01/16/2024 1458   GFRAA >60 07/13/2020 2050    BNP    Component Value Date/Time   BNP 54.2 07/13/2020 2050    ProBNP    Component Value Date/Time   PROBNP 21 05/27/2020 0906    Specialty Problems       Pulmonary Problems   Allergic rhinitis   Qualifier: Diagnosis of  By: Nickola CMA, [place]   IMO SNOMED Dx Update Oct 2024      Mild intermittent asthma   Qualifier: History of  By: Nickola CMA, [place]        Shortness of breath   Restrictive lung disease   Snoring    Allergies  Allergen Reactions   Lisinopril-Hydrochlorothiazide Shortness Of Breath   Meloxicam Anaphylaxis   Metoprolol  Anaphylaxis   Celebrex [Celecoxib] Itching   Duloxetine Diarrhea    Other Reaction(s): GI Intolerance, Other (See Comments)  REFLUX SEVERE   Percocet [Oxycodone -Acetaminophen ]    Sulfa Antibiotics Nausea And Vomiting and Itching   Sulfonamide Derivatives Hives    REACTION: causes nausea , itchy   Tetracycline Nausea Only   Latex Rash    Immunization History  Administered Date(s) Administered    Tdap 02/19/2007    Past Medical History:  Diagnosis Date   Allergic rhinitis    Allergy 2022   Congestion when sleeping   Anemia    Arthritis    rhuematoid and osteo arthritis   Cataract    Chronic headache    migraines   Diverticulitis    GERD (gastroesophageal reflux disease)    History of benign prostatic hypertrophy    Hyperlipidemia    Hypertension    Restless leg    Tricuspid regurgitation    Unspecified asthma(493.90)     Tobacco History: Social History   Tobacco Use  Smoking Status Former   Current packs/day: 3.00   Average packs/day: 3.0 packs/day for 1 year (3.0 ttl pk-yrs)   Types: Cigarettes   Passive exposure: Past  Smokeless Tobacco Never  Tobacco Comments   stopped at age 22   Counseling given: Not Answered Tobacco comments: stopped at age 68   Continue to not smoke  Outpatient Encounter Medications as of 06/27/2024  Medication Sig   albuterol  (PROAIR  HFA) 108 (90 Base) MCG/ACT inhaler ProAir  HFA 90 mcg/actuation aerosol inhaler  INHALE 1 TO 2 PUFFS EVERY 4 TO 6 HOURS AS NEEDED FOR WHEEZING   allopurinol (ZYLOPRIM) 100 MG tablet Take 100 mg by mouth daily. (Patient taking differently: Take 200 mg by mouth daily.)   amLODipine  (NORVASC ) 5 MG tablet Take 5 mg by mouth daily.   amoxicillin -clavulanate (AUGMENTIN ) 875-125 MG tablet Take 1 tablet by mouth 2 (two) times daily.   azithromycin  (ZITHROMAX  Z-PAK) 250 MG tablet Take two tabs on day one, then one tab daily until finished   azithromycin  (ZITHROMAX ) 250 MG tablet Take by mouth. (Patient not taking: Reported on 06/03/2024)   B Complex-C (B-COMPLEX WITH VITAMIN C) tablet Take 1 tablet by mouth daily.   benzonatate  (TESSALON ) 200 MG capsule Take 1 capsule (200 mg total) by mouth 3 (three) times daily as needed for cough.   budesonide -glycopyrrolate -formoterol  (BREZTRI  AEROSPHERE) 160-9-4.8 MCG/ACT AERO inhaler 4 samples   diphenoxylate-atropine (LOMOTIL) 2.5-0.025 MG tablet Take 1 tablet by mouth  daily.   fenofibrate (TRICOR) 48 MG tablet Take 1 tablet by mouth daily. (Patient taking differently: Take 1 tablet by mouth daily.)   gabapentin (NEURONTIN) 100 MG capsule Take 200  mg by mouth 2 (two) times daily. (Patient taking differently: Take 200 mg by mouth as needed (serve neuropathy).)   glucosamine-chondroitin 500-400 MG tablet Take by mouth.   guaiFENesin  (MUCINEX ) 600 MG 12 hr tablet Take 1 tablet (600 mg total) by mouth 2 (two) times daily. (Patient taking differently: Take 600 mg by mouth 2 (two) times daily as needed for cough.)   HYDROcodone -acetaminophen  (NORCO) 10-325 MG tablet Take 1 tablet by mouth as needed.   Iron-FA-B Cmp-C-Biot-Probiotic (FUSION PLUS) CAPS Take 1 capsule by mouth daily.   levofloxacin  (LEVAQUIN ) 750 MG tablet Take 750 mg by mouth daily. (Patient not taking: Reported on 06/21/2024)   losartan  (COZAAR ) 100 MG tablet TAKE 1 TABLET(100 MG) BY MOUTH DAILY   NON FORMULARY Cod liver Oil with Vit D - 1000 mg.   NON FORMULARY Prostate Optimizer   predniSONE  (DELTASONE ) 10 MG tablet 4 tabs for 2 days, then 3 tabs for 2 days, 2 tabs for 2 days, then 1 tab for 2 days, then stop   rosuvastatin (CRESTOR) 20 MG tablet Take 10 mg by mouth daily.    Specialty Vitamins Products (PROSTATE PO) Take 1 tablet by mouth daily.   UNABLE TO FIND Take 2 tablets by mouth daily. Med Name: muira puama.   No facility-administered encounter medications on file as of 06/27/2024.     Review of Systems  Review of Systems  N/a Physical Exam  BP 130/74 (BP Location: Left Arm, Patient Position: Sitting, Cuff Size: Normal)   Temp 98.2 F (36.8 C) (Oral)   Ht 5' 8 (1.727 m)   Wt 266 lb (120.7 kg)   SpO2 96%   BMI 40.45 kg/m   Wt Readings from Last 5 Encounters:  06/27/24 266 lb (120.7 kg)  06/21/24 262 lb 12.8 oz (119.2 kg)  06/03/24 269 lb 3.2 oz (122.1 kg)  05/09/24 264 lb 9.6 oz (120 kg)  05/01/24 266 lb 6.4 oz (120.8 kg)    BMI Readings from Last 5 Encounters:   06/27/24 40.45 kg/m  06/21/24 39.96 kg/m  06/03/24 40.93 kg/m  05/09/24 40.23 kg/m  05/01/24 40.51 kg/m     Physical Exam General: Sitting in chair, no acute distress Eyes: EOMI, no icterus Neck: Supple, no JVP Pulmonary: Distant, clear, decreased excursion, no wheeze Cardiovascular: Warm no edema Abdomen: Nondistended MSK: No synovitis, no joint effusion Neuro: Normal gait, no weakness Psych: Normal mood, full affect   Assessment & Plan:   Severe persistent asthma/chronic bronchitis: Triggered by viral illness versus pneumonia.  Prolonged or repetitive course of prednisone  over the last few months.  Breztri  is beneficial.  Unable to use as prescribed given cost.  Samples today.  Manufacturing paperwork.  I think things will improve even more increasing to 2 puffs twice a day as should has a postsurgery average 1 puff once a day he is using now.  But he finds it beneficial.   Return in about 3 months (around 09/26/2024) for f/u Dr. Annella.   Donnice JONELLE Annella, MD 06/27/2024

## 2024-06-27 NOTE — Patient Instructions (Signed)
 I am glad the Breztri  is helping  I provided samples today, 2 puffs in the morning, 2 puffs in the evening.  Rinse her mouth out with water after every use  I provided enough samples for 8 weeks  Manufacturing assistance paperwork so we get the Breztri  more reliably, full month supply etc. moving forward  Return to clinic in 3 months or sooner as needed with Dr. Annella or APP

## 2024-07-25 ENCOUNTER — Inpatient Hospital Stay: Attending: Hematology

## 2024-07-25 DIAGNOSIS — M069 Rheumatoid arthritis, unspecified: Secondary | ICD-10-CM | POA: Diagnosis present

## 2024-07-25 DIAGNOSIS — D500 Iron deficiency anemia secondary to blood loss (chronic): Secondary | ICD-10-CM | POA: Diagnosis present

## 2024-07-25 LAB — CBC WITH DIFFERENTIAL/PLATELET
Abs Immature Granulocytes: 0.02 K/uL (ref 0.00–0.07)
Basophils Absolute: 0.1 K/uL (ref 0.0–0.1)
Basophils Relative: 1 %
Eosinophils Absolute: 0.1 K/uL (ref 0.0–0.5)
Eosinophils Relative: 1 %
HCT: 38.3 % — ABNORMAL LOW (ref 39.0–52.0)
Hemoglobin: 13.5 g/dL (ref 13.0–17.0)
Immature Granulocytes: 0 %
Lymphocytes Relative: 49 %
Lymphs Abs: 4.7 K/uL — ABNORMAL HIGH (ref 0.7–4.0)
MCH: 31.7 pg (ref 26.0–34.0)
MCHC: 35.2 g/dL (ref 30.0–36.0)
MCV: 89.9 fL (ref 80.0–100.0)
Monocytes Absolute: 0.8 K/uL (ref 0.1–1.0)
Monocytes Relative: 8 %
Neutro Abs: 4 K/uL (ref 1.7–7.7)
Neutrophils Relative %: 41 %
Platelets: 242 K/uL (ref 150–400)
RBC: 4.26 MIL/uL (ref 4.22–5.81)
RDW: 13.7 % (ref 11.5–15.5)
WBC: 9.7 K/uL (ref 4.0–10.5)
nRBC: 0 % (ref 0.0–0.2)

## 2024-07-26 LAB — FERRITIN: Ferritin: 415 ng/mL — ABNORMAL HIGH (ref 24–336)

## 2024-07-29 ENCOUNTER — Telehealth: Payer: Self-pay

## 2024-07-29 NOTE — Telephone Encounter (Signed)
 LVM for pt stating that Dr. Demetra office was notified of the recent findings of the lung nodules found by [facility].  Stated that Dr. Lanny would like for the pt to f/u with his PCP regarding these findings.  Stated if the pt does not have a PCP to please contact Dr. Demetra office for further directions.

## 2024-07-29 NOTE — Telephone Encounter (Signed)
-----   Message from Onita Mattock sent at 07/29/2024  4:14 PM EDT ----- Regarding: RE: Pt medical update Please call him and recommend him to f/u with his PCP regarding his lung nodule. If he does not have PCP, then schedule a OV with me in next few weeks, thx  Onita ----- Message ----- From: Seena Anders SQUIBB, RN Sent: 07/26/2024   9:39 AM EDT To: Onita Mattock, MD Subject: FW: Pt medical update                           ----- Message ----- From: Andre Turner Sent: 07/25/2024   3:15 PM EDT To: Anders SQUIBB Seena, RN Subject: Pt medical update                              Hello, this pt is currently checking in with me and he said that [facility] found a nodule on his right lung. He wanted to make sure Dr. Mattock is aware of this.

## 2024-08-06 ENCOUNTER — Other Ambulatory Visit: Admitting: Rheumatology

## 2024-08-19 ENCOUNTER — Ambulatory Visit: Payer: Self-pay | Admitting: Hematology

## 2024-08-20 NOTE — Telephone Encounter (Addendum)
 Attempted to contact the patient via telephone call @[phone]. Unable to reach the patient. LVM w/ provider's comments from below.  Also sent patient a MyChart Message with the provider's comments from below.   ----- Message from Onita Mattock sent at 08/19/2024  8:02 AM EST ----- Please let pt know his iron level is slightly high, please stop oral iron and will monitor in future, thx  Onita Mattock  ----- Message ----- From: Rebecka, Lab In [place] Sent: 07/25/2024   3:36 PM EST To: Onita Mattock, MD

## 2024-08-28 NOTE — Unmapped External Note (Signed)
 Physical Therapy Progress Note Report  Payor: MEDICARE / Plan: MEDICARE A&B / Product Type: Medicare /   Demographics:  Age: 63 y.o.  Gender: male  Referring Diagnosis: M17.11 - Primary osteoarthritis of right knee Z47.1, Z96.651 - Aftercare following right knee joint replacement surgery  Referring Clinician:  Verta Emeline Toribio SHAUNNA*  Initial Evaluation Date: 03/13/24  Reporting Period:  06/19/24 to 08/28/24 Number of Visits to Date: 10 Number of Missed Visits: 0    Therapy Diagnosis:     ICD-10-CM   1. Right knee pain, unspecified chronicity  M25.561   2. Decreased ROM of right knee  M25.661   3. Primary osteoarthritis of right knee  M17.11     Rehabilitation Precautions/Restrictions:   Precautions/Restrictions Precautions: TKA performed 02/21/2024, CAD, HTN, History gout, vertigo (unable to lay flat), SOB, restrictive lung disease Restrictions: Unable to lay flat    Interval Medical History: NA  Problem List: Activity tolerance; Edema; Functional limitations; Gait; Joint stiffness; Pain; Patient/family education; Range of motion; Strength   Patient Report:  Patient says he is having some issues with his knee. It is stiff and hurting today. He says it has been swollen over the past week. 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   Change in Status Since Last Visit: No Pain:    5/10 RT knee, stiff, sore; Performed treatment session to tolerance, monitored pain during session.    OBJECTIVE  General Observation:  arrives in NAD    RT knee AROM -1 to 103 deg   RLE MMT 5/5 throughout  Increased tenderness to palpation about RT lower leg and some redness through calf (discussed differentials including DVT and skin infection and to f/u with MD)  Outcomes:  PT Outcome Measures    Flowsheet Row Value  KOOS JR-  Stiffness-Amount of joint stiffness experienced during the last week in your knee   How severe is your knee stiffness  after first wakening in the morning? 1  KOOS JR - Pain-Amount of knee pain experienced last week during the following actiivities   Twisting/pivoting on your knee 1  Straightening knee fully 1  Going up or down stairs 2  Standing upright 1  KOOS JR - Function/Daily Living-Degree of difficulty experienced in the last week due to your knee (ability to move around and look after yourself)   Rising from sitting 0  Bending to floor/pick up an object 2  KOOS JR - KOOS Scoring   KOOS Raw Score 8  KOOS Interval Score 65.99       Interventions:  Therapeutic exercise:  -reassess -objective tests and measures -rec bike lv 3 4 min for mobility and conditioning  -calf stretch on slant board 3 x 30  -supine heel slides 20 x 5  -heel raise x20 -mini squat 2 x 10  Therapeutic activity: -Step up on 6 inch step x 10 with single rail  -step down on 6 inch step x 10 with single rail  -sidestepping with 10lb AW x15 at bar    Education: Yes, as described in interventions    ASSESSMENT Patient presents with increased swelling in RT knee today and increased pain. He Had been making good progress, but shows slightly decreased objective measures today as a result. He does have notable swelling and some redness. His pain is moderate. He is able to walk and perform heel raise without severe calf pain. We discussed differentials including edema, DVT and skin  infection. He endorses having a few open skin lesions currently. I advised him to follow up with his MD as soon as possible for consult. He remains limited by ongoing knee ROM restriction, and decreased activity tolerance. He does show good strength overall. Patient will continue to benefit from skilled therapy services to address impairments and improve LOF with ADLs.   Therapy Program to Date: In summary, the therapy program has included: Patient and/or Caregiver Education, Therapeutic Exercises, Therapeutic Activities, Self Care/Home Management,  Manual Therapy, and Modalities  Summary of Care Provided during this Interval: see   Responses to Treatment Interventions: Additional improvement anticipated within reasonable timeframe  Progress Towards Goals:   Goals Addressed             This Visit's Progress   . PT Goal       Short Term Goals:  Timeline to meet STG's: 10 visits  STG 1)  Patient to report right pain no greater than 1/10 with all activities.   06/19/24: Ongoing (up to 4/10 RT knee pain)  08/28/24: Ongoing (current 5/10)  STG 2)  Patient to be independent with edema and pain management.   06/19/24: Ongoing  08/28/24: Ongoing edema   Long Term Goals:  Timeline to meet LTG's: 20 visits  LTG 1)  Independent HEP. 06/19/24: Ongoing  08/28/24: Ongoing  LTG 2)  Patient to demonstrate improved AROM at right to knee to 0-120 degrees to promote increased independence with sitting and standing for prolonged periods, sleeping 06/19/24: Ongoing (current -5 to 105 deg)  08/28/24: Ongoing (current -1 to 103 deg)  LTG 3)  Patient to demonstrate increased strength at right lower extremity to 5-/5 to promote increased independence with sitting and standing for prolonged periods, sleeping 06/19/24: MET 08/28/24: MET  LTG 4)  Patient to demonstrate improved score on KOOS JR to 75 from 54.84 at evaluation to promote increased independence with ADLS.   06/19/24: Ongoing (current 73%) 08/28/24: Ongoing (current 66%)          PLAN Treatment Frequency and Duration:  PT Frequency: Other (comment) PT Duration: Other (comment) Treatment Plan Details: completed 10 visits as of 08/28/24; continue 1 x week for 6 visits  Recommended PT Treatment/Interventions: Electrical stimulation-unattended (02985); Electrical stimulation-attended (02967); Gait training (02883); Manual therapy (97140); Neuromuscular re-education [phone]); Therapeutic activity (97530); Therapeutic exercise (97110); Vasopneumatic compression (02983)   Recommended  Consults:  None currently  Development of Plan of Care:  Patient participated in plan of care development.  Total Treatment Time (Time & Untimed): Total Treatment Time: 38 Total Time in Timed Codes: Time in Timed Codes: 38     Treatment/Procedures Therapeutic Actvity Direct Pt Contact minutes: 8 Therapeutic Exercises minutes: 30              The patient has been instructed to contact our clinic if any questions or problems should arise.

## 2024-09-10 ENCOUNTER — Ambulatory Visit

## 2024-09-10 ENCOUNTER — Ambulatory Visit: Attending: Rheumatology | Admitting: Rheumatology

## 2024-09-10 DIAGNOSIS — M79641 Pain in right hand: Secondary | ICD-10-CM | POA: Insufficient documentation

## 2024-09-10 DIAGNOSIS — M19042 Primary osteoarthritis, left hand: Secondary | ICD-10-CM

## 2024-09-10 DIAGNOSIS — M79642 Pain in left hand: Secondary | ICD-10-CM

## 2024-09-10 DIAGNOSIS — M19041 Primary osteoarthritis, right hand: Secondary | ICD-10-CM

## 2024-09-10 NOTE — Progress Notes (Signed)
 Visit diagnosis: Pain in both hands  Patient was here to get ultrasound examination of bilateral hands to look for synovitis.  A limited ultrasound examination of bilateral hands was performed per EULAR recommendations. Using 15 MHz transducer, grayscale and power Doppler bilateral second and third MCP joints  both dorsal and volar aspects were evaluated to look for synovitis or tenosynovitis. The findings were there was no synovitis or tenosynovitis on ultrasound examination.   Impression: No synovitis or tenosynovitis was noted on the limited ultrasound examination.  The ultrasound results were discussed with the patient.  Maya Nash, MD

## 2024-09-18 ENCOUNTER — Encounter: Payer: Self-pay | Admitting: Pulmonary Disease

## 2024-09-18 ENCOUNTER — Ambulatory Visit (INDEPENDENT_AMBULATORY_CARE_PROVIDER_SITE_OTHER): Admitting: Pulmonary Disease

## 2024-09-18 VITALS — BP 129/72 | HR 91 | Temp 99.1°F | Ht 68.0 in | Wt 275.0 lb

## 2024-09-18 DIAGNOSIS — J4550 Severe persistent asthma, uncomplicated: Secondary | ICD-10-CM | POA: Diagnosis not present

## 2024-09-18 DIAGNOSIS — R911 Solitary pulmonary nodule: Secondary | ICD-10-CM | POA: Diagnosis not present

## 2024-09-18 DIAGNOSIS — Z87891 Personal history of nicotine dependence: Secondary | ICD-10-CM

## 2024-09-18 DIAGNOSIS — J42 Unspecified chronic bronchitis: Secondary | ICD-10-CM

## 2024-09-18 DIAGNOSIS — J4551 Severe persistent asthma with (acute) exacerbation: Secondary | ICD-10-CM

## 2024-09-18 MED ORDER — AZITHROMYCIN 250 MG PO TABS: ORAL_TABLET | ORAL | 0 refills | Status: AC

## 2024-09-18 MED ORDER — PREDNISONE 20 MG PO TABS: ORAL_TABLET | ORAL | 0 refills | Status: AC

## 2024-09-18 NOTE — Patient Instructions (Signed)
 Neck to see you again  Continue inhalers  Blood work today to consider injection medicines to better treat your breathing issues, bronchitis etc.  Prednisone  and azithromycin  were prescribed, take as prescribed.  Return to clinic in 3 months or sooner as needed with Dr. Annella

## 2024-09-18 NOTE — Progress Notes (Signed)
 @Patient  ID: Gilmore Wonda Lemmings, male    DOB: [birth year], 63 y.o.   MRN: [id number]  Chief Complaint  Patient presents with   Asthma    SOB with exertion, prod cough with clear sputum and wheezing.     Referring provider: [facility] LABOR, NP  HPI:   63 y.o. man history of asthma whom are seeing in follow-up after prolonged exacerbation of underlying asthma and chronic bronchitis.  Most recent pulmonary note via PA reviewed.  Continues to Breztri .  Overall a bit improved.  Now with cough congestion bronchitis symptoms for the last few days.  Not quite gone.  Questionaires / Pulmonary Flowsheets:   ACT:  Asthma Control Test ACT Total Score  06/27/2024  3:20 PM 13    MMRC:     No data to display          Epworth:      No data to display          Tests:   FENO:  Lab Results  Component Value Date   NITRICOXIDE 12 08/14/2020    PFT:    Latest Ref Rng & Units 08/14/2020   12:01 PM  PFT Results  FVC-Pre L 3.11   FVC-Predicted Pre % 72   FVC-Post L 3.08   FVC-Predicted Post % 71   Pre FEV1/FVC % % 84   Post FEV1/FCV % % 87   FEV1-Pre L 2.62   FEV1-Predicted Pre % 80   FEV1-Post L 2.69   DLCO uncorrected ml/min/mmHg 23.84   DLCO UNC% % 94   DLCO corrected ml/min/mmHg 25.31   DLCO COR %Predicted % 100   DLVA Predicted % 113   TLC L 5.29   TLC % Predicted % 82   RV % Predicted % 97   Personally reviewed and interpreted as: spirometry within normal limits, lung volumes within normal limits, DLCO within normal limits  WALK:      No data to display          Imaging: Personally reviewed and as per EMR and discussion in this note US  LIMITED JOINT SPACE STRUCTURES UP BILAT Result Date: 09/10/2024 A limited ultrasound examination of bilateral hands was performed per EULAR recommendations. Using 15 MHz transducer, grayscale and power Doppler bilateral second and third MCP joints  both dorsal and volar aspects were evaluated to look for synovitis or  tenosynovitis. The findings were there was no synovitis or tenosynovitis on ultrasound examination. Impression: No synovitis or tenosynovitis was noted on the limited ultrasound examination.    Lab Results: Personally reviewed CBC    Component Value Date/Time   WBC 9.7 07/25/2024 1518   RBC 4.26 07/25/2024 1518   HGB 13.5 07/25/2024 1518   HGB 14.2 01/16/2024 1458   HGB 13.1 12/08/2023 1517   HCT 38.3 (L) 07/25/2024 1518   HCT 38.2 12/08/2023 1517   PLT 242 07/25/2024 1518   PLT 250 01/16/2024 1458   PLT 285 12/08/2023 1517   MCV 89.9 07/25/2024 1518   MCV 95 12/08/2023 1517   MCH 31.7 07/25/2024 1518   MCHC 35.2 07/25/2024 1518   RDW 13.7 07/25/2024 1518   RDW 12.1 12/08/2023 1517   LYMPHSABS 4.7 (H) 07/25/2024 1518   MONOABS 0.8 07/25/2024 1518   EOSABS 0.1 07/25/2024 1518   BASOSABS 0.1 07/25/2024 1518    BMET    Component Value Date/Time   NA 138 05/09/2024 1545   NA 139 12/08/2023 1517   K 4.5 05/09/2024 1545   CL  102 05/09/2024 1545   CO2 24 05/09/2024 1545   GLUCOSE 93 05/09/2024 1545   BUN 22 05/09/2024 1545   BUN 24 12/08/2023 1517   CREATININE 0.93 05/09/2024 1545   CALCIUM 9.1 05/09/2024 1545   GFRNONAA >60 01/16/2024 1458   GFRAA >60 07/13/2020 2050    BNP    Component Value Date/Time   BNP 54.2 07/13/2020 2050    ProBNP    Component Value Date/Time   PROBNP 21 05/27/2020 0906    Specialty Problems       Pulmonary Problems   Allergic rhinitis   Qualifier: Diagnosis of  By: Nickola CMA, [place]   IMO SNOMED Dx Update Oct 2024      Mild intermittent asthma   Qualifier: History of  By: Nickola CMA, [place]        Shortness of breath   Restrictive lung disease   Snoring    Allergies  Allergen Reactions   Lisinopril-Hydrochlorothiazide Shortness Of Breath   Meloxicam Anaphylaxis   Metoprolol  Anaphylaxis   Celebrex [Celecoxib] Itching   Duloxetine Diarrhea    Other Reaction(s): GI Intolerance, Other (See Comments)  REFLUX  SEVERE   Percocet [Oxycodone -Acetaminophen ]    Sulfa Antibiotics Nausea And Vomiting and Itching   Sulfonamide Derivatives Hives    REACTION: causes nausea , itchy   Tetracycline Nausea Only   Latex Rash    Immunization History  Administered Date(s) Administered   Tdap 02/19/2007    Past Medical History:  Diagnosis Date   Allergic rhinitis    Allergy 2022   Congestion when sleeping   Anemia    Arthritis    rhuematoid and osteo arthritis   Cataract    Chronic headache    migraines   Diverticulitis    GERD (gastroesophageal reflux disease)    History of benign prostatic hypertrophy    Hyperlipidemia    Hypertension    Restless leg    Tricuspid regurgitation    Unspecified asthma(493.90)     Tobacco History: Social History   Tobacco Use  Smoking Status Former   Current packs/day: 3.00   Average packs/day: 3.0 packs/day for 1 year (3.0 ttl pk-yrs)   Types: Cigarettes   Passive exposure: Past  Smokeless Tobacco Never  Tobacco Comments   stopped at age 42   Counseling given: Not Answered Tobacco comments: stopped at age 82   Continue to not smoke  Outpatient Encounter Medications as of 09/18/2024  Medication Sig   albuterol  (PROAIR  HFA) 108 (90 Base) MCG/ACT inhaler ProAir  HFA 90 mcg/actuation aerosol inhaler  INHALE 1 TO 2 PUFFS EVERY 4 TO 6 HOURS AS NEEDED FOR WHEEZING   allopurinol (ZYLOPRIM) 100 MG tablet Take 100 mg by mouth daily.   amLODipine  (NORVASC ) 5 MG tablet Take 5 mg by mouth daily.   azithromycin  (ZITHROMAX ) 250 MG tablet Take 2 tablets (500 mg total) by mouth daily for 1 day, THEN 1 tablet (250 mg total) daily for 4 days.   B Complex-C (B-COMPLEX WITH VITAMIN C) tablet Take 1 tablet by mouth daily.   budesonide -glycopyrrolate -formoterol  (BREZTRI  AEROSPHERE) 160-9-4.8 MCG/ACT AERO inhaler 4 samples   diphenoxylate-atropine (LOMOTIL) 2.5-0.025 MG tablet Take 1 tablet by mouth daily.   fenofibrate (TRICOR) 48 MG tablet Take 1 tablet by mouth  daily.   glucosamine-chondroitin 500-400 MG tablet Take by mouth.   HYDROcodone -acetaminophen  (NORCO) 10-325 MG tablet Take 1 tablet by mouth as needed.   Iron-FA-B Cmp-C-Biot-Probiotic (FUSION PLUS) CAPS Take 1 capsule by mouth daily.   losartan  (COZAAR ) 100  MG tablet TAKE 1 TABLET(100 MG) BY MOUTH DAILY   NON FORMULARY Cod liver Oil with Vit D - 1000 mg.   NON FORMULARY Prostate Optimizer   predniSONE  (DELTASONE ) 20 MG tablet Take 2 tablets (40 mg total) by mouth daily with breakfast for 5 days, THEN 1 tablet (20 mg total) daily with breakfast for 5 days.   rosuvastatin (CRESTOR) 20 MG tablet Take 10 mg by mouth daily.    Specialty Vitamins Products (PROSTATE PO) Take 1 tablet by mouth daily.   benzonatate  (TESSALON ) 200 MG capsule Take 1 capsule (200 mg total) by mouth 3 (three) times daily as needed for cough.   gabapentin (NEURONTIN) 100 MG capsule Take 200 mg by mouth 2 (two) times daily. (Patient taking differently: Take 200 mg by mouth as needed (serve neuropathy).)   UNABLE TO FIND Take 2 tablets by mouth daily. Med Name: muira puama.   [DISCONTINUED] amoxicillin -clavulanate (AUGMENTIN ) 875-125 MG tablet Take 1 tablet by mouth 2 (two) times daily.   [DISCONTINUED] azithromycin  (ZITHROMAX  Z-PAK) 250 MG tablet Take two tabs on day one, then one tab daily until finished   [DISCONTINUED] azithromycin  (ZITHROMAX ) 250 MG tablet Take by mouth. (Patient not taking: Reported on 06/03/2024)   [DISCONTINUED] budesonide -glycopyrrolate -formoterol  (BREZTRI  AEROSPHERE) 160-9-4.8 MCG/ACT AERO inhaler Inhale 2 puffs into the lungs in the morning and at bedtime.   [DISCONTINUED] budesonide -glycopyrrolate -formoterol  (BREZTRI  AEROSPHERE) 160-9-4.8 MCG/ACT AERO inhaler Inhale 2 puffs into the lungs in the morning and at bedtime.   [DISCONTINUED] guaiFENesin  (MUCINEX ) 600 MG 12 hr tablet Take 1 tablet (600 mg total) by mouth 2 (two) times daily. (Patient taking differently: Take 600 mg by mouth 2 (two) times  daily as needed for cough.)   [DISCONTINUED] levofloxacin  (LEVAQUIN ) 750 MG tablet Take 750 mg by mouth daily. (Patient not taking: Reported on 06/21/2024)   [DISCONTINUED] predniSONE  (DELTASONE ) 10 MG tablet 4 tabs for 2 days, then 3 tabs for 2 days, 2 tabs for 2 days, then 1 tab for 2 days, then stop   No facility-administered encounter medications on file as of 09/18/2024.     Review of Systems  Review of Systems  N/a Physical Exam  BP 129/72 (BP Location: Left Arm, Patient Position: Sitting)   Pulse 91   Temp 99.1 F (37.3 C) (Oral)   Ht 5' 8 (1.727 m)   Wt 275 lb (124.7 kg)   SpO2 94%   BMI 41.81 kg/m   Wt Readings from Last 5 Encounters:  09/18/24 275 lb (124.7 kg)  06/27/24 266 lb (120.7 kg)  06/21/24 262 lb 12.8 oz (119.2 kg)  06/03/24 269 lb 3.2 oz (122.1 kg)  05/09/24 264 lb 9.6 oz (120 kg)    BMI Readings from Last 5 Encounters:  09/18/24 41.81 kg/m  06/27/24 40.45 kg/m  06/21/24 39.96 kg/m  06/03/24 40.93 kg/m  05/09/24 40.23 kg/m     Physical Exam General: Sitting in chair, no acute distress Eyes: EOMI, no icterus Neck: Supple, no JVP Pulmonary: Distant, clear, decreased excursion, no wheeze Cardiovascular: Warm no edema Abdomen: Nondistended MSK: No synovitis, no joint effusion Neuro: Normal gait, no weakness Psych: Normal mood, full affect   Assessment & Plan:   Severe persistent asthma/chronic bronchitis: Triggered by viral illness versus pneumonia.  Prolonged or repetitive course of prednisone  over the last year.  Now with adherence to Breztri  2 puffs twice daily.  With ongoing bronchitis symptoms.  Flares.  Prednisone , Z-Pak ordered today.  Blood work for phenotyping, anticipate need to start Biologics in the  future.  Lung nodule: Waxed and waned most recent CT scan 01/2024.  Seen on chest x-ray 04/2024.  Repeat CT scan now ordered.  Return in about 3 months (around 12/17/2024).   Donnice JONELLE Beals, MD 09/18/2024

## 2024-09-18 NOTE — Progress Notes (Signed)
 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 His knee is doing ok, still stiff.  Change in Status Since Last Visit: No  Pain:   2/10 RT knee, stiff; Performed treatment session to tolerance, monitored pain during session.   OBJECTIVE  General Observation/Objective Measures:         No measures taken today        Interventions:  Therapeutic exercise:  -rec bike lv 3 4 min for mobility and conditioning  -calf stretch on slant board 3 x 30  -heel raise x30 -mini squat 2 x 10   Therapeutic activity: -Step up on 8 inch step x 15 with single rail  -step down on 8 inch step x 15 with single rail  -sidestepping on ladder 3 rounds     Education: Yes, as described in interventions   ASSESSMENT Patient responded to session well. Progressed step height and balance with added foam. Patient requires increased verbal cues for for improved form and mechanics with squatting at bar. Patient remains limited by ongoing RT knee ROM. Patient will continue to benefit from skilled therapy services to address impairments and improve LOF with ADLs. Session shortened per patient time constraint.    Therapy Diagnosis:     ICD-10-CM   1. Right knee pain, unspecified chronicity  M25.561   2. Decreased ROM of right knee  M25.661   3. Primary osteoarthritis of right knee  M17.11     Progress Towards Goals:   on track   Goals Addressed             This Visit's Progress    PT Goal       Short Term Goals:  Timeline to meet STG's: 10 visits  STG 1)  Patient to report right pain no greater than 1/10 with all activities.    06/19/24: Ongoing (up to 4/10 RT knee pain)  08/28/24: Ongoing (current 5/10)  STG 2)  Patient to be independent with edema and pain management.   06/19/24: Ongoing  08/28/24: Ongoing edema   Long Term Goals:  Timeline to meet LTG's: 20 visits  LTG 1)  Independent HEP. 06/19/24: Ongoing  08/28/24: Ongoing  LTG 2)  Patient to demonstrate improved AROM at right to knee to 0-120 degrees to promote increased independence with sitting and standing for prolonged periods, sleeping 06/19/24: Ongoing (current -5 to 105 deg)  08/28/24: Ongoing (current -1 to 103 deg)  LTG 3)  Patient to demonstrate increased strength at right lower extremity to 5-/5 to promote increased independence with sitting and standing for prolonged periods, sleeping 06/19/24: MET 08/28/24: MET  LTG 4)  Patient to demonstrate improved score on KOOS JR to 75 from 54.84 at evaluation to promote increased independence with ADLS.   06/19/24: Ongoing (current 73%) 08/28/24: Ongoing (current 66%)         PLAN Treatment Frequency and Duration:  PT Frequency: Other (comment) PT Duration: Other (comment) Treatment Plan Details: completed 10 visits as of 08/28/24; continue 1 x week for 6 visits  Recommended PT Treatment/Interventions: Electrical stimulation-unattended (02985); Electrical stimulation-attended (02967); Gait training (02883); Manual therapy (97140); Neuromuscular  re-education [phone]); Therapeutic activity (97530); Therapeutic exercise (97110); Vasopneumatic compression (02983)   Recommended Consults:  None currently  Development of Plan of Care:  No change in POC.  Total Treatment Time (Time & Untimed): Total Treatment Time: 30 Total Time in Timed Codes: Time in Timed Codes: 30     Treatment/Procedures Therapeutic Actvity Direct Pt Contact minutes: 15 Therapeutic Exercises minutes: 15             The patient has been instructed to contact our clinic if any questions or problems should  arise.

## 2024-09-19 LAB — CBC WITH DIFFERENTIAL/PLATELET
Basophils Absolute: 0.1 K/uL (ref 0.0–0.1)
Basophils Relative: 1.1 % (ref 0.0–3.0)
Eosinophils Absolute: 0.5 K/uL (ref 0.0–0.7)
Eosinophils Relative: 5.9 % — ABNORMAL HIGH (ref 0.0–5.0)
HCT: 40.4 % (ref 39.0–52.0)
Hemoglobin: 13.8 g/dL (ref 13.0–17.0)
Lymphocytes Relative: 46.2 % — ABNORMAL HIGH (ref 12.0–46.0)
Lymphs Abs: 4.2 K/uL — ABNORMAL HIGH (ref 0.7–4.0)
MCHC: 34.2 g/dL (ref 30.0–36.0)
MCV: 95.3 fl (ref 78.0–100.0)
Monocytes Absolute: 0.6 K/uL (ref 0.1–1.0)
Monocytes Relative: 7.1 % (ref 3.0–12.0)
Neutro Abs: 3.6 K/uL (ref 1.4–7.7)
Neutrophils Relative %: 39.7 % — ABNORMAL LOW (ref 43.0–77.0)
Platelets: 251 K/uL (ref 150.0–400.0)
RBC: 4.24 Mil/uL (ref 4.22–5.81)
RDW: 13.4 % (ref 11.5–15.5)
WBC: 9.1 K/uL (ref 4.0–10.5)

## 2024-09-19 LAB — IGE: IgE (Immunoglobulin E), Serum: 203 kU/L — ABNORMAL HIGH (ref <=114)

## 2024-09-21 LAB — ALLERGEN PROFILE, PERENNIAL ALLERGEN IGE
Alternaria Alternata IgE: <0.1 kU/L
Aspergillus Fumigatus IgE: <0.1 kU/L
Aureobasidi Pullulans IgE: <0.1 kU/L
Candida Albicans IgE: <0.1 kU/L
Cat Dander IgE: 0.42 kU/L — AB
Chicken Feathers IgE: <0.1 kU/L
Cladosporium Herbarum IgE: <0.1 kU/L
Cow Dander IgE: 1.01 kU/L — AB
D Farinae IgE: <0.1 kU/L
D Pteronyssinus IgE: <0.1 kU/L
Dog Dander IgE: 1.36 kU/L — AB
Duck Feathers IgE: <0.1 kU/L
Goose Feathers IgE: 0.21 kU/L — AB
Mouse Urine IgE: <0.1 kU/L
Mucor Racemosus IgE: <0.1 kU/L
Penicillium Chrysogen IgE: <0.1 kU/L
Phoma Betae IgE: <0.1 kU/L
Setomelanomma Rostrat: <0.1 kU/L
Stemphylium Herbarum IgE: <0.1 kU/L

## 2024-09-26 ENCOUNTER — Inpatient Hospital Stay: Admission: RE | Admit: 2024-09-26 | Discharge: 2024-09-26 | Attending: Pulmonary Disease | Admitting: Pulmonary Disease

## 2024-09-26 DIAGNOSIS — R911 Solitary pulmonary nodule: Secondary | ICD-10-CM

## 2024-12-18 ENCOUNTER — Ambulatory Visit: Admitting: Pulmonary Disease

## 2025-01-23 ENCOUNTER — Inpatient Hospital Stay: Admitting: Hematology

## 2025-01-23 ENCOUNTER — Inpatient Hospital Stay
